# Patient Record
Sex: Female | Born: 1960 | Race: White | Hispanic: No | Marital: Single | Smoking: Former smoker
Health system: Southern US, Community
[De-identification: ages and names within clinical notes are randomized; demographics above are authoritative.]

## PROBLEM LIST (undated history)

## (undated) DIAGNOSIS — M810 Age-related osteoporosis without current pathological fracture: Secondary | ICD-10-CM

## (undated) DIAGNOSIS — M545 Low back pain: Secondary | ICD-10-CM

## (undated) DIAGNOSIS — T7840XA Allergy, unspecified, initial encounter: Secondary | ICD-10-CM

## (undated) DIAGNOSIS — K5792 Diverticulitis of intestine, part unspecified, without perforation or abscess without bleeding: Secondary | ICD-10-CM

## (undated) DIAGNOSIS — K635 Polyp of colon: Secondary | ICD-10-CM

## (undated) DIAGNOSIS — K589 Irritable bowel syndrome without diarrhea: Secondary | ICD-10-CM

## (undated) DIAGNOSIS — F32A Depression, unspecified: Secondary | ICD-10-CM

## (undated) DIAGNOSIS — F329 Major depressive disorder, single episode, unspecified: Secondary | ICD-10-CM

## (undated) HISTORY — DX: Age-related osteoporosis without current pathological fracture: M81.0

## (undated) HISTORY — PX: DILATION AND CURETTAGE OF UTERUS: SHX78

## (undated) HISTORY — DX: Low back pain: M54.5

## (undated) HISTORY — DX: Diverticulitis of intestine, part unspecified, without perforation or abscess without bleeding: K57.92

## (undated) HISTORY — DX: Depression, unspecified: F32.A

## (undated) HISTORY — DX: Allergy, unspecified, initial encounter: T78.40XA

## (undated) HISTORY — PX: NEUROMA SURGERY: SHX722

## (undated) HISTORY — DX: Polyp of colon: K63.5

## (undated) HISTORY — DX: Major depressive disorder, single episode, unspecified: F32.9

---

## 2007-09-09 ENCOUNTER — Encounter: Payer: Self-pay | Admitting: Family Medicine

## 2007-09-09 LAB — HM PAP SMEAR

## 2008-02-01 ENCOUNTER — Ambulatory Visit: Payer: Self-pay | Admitting: Occupational Medicine

## 2008-02-01 DIAGNOSIS — F329 Major depressive disorder, single episode, unspecified: Secondary | ICD-10-CM | POA: Insufficient documentation

## 2008-06-22 ENCOUNTER — Ambulatory Visit: Payer: Self-pay | Admitting: Occupational Medicine

## 2008-06-22 DIAGNOSIS — S335XXA Sprain of ligaments of lumbar spine, initial encounter: Secondary | ICD-10-CM | POA: Insufficient documentation

## 2008-06-30 ENCOUNTER — Ambulatory Visit: Payer: Self-pay | Admitting: Family Medicine

## 2008-06-30 DIAGNOSIS — J029 Acute pharyngitis, unspecified: Secondary | ICD-10-CM | POA: Insufficient documentation

## 2008-07-01 ENCOUNTER — Encounter: Payer: Self-pay | Admitting: Family Medicine

## 2009-01-27 DIAGNOSIS — D126 Benign neoplasm of colon, unspecified: Secondary | ICD-10-CM | POA: Insufficient documentation

## 2009-01-27 LAB — HM COLONOSCOPY

## 2009-03-26 ENCOUNTER — Ambulatory Visit: Payer: Self-pay | Admitting: Family Medicine

## 2009-03-26 DIAGNOSIS — J111 Influenza due to unidentified influenza virus with other respiratory manifestations: Secondary | ICD-10-CM | POA: Insufficient documentation

## 2009-08-05 ENCOUNTER — Encounter: Payer: Self-pay | Admitting: Family Medicine

## 2009-08-05 LAB — CONVERTED CEMR LAB
ALT: 19 units/L
Creatinine, Ser: 0.71 mg/dL
HCT: 42.6 %
Hemoglobin: 14 g/dL
Potassium: 4.1 meq/L

## 2009-09-11 ENCOUNTER — Ambulatory Visit: Payer: Self-pay | Admitting: Emergency Medicine

## 2009-09-11 DIAGNOSIS — M25579 Pain in unspecified ankle and joints of unspecified foot: Secondary | ICD-10-CM | POA: Insufficient documentation

## 2009-11-16 ENCOUNTER — Ambulatory Visit: Payer: Self-pay | Admitting: Family Medicine

## 2009-11-16 DIAGNOSIS — J309 Allergic rhinitis, unspecified: Secondary | ICD-10-CM | POA: Insufficient documentation

## 2009-12-23 ENCOUNTER — Encounter: Payer: Self-pay | Admitting: Family Medicine

## 2009-12-28 ENCOUNTER — Ambulatory Visit: Payer: Self-pay | Admitting: Family Medicine

## 2010-01-22 ENCOUNTER — Encounter: Payer: Self-pay | Admitting: Family Medicine

## 2010-01-22 ENCOUNTER — Ambulatory Visit
Admission: RE | Admit: 2010-01-22 | Discharge: 2010-01-22 | Payer: Self-pay | Source: Home / Self Care | Admitting: Family Medicine

## 2010-01-22 DIAGNOSIS — R109 Unspecified abdominal pain: Secondary | ICD-10-CM | POA: Insufficient documentation

## 2010-01-22 DIAGNOSIS — Z8719 Personal history of other diseases of the digestive system: Secondary | ICD-10-CM | POA: Insufficient documentation

## 2010-01-22 LAB — CONVERTED CEMR LAB
Bilirubin Urine: NEGATIVE
Glucose, Urine, Semiquant: NEGATIVE
Nitrite: NEGATIVE
Protein, U semiquant: NEGATIVE
WBC Urine, dipstick: NEGATIVE

## 2010-01-24 ENCOUNTER — Encounter: Payer: Self-pay | Admitting: Family Medicine

## 2010-01-25 ENCOUNTER — Telehealth (INDEPENDENT_AMBULATORY_CARE_PROVIDER_SITE_OTHER): Payer: Self-pay | Admitting: *Deleted

## 2010-02-04 ENCOUNTER — Ambulatory Visit: Admission: RE | Admit: 2010-02-04 | Payer: Self-pay | Source: Home / Self Care | Admitting: Family Medicine
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
## 2010-02-15 NOTE — Letter (Signed)
Summary: Out of Work  [facility]  [address]   [place], [place] [address]   Phone: [phone]  Fax: [phone]    March 26, 2009   Employee:  Alphia Kava    To Whom It May Concern:   For Medical reasons, please excuse the above named employee from work 03/29/2009.    If you need additional information, please feel free to contact our office.         Sincerely,    Donna Christen MD

## 2010-02-15 NOTE — Assessment & Plan Note (Signed)
Summary: fecer, chills, cough-greenish, sorethroat x 2 dys rm 1   Vital Signs:  Patient Profile:   50 Years Old Female CC:      Cold & URI symptoms Height:     64 inches Weight:      136 pounds O2 Sat:      100 % O2 treatment:    Room Air Temp:     101.6 degrees F oral Pulse rate:   125 / minute Pulse rhythm:   regular Resp:     16 per minute BP sitting:   131 / 84  (right arm) Cuff size:   regular  Vitals Entered By: Areta Haber CMA (March 26, 2009 6:35 PM)                  Current Allergies: No known allergies History of Present Illness Chief Complaint: Cold & URI symptoms History of Present Illness: Subjective: Patient complains of developing flu-like symptoms 3 days ago.  She had a flu shot earlier. + cough worse at night + left pleuritic pain ? wheezing + nasal congestion ? post-nasal drainage No sinus pain/pressure No itchy/red eyes No earache No hemoptysis + SOB + fever/chills No nausea No vomiting No abdominal pain No diarrhea No skin rashes + fatigue + myalgias + headache Used OTC meds without relief   Current Problems: INFLUENZA LIKE ILLNESS (ICD-487.1) ACUTE PHARYNGITIS (ICD-462) LUMBAR SPRAIN AND STRAIN (ICD-847.2) FAMILY HISTORY DEPRESSION (ICD-V17.0) FAMILY HISTORY OF ALCOHOLISM/ADDICTION (ICD-V61.41) DEPRESSION (ICD-311)   Current Meds TRAZODONE HCL 150 MG TABS (TRAZODONE HCL) 1 tab by mouth once daily WELLBUTRIN XL 300 MG XR24H-TAB (BUPROPION HCL) 1 tab by mouth once daily LEXAPRO 10 MG TABS (ESCITALOPRAM OXALATE) 1 tab by mouth once daily IBUPROFEN 200 MG TABS (IBUPROFEN) 4 or 5 tabs as needed CLARITIN-D 12 HOUR 5-120 MG XR12H-TAB (LORATADINE-PSEUDOEPHEDRINE) as directed TUSSIONEX PENNKINETIC ER 8-10 MG/5ML LQCR (CHLORPHENIRAMINE-HYDROCODONE) 5 cc by mouth hs as needed cough AZITHROMYCIN 250 MG TABS (AZITHROMYCIN) Two tabs by mouth on day 1, then 1 tab daily on days 2 through 5  REVIEW OF SYSTEMS Constitutional Symptoms    Complains of fever, chills, and fatigue.     Denies night sweats, weight loss, and weight gain.  Eyes       Denies change in vision, eye pain, eye discharge, glasses, contact lenses, and eye surgery. Ear/Nose/Throat/Mouth       Complains of frequent runny nose, sinus problems, and sore throat.      Denies hearing loss/aids, change in hearing, ear pain, ear discharge, dizziness, frequent nose bleeds, hoarseness, and tooth pain or bleeding.      Comments: greenish x 2 dys Respiratory       Complains of productive cough.      Denies dry cough, wheezing, shortness of breath, asthma, bronchitis, and emphysema/COPD.  Cardiovascular       Denies murmurs, chest pain, and tires easily with exhertion.    Gastrointestinal       Denies stomach pain, nausea/vomiting, diarrhea, constipation, blood in bowel movements, and indigestion. Genitourniary       Denies painful urination, kidney stones, and loss of urinary control. Neurological       Complains of headaches.      Denies paralysis, seizures, and fainting/blackouts. Musculoskeletal       Denies muscle pain, joint pain, joint stiffness, decreased range of motion, redness, swelling, muscle weakness, and gout.  Skin       Denies bruising, unusual mles/lumps or sores, and hair/skin or nail changes.  Psych       Denies mood changes, temper/anger issues, anxiety/stress, speech problems, depression, and sleep problems.  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  Extraocular movement is intact.  Conjunctivae are not inflamed.  Ears:  Canals normal.  Tympanic membranes normal.   Nose:  Normal septum.  Normal turbinates, mildly congested.   No sinus tenderness present.  Pharynx:  Normal; moist mucous membranes  Neck:  Supple.  No adenopathy is present.  No thyromegaly is present  Lungs:  Clear to auscultation.  Breath sounds are equal.  Heart:  Regular rate and rhythm without murmurs, rubs, or gallops.  Abdomen:  Nontender without masses or hepatosplenomegaly.  Bowel sounds are present.  No CVA or flank tenderness.  Extremities:  No edema.   Chest X-ray negative Assessment New Problems: INFLUENZA LIKE ILLNESS (ICD-487.1)  SUSPECT INFLUENZA  Plan New Medications/Changes: AZITHROMYCIN 250 MG TABS (AZITHROMYCIN) Two tabs by mouth on day 1, then 1 tab daily on days 2 through 5  #6 tabs x 0, 03/26/2009, Donna Christen MD Sandria Senter ER 8-10 MG/5ML LQCR (CHLORPHENIRAMINE-HYDROCODONE) 5 cc by mouth hs as needed cough  #2oz x 0, 03/26/2009, Donna Christen MD  New Orders: T-Chest x-ray, 2 views [[id number]] Est. Patient Level III [66440] Planning Comments:   Begin Z-pack, expectorant/decongestant daytime, cough suppressant at night, rest and increased fluids, Ibuprofen as needed. Continue Nasonex. Follow-up with PCP if not improving.   The patient and/or caregiver has been counseled thoroughly with regard to medications prescribed including dosage, schedule, interactions, rationale for use, and possible side effects and they verbalize understanding.  Diagnoses and expected course of recovery discussed and will return if not improved as expected or if the condition worsens. Patient and/or caregiver verbalized understanding.  Prescriptions: AZITHROMYCIN 250 MG TABS (AZITHROMYCIN) Two tabs by mouth on day 1, then 1 tab daily on days 2 through 5  #6 tabs x 0   Entered  and Authorized by:   Donna Christen MD   Signed by:   Donna Christen MD on 03/26/2009   Method used:   Print then Give to Patient   RxID:   [id number] TUSSIONEX PENNKINETIC ER 8-10 MG/5ML LQCR (CHLORPHENIRAMINE-HYDROCODONE) 5 cc by mouth hs as needed cough  #2oz x 0   Entered and Authorized by:   Donna Christen MD   Signed by:   Donna Christen MD on 03/26/2009   Method used:   Print then Give to Patient   RxID:   [id number]   Patient Instructions: 1)  May use Mucinex D (guaifenesin with decongestant) twice daily for congestion. 2)  Increase fluid intake, rest. 3)  Continue Nasonex 4)  May use Afrin nasal spray (or generic oxymetazoline) twice daily for about 5 days.  Also recommend using saline nasal spray several times daily and/or saline nasal irrigation. 5)  Followup with family doctor if not improving one week.

## 2010-02-15 NOTE — Letter (Signed)
Summary: Depression Questionnaire  Depression Questionnaire   Imported By: Lanelle Bal 11/29/2009 08:16:08  _____________________________________________________________________  External Attachment:    Type:   Image     Comment:   External Document

## 2010-02-15 NOTE — Assessment & Plan Note (Signed)
Summary: NOV depression   Vital Signs:  Patient profile:   50 year old female Height:      64 inches Weight:      140 pounds BMI:     24.12 O2 Sat:      97 % on Room air Temp:     97.8 degrees F oral Pulse rate:   64 / minute BP sitting:   120 / 72  (left arm) Cuff size:   regular  Vitals Entered By: Payton Spark CMA (November 16, 2009 2:59 PM)  O2 Flow:  Room air CC: New to est. Discuss depression and other medical concerns.    Primary Care Provider:  Seymour Bars DO  CC:  New to est. Discuss depression and other medical concerns. .  History of Present Illness: 50 yo WF presents for NOV.  She moved here from [place].  She has a hx of depression.  She is perimenopausal.    She was on Zoloft in her 30s then was on Celexa and then Wellbutrin was added and Celexa was changed to Lexapro.  She is on Trazadone at bedtime.  She feels like she has blunted emotional response, no energy and anhedonia.  Does no exercise.  Declined need for counseling and has no acute stressors.  Denies suicidal ideations.    Current Medications (verified): 1)  Trazodone Hcl 150 Mg Tabs (Trazodone Hcl) .Marland Kitchen.. 1 Tab By Mouth Once Daily 2)  Wellbutrin Xl 300 Mg Xr24h-Tab (Bupropion Hcl) .Marland Kitchen.. 1 Tab By Mouth Once Daily 3)  Lexapro 10 Mg Tabs (Escitalopram Oxalate) .Marland Kitchen.. 1 Tab By Mouth Once Daily 4)  Ibuprofen 200 Mg Tabs (Ibuprofen) .... 4 or 5 Tabs As Needed 5)  Claritin-D 12 Hour 5-120 Mg Xr12h-Tab (Loratadine-Pseudoephedrine) .... As Directed 6)  Nasonex 50 Mcg/act Susp (Mometasone Furoate)  Allergies (verified): 1)  ! 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 Former Smoker Alcohol  use-yes Drug use-no Excercise: none  Review of Systems       no fevers/sweats/weakness, unexplained wt loss/gain, no change in vision, no difficulty hearing, ringing in ears, no hay fever/allergies, no CP/discomfort, no palpitations, no breast lump/nipple discharge, no cough/wheeze, no blood in stool,no  N/V/D, no nocturia, no leaking urine, no unusual vag bleeding, no vaginal/penile discharge, no muscle/joint pain, no rash, no new/changing mole, no HA, no memory loss, no anxiety, no sleep problem, no depression, no unexplained lumps, no easy bruising/bleeding., no concern with sexual function    Physical Exam  General:  alert, well-developed, well-nourished, and well-hydrated.   Head:  normocephalic and atraumatic.   Eyes:  conjunctiva clear; wears glasses Ears:  EACs patent; TMs translucent and gray with good cone of light and bony landmarks.  Nose:  no nasal discharge.   Mouth:  pharynx pink and moist.   Neck:  no masses.   Lungs:  Normal respiratory effort, chest expands symmetrically. Lungs are clear to auscultation, no crackles or wheezes. Heart:  Normal rate and regular rhythm. S1 and S2 normal without gallop, murmur, click, rub or other extra sounds. Extremities:  no LE edema Psych:  good eye contact, not anxious appearing, and flat affect.     Impression & Recommendations:  Problem # 1:  DEPRESSION (ICD-311) PHQ score of 14.  Room for improvement.  Will taper off Lexapro and start on Cymbalta, 30 mg / day samples given for wk 1 then go up to 60 mg/ day.  Call if any problems including suicidal ideations/ worsening depression.  Continue Trazadone and Wellbutrin.  RTC in 6 wks.  Work on increasing exercise.   Her updated medication list for this problem includes:    Trazodone Hcl 150 Mg Tabs (Trazodone hcl) .Marland Kitchen... 1 tab by mouth once daily    Wellbutrin Xl 300 Mg Xr24h-tab (Bupropion hcl) .Marland Kitchen... 1 tab by mouth once daily    Cymbalta 60 Mg Cpep (Duloxetine hcl) .Marland Kitchen... 1 capsule by  mouth daily  Problem # 2:  ALLERGIC RHINITIS (ICD-477.9) [facility].  Previously seen by allergist in [place]. Her updated medication list for this problem includes:    Nasonex 50 Mcg/act Susp (Mometasone furoate)  Orders: Allergy Referral  (Allergy)  Complete Medication List: 1)  Trazodone Hcl 150 Mg Tabs (Trazodone hcl) .Marland Kitchen.. 1 tab by mouth once daily 2)  Wellbutrin Xl 300 Mg Xr24h-tab (Bupropion hcl) .Marland Kitchen.. 1 tab by mouth once daily 3)  Cymbalta 60 Mg Cpep (Duloxetine hcl) .Marland Kitchen.. 1 capsule by mouth daily 4)  Ibuprofen 200 Mg Tabs (Ibuprofen) .... 4 or 5 tabs as needed 5)  Claritin-d 12 Hour 5-120 Mg Xr12h-tab (Loratadine-pseudoephedrine) .... As directed 6)  Nasonex 50 Mcg/act Susp (Mometasone furoate)  Patient Instructions: 1)  Cut Lexapro in 1/2. 2)  Take 1/2 tab once daily x 3 days then STOP. 3)  Once OFF, then start on CYMBALTA 30 mg once daily x 1 wk then go up to 60 mg once daily dose. 4)  Will set you up with [facility] here. 5)  Return for f/u mood/ fasting labs in 6 wks. Prescriptions: CYMBALTA 60 MG CPEP (DULOXETINE HCL) 1 capsule by mouth daily  #30 x 3   Entered and Authorized by:   Seymour Bars DO   Signed by:   Seymour Bars DO on 11/16/2009   Method used:   Electronically to        [facility]. [phone]* (retail)       [address]       [place], [place]  [address]       Ph: [phone]       Fax: [phone]   RxID:   [id number]    Orders Added: 1)  Allergy Referral  [Allergy] 2)  New Patient Level III [phone]

## 2010-02-16 ENCOUNTER — Encounter: Payer: Self-pay | Admitting: Family Medicine

## 2010-02-16 DIAGNOSIS — F411 Generalized anxiety disorder: Secondary | ICD-10-CM | POA: Insufficient documentation

## 2010-02-16 DIAGNOSIS — R8789 Other abnormal findings in specimens from female genital organs: Secondary | ICD-10-CM | POA: Insufficient documentation

## 2010-02-17 NOTE — Assessment & Plan Note (Signed)
Summary: Abd Pain/TM Room 5   Vital Signs:  Patient Profile:   50 Years Old Female CC:      Abdominal Pain and diarrhea x 2 days LMP:     01/22/2010 Height:     64 inches Weight:      140 pounds O2 Sat:      98 % O2 treatment:    Room Air Temp:     98.1 degrees F oral Pulse rate:   73 / minute Pulse rhythm:   regular Resp:     12 per minute BP sitting:   94 / 66  (left arm) Cuff size:   regular  Vitals Entered By: Emilio Math (January 22, 2010 9:45 AM)  Menstrual History: LMP (date): 01/22/2010                  Current Allergies: ! * ENVIRONMENTAL ! ZYRTEC ALLERGY ! * NASONEXHistory of Present Illness Chief Complaint: Abdominal Pain and diarrhea x 2 days History of Present Illness:  Subjective:  Patient has a history of recurring episodes of diverticulitis over the past 14 years (she had a colonoscopy last year that verified; also had polyps).  About 2 weeks ago she had a typical flare-up of abdominal pain that she attributed to diverticulits, and it resolved with symptomatic treatment.  Yesterday she developed recurrent mild abdominal pain radiating to the left back, and had watery diarrhea last night.  No blood in stool.  Over the past 2 weeks she has had diarrhea alternating with constipation.   She has had chills.  She has had mild nausea but no vomiting.  No resp or GU symptoms.  No vaginal discharge.  REVIEW OF SYSTEMS Constitutional Symptoms      Denies fever, chills, night sweats, weight loss, weight gain, and fatigue.  Eyes       Denies change in vision, eye pain, eye discharge, glasses, contact lenses, and eye surgery. Ear/Nose/Throat/Mouth       Denies hearing loss/aids, change in hearing, ear pain, ear discharge, dizziness, frequent runny nose, frequent nose bleeds, sinus problems, sore throat, hoarseness, and tooth pain or bleeding.  Respiratory       Denies dry cough, productive cough, wheezing, shortness of breath, asthma, bronchitis, and emphysema/COPD.   Cardiovascular       Denies murmurs, chest pain, and tires easily with exhertion.    Gastrointestinal       Complains of stomach pain and diarrhea.      Denies nausea/vomiting, constipation, blood in bowel movements, and indigestion. Genitourniary       Denies painful urination, kidney stones, and loss of urinary control. Neurological       Denies paralysis, seizures, and fainting/blackouts. Musculoskeletal       Denies muscle pain, joint pain, joint stiffness, decreased range of motion, redness, swelling, muscle weakness, and gout.  Skin       Denies bruising, unusual mles/lumps or sores, and hair/skin or nail changes.  Psych       Denies mood changes, temper/anger issues, anxiety/stress, speech problems, depression, and sleep problems.  Past History:  Past Medical History: Depression Allergies Diverticulitis, hx of  Past Surgical History: Reviewed history from 11/16/2009 and no changes required. 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 Mother died from ETOHism Father CVA at 52; HTN; AMI at 42 brother healthy sister healthy aunt ? cancer  Social History: Reviewed history from 11/16/2009 and no changes required. 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 Former Smoker Alcohol use-yes Drug use-no Excercise: none   Objective:  Appearance:  Patient appears healthy, stated age, and in no acute distress  Eyes:  Pupils are equal, round, and reactive to light and accomdation.  Extraocular movement is intact.  Conjunctivae are not inflamed.   Pharynx:  Normal, moist mucous membranes  Neck:  Supple.  No adenopathy is present.  No thyromegaly is present  Lungs:  Clear to auscultation.  Breath sounds are equal.  Heart:  Regular rate and rhythm without murmurs, rubs, or gallops.  Abdomen:  Vague tenderness over colon without masses or hepatosplenomegaly.  Bowel sounds are  present.  No CVA or flank tenderness.  Mild tenderness over McBurney's point.  Negative iliopsoas and obdurator tests. Extremities:  No edema.  Pedal pulses are full and equal.  Skin:  No rash  urinalysis (dipstick):  2+ blood CBC:  WBC 3.9, normal diff; Hgb 14.0 Assessment New Problems: ABDOMINAL PAIN (ICD-789.00) DIVERTICULITIS, HX OF (ICD-V12.79)  ALTHOUGH WBC NORMAL, WILL TREAT EMPIRICALLY FOR DIVERTICULITIS  Plan New Medications/Changes: METRONIDAZOLE 500 MG TABS (METRONIDAZOLE) One by mouth q6hr  #28 x 0, 01/22/2010, Donna Christen MD CIPROFLOXACIN HCL 750 MG TABS (CIPROFLOXACIN HCL) One by mouth two times a day  #14 x 0, 01/22/2010, Donna Christen MD  New Orders: CBC [[id number]] Urinalysis [CPT-81003] Est. Patient Level IV [09811] Planning Comments:   Begin clear liquids for 12 to 18 hours then slowly advance diet.  Begin metronidazole and cipro.   Return for worsening symptoms  Follow-up with GI if not improving.   The patient and/or caregiver has been counseled thoroughly with regard to medications prescribed including dosage, schedule, interactions, rationale for use, and possible side effects and they verbalize understanding.  Diagnoses and expected course of recovery discussed and will return if not improved as expected or if the condition worsens. Patient and/or caregiver verbalized understanding.  Prescriptions: METRONIDAZOLE 500 MG TABS (METRONIDAZOLE) One by mouth q6hr  #28 x 0   Entered and Authorized by:   Donna Christen MD   Signed by:   Donna Christen MD on 01/22/2010   Method used:   Print then Give to Patient   RxID:   [id number] CIPROFLOXACIN HCL 750 MG TABS (CIPROFLOXACIN HCL) One by mouth two times a day  #14 x 0   Entered and Authorized by:   Donna Christen MD   Signed by:   Donna Christen MD on 01/22/2010   Method used:   Print then Give to Patient   RxID:   [id number]   Orders Added: 1)  CBC [[id number]] 2)  Urinalysis [CPT-81003] 3)   Est. Patient Level IV [64403]    Laboratory Results   Urine Tests  Date/Time Received: January 22, 2010 10:37 AM  Date/Time Reported: January 22, 2010 10:37 AM   Routine Urinalysis   Color: yellow Appearance: Clear Glucose: negative   (Normal Range: Negative) Bilirubin: negative   (Normal Range: Negative) Ketone: negative   (Normal Range: Negative) Spec. Gravity: 1.020   (Normal Range: 1.003-1.035) Blood: 2+   (Normal Range: Negative) pH: 6.0   (Normal Range: 5.0-8.0) Protein: negative   (Normal Range: Negative) Urobilinogen: 0.2   (Normal Range: 0-1) Nitrite: negative   (Normal Range: Negative) Leukocyte Esterace: negative   (Normal Range: Negative)    Comments: menses began today

## 2010-02-17 NOTE — Consult Note (Signed)
Summary: [employer] [facility] [employer] [facility] [place]   Imported By: Lanelle Bal 01/07/2010 08:44:21  _____________________________________________________________________  External Attachment:    Type:   Image     Comment:   External Document

## 2010-02-17 NOTE — Progress Notes (Signed)
  Phone Note Outgoing Call Call back at [facility] Phone [phone]   Call placed by: Lajean Saver RN,  January 25, 2010 8:48 AM Call placed to: Patient Action Taken: Phone Call Completed Summary of Call: Callback: Patient reports that her abdominal pain has subsided but she is still having diarrhea. She is still taking her antibiotics. I encouraged her to try and eat before takiong the antibiotic. Advance diet slowly though and drink plenty of water. I also suggested she try immodium or pepto. Call us with any further problems.

## 2010-02-17 NOTE — Letter (Signed)
Summary: Handout Printed  Printed Handout:  - Clear Liquid Diet-Brief 

## 2010-02-18 ENCOUNTER — Telehealth (INDEPENDENT_AMBULATORY_CARE_PROVIDER_SITE_OTHER): Payer: Self-pay | Admitting: *Deleted

## 2010-02-21 ENCOUNTER — Encounter: Payer: Self-pay | Admitting: Family Medicine

## 2010-02-21 ENCOUNTER — Ambulatory Visit (INDEPENDENT_AMBULATORY_CARE_PROVIDER_SITE_OTHER): Payer: BC Managed Care – PPO | Admitting: Family Medicine

## 2010-02-21 DIAGNOSIS — R197 Diarrhea, unspecified: Secondary | ICD-10-CM | POA: Insufficient documentation

## 2010-02-21 DIAGNOSIS — Z8719 Personal history of other diseases of the digestive system: Secondary | ICD-10-CM

## 2010-02-22 ENCOUNTER — Encounter: Payer: Self-pay | Admitting: Family Medicine

## 2010-02-22 LAB — CONVERTED CEMR LAB
Albumin: 4.4 g/dL (ref 3.5–5.2)
BUN: 9 mg/dL (ref 6–23)
Calcium: 9.9 mg/dL (ref 8.4–10.5)
Chloride: 105 meq/L (ref 96–112)
Lipase: 26 units/L (ref 0–75)
Lymphocytes Relative: 25 % (ref 12–46)
Lymphs Abs: 1.9 10*3/uL (ref 0.7–4.0)
MCHC: 35.1 g/dL (ref 30.0–36.0)
MCV: 92.4 fL (ref 78.0–100.0)
Monocytes Relative: 7 % (ref 3–12)
Neutrophils Relative %: 66 % (ref 43–77)
Platelets: 318 10*3/uL (ref 150–400)
Potassium: 4.4 meq/L (ref 3.5–5.3)
RBC: 4.6 M/uL (ref 3.87–5.11)
RDW: 13.4 % (ref 11.5–15.5)
Sodium: 143 meq/L (ref 135–145)
TSH: 2.78 microintl units/mL (ref 0.350–4.500)
Total Protein: 6.9 g/dL (ref 6.0–8.3)

## 2010-02-23 NOTE — Miscellaneous (Signed)
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) Added new problem of GENERALIZED ANXIETY DISORDER (ICD-300.02) Added new problem of PAP SMEAR, LGSIL, ABNORMAL (ICD-795.09) Observations: Added new observation of PRIMARY MD: Seymour Bars DO (02/16/2010 10:42) Added new observation of PAP DUE: 09/08/2008 (02/16/2010 10:42) Added new observation of SGPT (ALT): 19 units/L (08/05/2009 10:42) Added new observation of SGOT (AST): 14 units/L (08/05/2009 10:42) Added new observation of CREATININE: 0.71 mg/dL (82/95/6213 08:65) Added new observation of BUN: 11 mg/dL (78/46/9629 52:84) Added new observation of BG RANDOM: 81 mg/dL (13/24/4010 27:25) Added new observation of K SERUM: 4.1 meq/L (08/05/2009 10:42) Added new observation of PLATELETK/UL: 263 K/uL (08/05/2009 10:42) Added new observation of HCT: 42.6 % (08/05/2009 10:42) Added new observation of HGB: 14 g/dL (36/64/4034 74:25) Added new observation of WBC COUNT: 5.5 10*3/microliter (08/05/2009 10:42) Added new observation of COLONOSCOPY: [facility]  diverticulosis tortuous sigmoid colon transverse colon polyps  (01/27/2009 10:45) Added new observation of LAST PAP DAT: 09/09/2007  (09/09/2007 10:44) Added new observation of PAP SMEAR: normal  (09/09/2007 10:44)     PAP Result Date:  09/09/2007 PAP Result:  normal PAP Next Due:  1 yr    Colonoscopy  Procedure date:  01/27/2009  Findings:      [facility]  diverticulosis tortuous sigmoid colon transverse colon polyps   Colonoscopy  Procedure date:  01/27/2009  Findings:      [facility]  diverticulosis tortuous sigmoid colon transverse colon polyps

## 2010-02-28 ENCOUNTER — Encounter: Payer: Self-pay | Admitting: Family Medicine

## 2010-03-03 NOTE — Progress Notes (Signed)
Summary: [facility] Refill  Phone Note Call from Patient Call back at Home Phone [phone]   Caller: Patient Reason for Call: Referral Summary of Call: 2 issues:  1) pt would like referral to GI for stomach issues/abdominal pain. 2) would like refill on trazadone.  Ran med history since 12/1 and pt has not had filled in the last two months. Initial call taken by: Francee Piccolo CMA Duncan Dull),  February 18, 2010 3:12 PM  Follow-up for Phone Call        I have not seen her for Abd pain, UC did.  I said in Dr Rolla Plate note that she already has a GI doc -- does she?    Additional Follow-up for Phone Call Additional follow up Details #1::        Pt has GI doc in [place] and had colon last year.  She would like MD closer to home. Francee Piccolo CMA Duncan Dull)  February 18, 2010 4:24 PM   LM to [facility] at home number--need previous GI MD name, and ? if pt wants to schedule appt. Francee Piccolo CMA Duncan Dull)  February 18, 2010 4:30 PM      Prescriptions: TRAZODONE HCL 150 MG TABS (TRAZODONE HCL) 1 tab by mouth once daily  #30 x 3   Entered and Authorized by:   Seymour Bars DO   Signed by:   Seymour Bars DO on 02/18/2010   Method used:   Electronically to        [facility]. [phone]* (retail)       [address]       [place], [place]  [address]       Ph: [phone]       Fax: [phone]   RxID:   [id number]

## 2010-03-03 NOTE — Assessment & Plan Note (Signed)
Summary: GI issues   Vital Signs:  Patient profile:   50 year old female Height:      64 inches Weight:      139 pounds BMI:     23.95 O2 Sat:      100 % on Room air Temp:     98.2 degrees F oral Pulse rate:   67 / minute BP sitting:   142 / 79  (left arm) Cuff size:   regular  Vitals Entered By: Payton Spark CMA (February 21, 2010 2:56 PM)  O2 Flow:  Room air CC: Epigastric pain, stomach cramps and diarrhea x 1+ months.   Primary Care Provider:  Seymour Bars DO  CC:  Epigastric pain and stomach cramps and diarrhea x 1+ months..  History of Present Illness: 50 yo WF presents for mostly diarrhea this past month.  She had a colonoscopy done at [facility] last year and she had diverticulosis and polyps at the time.  She does not see the GI doc there for f/u.    She was treated for presumed diverticulitis at UC last month with Cipro and Flagyl but it did not help.  She took Some Immodium and some Ibuprofen but this didn't do much.  She has had alternating urgency with her diarrhea.  Her stools have been mostly watery and w/o blood.  She has a lot of diffuse cramping but not much bloating.  Had some fevers and chills.  She lost about 11 lbs during that time.  Her appetitie has been normal.  No nausea associated with the diarrhea.      Allergies: 1)  ! * Environmental 2)  ! Zyrtec Allergy 3)  ! * Nasonex  Physical Exam  General:  alert, well-developed, well-nourished, and well-hydrated.   Head:  normocephalic and atraumatic.   Eyes:  sclera non icteric Mouth:  pharynx pink and moist.   Neck:  no masses.  thyroid slightly TTP but no enlarged Lungs:  Normal respiratory effort, chest expands symmetrically. Lungs are clear to auscultation, no crackles or wheezes. Heart:  Normal rate and regular rhythm. S1 and S2 normal without gallop, murmur, click, rub or other extra sounds. Abdomen:  soft.  hyperactive BS with slight distension.  No HSM.  Neg Murphys sign.  R and L LQs  TTP Extremities:  no LE edema Skin:  color normal.  no jaundice, pallor or diaphoresis Psych:  good eye contact, not anxious appearing, and not depressed appearing.     Impression & Recommendations:  Problem # 1:  DIARRHEA (ICD-787.91) >1 month of diarrhea/ bloating suggests ddx of IBS, gluten intolerance, IBD or lactose intolerance.  Unlikely parasititc given lack of exposure and continued good appetite.  Will check labs today  to rule out other causes for diarrhea.  In the meantime, stick to a bland diet, avoiding dairy and will get her set up to see GI.   Orders: Gastroenterology Referral (GI) T-TSH [phone])  Problem # 2:  DIVERTICULITIS, HX OF (ICD-V12.79) Apparently, she was treated for typical acute diverticulitis in Dec while visiting her brother out of town and was treated again in Jan by UC but did not improve the 2nd time.  She had a low WBC at UC.  Will recheck today.  GI referral made given frequent occurences.  Has not required bowel resection thus far.   Orders: Gastroenterology Referral (GI)  Complete Medication List: 1)  Trazodone Hcl 150 Mg Tabs (Trazodone hcl) .Marland Kitchen.. 1 tab by mouth once daily 2)  Ibuprofen 200 Mg Tabs (Ibuprofen) .... 4 or 5 tabs as needed 3)  Nasonex 50 Mcg/act Susp (Mometasone furoate) 4)  Methocarbamol 500 Mg Tabs (Methocarbamol) .... Prn  Other Orders: T-CBC w/Diff ([id number]) T-Comprehensive Metabolic Panel [phone]) T-Lipase [id number])  Patient Instructions: 1)  Labs today. 2)  Will call you w/ results tomorrow. 3)  Will get you in with [facility] for GI visit. 4)  Stick to a bland diet -- avoid any milk products for now. 5)  Use OTC Kaopectate as needed for diarrhea.   6)  REturn for f/u in 1 month.   Orders Added: 1)  T-CBC w/Diff [[id number]] 2)  T-Comprehensive Metabolic Panel [[id number]] 3)  T-Lipase [[id number]] 4)  Gastroenterology Referral [GI] 5)  T-TSH [[id number]] 6)  Est. Patient Level  III [28315]

## 2010-03-07 ENCOUNTER — Encounter: Payer: Self-pay | Admitting: Family Medicine

## 2010-03-08 ENCOUNTER — Encounter: Payer: Self-pay | Admitting: Family Medicine

## 2010-03-09 NOTE — Letter (Signed)
Summary: [facility]   Imported By: Kassie Mends 03/02/2010 09:44:13  _____________________________________________________________________  External Attachment:    Type:   Image     Comment:   External Document

## 2010-03-13 ENCOUNTER — Encounter: Payer: Self-pay | Admitting: Family Medicine

## 2010-03-23 ENCOUNTER — Ambulatory Visit: Payer: BC Managed Care – PPO | Admitting: Family Medicine

## 2010-03-24 NOTE — Letter (Signed)
Summary: [facility]   [facility]   Imported By: Kassie Mends 03/17/2010 09:06:33  _____________________________________________________________________  External Attachment:    Type:   Image     Comment:   External Document

## 2010-03-24 NOTE — Letter (Signed)
Summary: [facility]  [facility]   Imported By: Kassie Mends 03/17/2010 10:17:03  _____________________________________________________________________  External Attachment:    Type:   Image     Comment:   External Document

## 2010-03-25 ENCOUNTER — Telehealth: Payer: Self-pay | Admitting: Family Medicine

## 2010-03-29 NOTE — Progress Notes (Signed)
Summary: C-Diff questions  Phone Note Call from Patient Call back at Home Phone [phone]   Caller: Patient Summary of Call: Pt calls with questions regarding C-Diff and meds given by "that other doctor".  RC to pt.  She is in a meeting and will need to call me back later.  Advised to call [phone] until 12p, call [place] office after that time.  Pt agreeable. Initial call taken by: Francee Piccolo CMA Duncan Dull),  March 25, 2010 10:10 AM  Follow-up for Phone Call        pt RC, states she called GI doc for refills and does not need refill from our office. Follow-up by: Francee Piccolo CMA Duncan Dull),  March 25, 2010 10:48 AM

## 2010-03-31 ENCOUNTER — Encounter: Payer: Self-pay | Admitting: Family Medicine

## 2010-04-05 NOTE — Letter (Signed)
Summary: Stool Studies Results Letter/[facility]  Stool Studies Results Letter/[facility]   Imported By: Maryln Gottron 03/29/2010 10:52:58  _____________________________________________________________________  External Attachment:    Type:   Image     Comment:   External Document

## 2010-04-11 ENCOUNTER — Encounter: Payer: Self-pay | Admitting: Family Medicine

## 2010-06-23 ENCOUNTER — Other Ambulatory Visit: Payer: Self-pay | Admitting: *Deleted

## 2010-06-23 MED ORDER — MOMETASONE FUROATE 50 MCG/ACT NA SUSP: 2.0000 | Freq: Every day | NASAL | Status: DC

## 2010-06-23 MED ORDER — TRAZODONE HCL 150 MG PO TABS: 150.0000 mg | ORAL_TABLET | Freq: Every day | ORAL | Status: DC

## 2010-07-05 ENCOUNTER — Other Ambulatory Visit (HOSPITAL_COMMUNITY)
Admission: RE | Admit: 2010-07-05 | Discharge: 2010-07-05 | Disposition: A | Discharge status: Home / Self Care | Payer: BC Managed Care – PPO | Source: Ambulatory Visit | Attending: Family Medicine | Admitting: Family Medicine

## 2010-07-05 ENCOUNTER — Ambulatory Visit (INDEPENDENT_AMBULATORY_CARE_PROVIDER_SITE_OTHER): Payer: BC Managed Care – PPO | Admitting: Family Medicine

## 2010-07-05 ENCOUNTER — Encounter: Payer: Self-pay | Admitting: Family Medicine

## 2010-07-05 VITALS — BP 117/79 | HR 68 | Ht 64.0 in | Wt 143.0 lb

## 2010-07-05 DIAGNOSIS — Z13220 Encounter for screening for lipoid disorders: Secondary | ICD-10-CM

## 2010-07-05 DIAGNOSIS — Z1231 Encounter for screening mammogram for malignant neoplasm of breast: Secondary | ICD-10-CM

## 2010-07-05 DIAGNOSIS — Z01419 Encounter for gynecological examination (general) (routine) without abnormal findings: Secondary | ICD-10-CM

## 2010-07-05 NOTE — Progress Notes (Signed)
  Subjective:    Patient ID: Shirley Moody, female    DOB: 1960-08-08, 50 y.o.   MRN: [id number]  HPI  50 yo WF presents for CPE with pap smear.  She has hx of abnormal pap but no dysplasia.  Her last mammogram was > 1 yr ago.  Her Tetanus vaccine is UTD.  Her periods have spaced out with perimenopause.  She was on bioidentical hormones for some time. She did well on them and would like to restart.  She is not a smoker, has no hx of heart dz or stroke.  Mammogram and pap smear are due though. Her LMP was in Feb.  She is not on anything for birth control.  She is married.  No fam hx of colon cancer.  Her dad had a stroke and bypass surgery in his 36s.  She had a colonoscopy in 2011 and routinely follows with GI after having C. Diff last year.   BP 117/79  Pulse 68  Ht 5\' 4"  (1.626 m)  Wt 143 lb (64.864 kg)  BMI 24.55 kg/m2  SpO2 96%  LMP 03/17/2010    Review of Systems  Constitutional: Negative for fatigue and unexpected weight change.  Eyes: Negative for visual disturbance.  Respiratory: Negative for shortness of breath.   Cardiovascular: Negative for chest pain, palpitations and leg swelling.  Gastrointestinal: Negative for nausea, vomiting, abdominal pain, diarrhea, constipation and blood in stool.  Genitourinary: Negative for difficulty urinating.  Musculoskeletal: Negative for arthralgias.  Neurological: Negative for headaches.  Psychiatric/Behavioral: Negative for sleep disturbance and dysphoric mood. The patient is not nervous/anxious.        Objective:   Physical Exam  Constitutional: She appears well-developed and well-nourished. No distress.  HENT:  Nose: Nose normal.  Mouth/Throat: Oropharynx is clear and moist.  Eyes: Conjunctivae are normal.  Neck: Neck supple. No thyromegaly present.  Cardiovascular: Normal rate, regular rhythm and normal heart sounds.   No murmur heard. Pulmonary/Chest: Effort normal and breath sounds normal.  Abdominal: Soft. Bowel sounds are  normal. She exhibits no distension. There is no tenderness. There is no guarding.  Genitourinary: Vagina normal and uterus normal. No breast swelling, tenderness or discharge. No vaginal discharge found.       Small nabothian cyst at 9 o'clock on the cervix  Musculoskeletal: She exhibits no edema.  Lymphadenopathy:    She has no cervical adenopathy.  Skin: Skin is warm and dry.  Psychiatric: She has a normal mood and affect.          Assessment & Plan:  Assesment:  1. CPE- Keeping healthy checklist for women reviewed today.  BP at goal.  BMI 24 at goal.     Labs ordered- FLP the only one that was due. Colonoscopy- done 2011 per GI Contraception- declined. Last pap-updated today. Mammogram- order placed Encouraged healthy diet, regular exercise, MVI daily, calcium/ Vit D daily. Return for next physical in 1 yr.

## 2010-07-05 NOTE — Patient Instructions (Signed)
Update cholesterol today. Will call you w/ results tomorrow.  Update pap smear today.  Will call w/ results next wk.

## 2010-07-06 ENCOUNTER — Telehealth: Payer: Self-pay | Admitting: Family Medicine

## 2010-07-06 LAB — LIPID PANEL
LDL Cholesterol: 96 mg/dL (ref 0–99)
Total CHOL/HDL Ratio: 2.7 Ratio
VLDL: 14 mg/dL (ref 0–40)

## 2010-07-06 NOTE — Telephone Encounter (Signed)
Pls let pt know that her cholesterol looks great.

## 2010-07-06 NOTE — Telephone Encounter (Signed)
Pt aware of the above  

## 2010-07-08 ENCOUNTER — Telehealth: Payer: Self-pay | Admitting: Family Medicine

## 2010-07-08 NOTE — Telephone Encounter (Signed)
Pt is interesting in discussing whether or not the provider has given any thought to the possibility of ovarian cyst vs, ovarian CA.  Please advise.  Would it be worth checking her for?? Shirley Newcomer, LPN Domingo Dimes

## 2010-07-10 NOTE — Telephone Encounter (Signed)
Is she having pelvic pain?  I don't have any documentation for a fam hx of ovarian cancer or symptoms to suggest this.

## 2010-07-12 ENCOUNTER — Telehealth: Payer: Self-pay | Admitting: Family Medicine

## 2010-07-12 ENCOUNTER — Other Ambulatory Visit: Payer: Self-pay | Admitting: Family Medicine

## 2010-07-12 ENCOUNTER — Ambulatory Visit
Admission: RE | Admit: 2010-07-12 | Discharge: 2010-07-12 | Disposition: A | Discharge status: Home / Self Care | Payer: BC Managed Care – PPO | Source: Ambulatory Visit | Attending: Family Medicine | Admitting: Family Medicine

## 2010-07-12 DIAGNOSIS — Z1231 Encounter for screening mammogram for malignant neoplasm of breast: Secondary | ICD-10-CM

## 2010-07-12 DIAGNOSIS — R102 Pelvic and perineal pain: Secondary | ICD-10-CM

## 2010-07-12 NOTE — Telephone Encounter (Signed)
Pt called back and stated she does not have a family history of ovarian cancers or any other cancers.  Pt scheduled herself an u/s for tomorrow morning 09:45 am and wants to know if she should keep the appt.  She currently is not having pelvic pain even though she has had pelvic pain in the past.   Plan:  Routed to Dr. Arlice Colt, LPN [facility] Dimes

## 2010-07-12 NOTE — Telephone Encounter (Signed)
I reviewed her chart.  She had a CT abd/ pelvis in Feb 2012.  I do think we should proceed with a transvaginal u/s to look at her ovaries first.  I will order a urine test and a tumor marker to be done at the lab.  These will need to be done before oncology will see her.

## 2010-07-12 NOTE — Telephone Encounter (Signed)
Pt wants a referral to oncologist. Pt has been having stomach issues lately, and seeing GI specialist since 01/2010.  Dx IBS.  Pt would like to be checked for ovarian cancer.  Lots abd pain upper stomach, bloating, urinates a lot.  Has a friend that had same symptoms, and she has been encouraged to get checked for this. Please advise since pt just had a pap smear... Routed to Dr. Arlice Colt, LPN [facility] Dimes

## 2010-07-12 NOTE — Telephone Encounter (Signed)
LMOM informing Pt of the above 

## 2010-07-12 NOTE — Telephone Encounter (Signed)
Pt notified.  Had to [facility] to call and give detailed infor if having pelvic pain or if there is any family hx of ovarian cancer or cysts??? Pending pt callback.Jarvis Newcomer, LPN Dom[facility] Dimes

## 2010-07-12 NOTE — Telephone Encounter (Signed)
See other note.  I had already placed order for non OB transvag u/s and bloodwork.

## 2010-07-13 ENCOUNTER — Ambulatory Visit
Admission: RE | Admit: 2010-07-13 | Discharge: 2010-07-13 | Disposition: A | Discharge status: Home / Self Care | Payer: BC Managed Care – PPO | Source: Ambulatory Visit | Attending: Family Medicine | Admitting: Family Medicine

## 2010-07-13 ENCOUNTER — Telehealth: Payer: Self-pay | Admitting: Family Medicine

## 2010-07-13 DIAGNOSIS — R102 Pelvic and perineal pain: Secondary | ICD-10-CM

## 2010-07-13 LAB — CBC WITH DIFFERENTIAL/PLATELET
Basophils Absolute: 0 10*3/uL (ref 0.0–0.1)
Basophils Relative: 0 % (ref 0–1)
Eosinophils Absolute: 0.2 10*3/uL (ref 0.0–0.7)
Eosinophils Relative: 3 % (ref 0–5)
HCT: 43.8 % (ref 36.0–46.0)
Hemoglobin: 14.9 g/dL (ref 12.0–15.0)
Lymphocytes Relative: 26 % (ref 12–46)
Lymphs Abs: 1.4 10*3/uL (ref 0.7–4.0)
MCH: 32 pg (ref 26.0–34.0)
MCHC: 34 g/dL (ref 30.0–36.0)
MCV: 94.2 fL (ref 78.0–100.0)
Monocytes Absolute: 0.6 10*3/uL (ref 0.1–1.0)
Monocytes Relative: 10 % (ref 3–12)
Neutro Abs: 3.4 10*3/uL (ref 1.7–7.7)
Neutrophils Relative %: 61 % (ref 43–77)
Platelets: 278 10*3/uL (ref 150–400)
RBC: 4.65 MIL/uL (ref 3.87–5.11)
RDW: 13.5 % (ref 11.5–15.5)
WBC: 5.6 10*3/uL (ref 4.0–10.5)

## 2010-07-13 LAB — URINALYSIS, ROUTINE W REFLEX MICROSCOPIC
Hgb urine dipstick: NEGATIVE
Leukocytes, UA: NEGATIVE
Protein, ur: NEGATIVE mg/dL
Urobilinogen, UA: 1 mg/dL (ref 0.0–1.0)

## 2010-07-13 LAB — CA 125: CA 125: 8.1 U/mL (ref 0.0–30.2)

## 2010-07-13 NOTE — Telephone Encounter (Signed)
Pls let pt know that her CA-125 (marker for ovarian cancer) is normal.  Blood counts and UA are also normal.  Awaiting u/s results.

## 2010-07-13 NOTE — Telephone Encounter (Signed)
Pls make sure pt know that she will need to go back for a diagnostic mammogram.

## 2010-07-13 NOTE — Telephone Encounter (Signed)
Pt informed that an order for non OB transvaginal U/S had been placed alng with labwork. Plan:  Had to [facility] for the patient to call when getting this message.  Jarvis Newcomer, LPN Domingo Dimes

## 2010-07-13 NOTE — Telephone Encounter (Signed)
Pls let pt know that she has a cyst on her R ovary on u/s.  We have 2 options:  Have gyn review this with her or try to let her in with oncology (though they may want her to go thru gyn - onc in which case, she will have to see gyn first).  Many oncologists will require an actual tissue diagnosis of cancer before they will see a patient.

## 2010-07-14 ENCOUNTER — Telehealth: Payer: Self-pay | Admitting: Family Medicine

## 2010-07-14 DIAGNOSIS — N83209 Unspecified ovarian cyst, unspecified side: Secondary | ICD-10-CM

## 2010-07-14 DIAGNOSIS — IMO0002 Reserved for concepts with insufficient information to code with codable children: Secondary | ICD-10-CM

## 2010-07-14 NOTE — Telephone Encounter (Signed)
Pls let pt know that her PAP smear came back ABNORMAL.  I need to set her up with gyn for further treatment and this would be a great oppurtunity for them to review her pelvic u/s that was just done.  I will set her up down the hall unless she has anyone in mind that she wants to see.

## 2010-07-14 NOTE — Telephone Encounter (Signed)
Pt advised of results and rec. Pt states she has an appt  7/24 w/gyn. They are to review her pap and U/S.

## 2010-07-14 NOTE — Telephone Encounter (Signed)
Pt advised of all test results and has appt w/GYN.

## 2010-07-26 ENCOUNTER — Encounter: Payer: Self-pay | Admitting: Obstetrics & Gynecology

## 2010-07-26 ENCOUNTER — Ambulatory Visit (INDEPENDENT_AMBULATORY_CARE_PROVIDER_SITE_OTHER): Payer: BC Managed Care – PPO | Admitting: Obstetrics & Gynecology

## 2010-07-26 ENCOUNTER — Other Ambulatory Visit: Payer: Self-pay | Admitting: Obstetrics & Gynecology

## 2010-07-26 ENCOUNTER — Telehealth: Payer: Self-pay | Admitting: Family Medicine

## 2010-07-26 VITALS — BP 112/73 | HR 81 | Resp 16 | Ht 64.0 in | Wt 141.0 lb

## 2010-07-26 DIAGNOSIS — Z1272 Encounter for screening for malignant neoplasm of vagina: Secondary | ICD-10-CM

## 2010-07-26 DIAGNOSIS — Z113 Encounter for screening for infections with a predominantly sexual mode of transmission: Secondary | ICD-10-CM

## 2010-07-26 DIAGNOSIS — N83209 Unspecified ovarian cyst, unspecified side: Secondary | ICD-10-CM

## 2010-07-26 NOTE — Patient Instructions (Signed)
You will be called with your results in one week.  You need a follow up transvaginal US in 10 weeks to assess your ovaries.

## 2010-07-26 NOTE — Telephone Encounter (Signed)
Patient walk-in request to have refill for Trazodone 150mg  sent to [facility]

## 2010-07-27 NOTE — Telephone Encounter (Signed)
Closed

## 2010-07-28 ENCOUNTER — Other Ambulatory Visit: Payer: Self-pay | Admitting: Obstetrics & Gynecology

## 2010-08-03 ENCOUNTER — Encounter: Payer: Self-pay | Admitting: *Deleted

## 2010-08-10 ENCOUNTER — Encounter: Payer: BC Managed Care – PPO | Admitting: Obstetrics & Gynecology

## 2010-08-16 ENCOUNTER — Encounter: Payer: BC Managed Care – PPO | Admitting: Obstetrics & Gynecology

## 2010-08-23 ENCOUNTER — Encounter: Payer: Self-pay | Admitting: Obstetrics & Gynecology

## 2010-08-23 ENCOUNTER — Ambulatory Visit (INDEPENDENT_AMBULATORY_CARE_PROVIDER_SITE_OTHER): Payer: BC Managed Care – PPO | Admitting: Obstetrics & Gynecology

## 2010-08-23 VITALS — BP 116/73 | HR 68 | Temp 96.8°F | Wt 148.0 lb

## 2010-08-23 DIAGNOSIS — R87619 Unspecified abnormal cytological findings in specimens from cervix uteri: Secondary | ICD-10-CM

## 2010-08-23 NOTE — Progress Notes (Signed)
  Subjective:    Patient ID: Shirley Moody, female    DOB: October 15, 1960, 50 y.o.   MRN: [id number]  HPI  LSIL with unsat. colpo  See below      Review of Systems  Feels well today.  UPT negative.     Objective:   Physical Exam Patient identified, informed consent obtained, signed copy in chart, time out performed.  Pap smear and colposcopy reviewed.   Pap LSIL   Colpo Biopsy:  T zone not fully visualized.  No lesion on cervix.  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   Teflon coated speculum with smoke evacuator placed.  Cervix visualized. Paracervical block placed with lidocaine and 1:200,000 epinephrine.  Small Fischer sized LOOP used to remove cone of cervix using blend of cut and cautery on LEEP machine.  Edges/Base cauterized with Ball.  Cervix hemostatic hemostasis.  Patient tolerated procedure well.  Patient given post procedure instructions.  Follow up in 6 months for repeat pap or as needed.        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  ECC with HPV effect.  Pt tolerated procedure well.  Nothing per vagina for 2 weeks.   Routine post LEEP instructions given.  Still needs f/u US in 2 months for prob. Hemorrhagic cyst.

## 2010-08-23 NOTE — Progress Notes (Signed)
Addended by: Granville Lewis on: 08/23/2010 03:07 PM   Modules accepted: Orders

## 2010-08-23 NOTE — Patient Instructions (Signed)
LEEP (Loop Electrosurgical Excision Procedure) LEEP (loop electrosurgical excision procedure) is the removal of a piece of the cervix, which is the neck of the womb (uterus). Electricity is passed through a thin wire loop, which cuts and seals the area from bleeding where tissue is being removed. The procedure is done on women with abnormal changes in their cervical cells discovered during a pap smear. Often these abnormal changes can lead to cancer if left in place and remain untreated. Women with high-grade changes representing very disorganized areas (dysplasia) of tissue in the cervix are often the best candidates for this procedure. LEEP is also done on patients with cervicitis (inflammation of the cervix). This is a condition that causes a large amount of abnormal vaginal discharge but is not precancerous. LEEP is not done on women who have cancer that has already invaded the cervix, on pregnant women or during a menstrual period. BEFORE THE PROCEDURE:  Do not take aspirin or blood thinners for one week before the procedure, or as told by your caregiver.   Eat a light and bland meal before the procedure.   Let your caregiver know if you develop a fever or vaginal infection before the procedure.   Do not smoke for a 2 to 3 days before the procedure.   Arrive to the caregiver's office or clinic at least 30 minutes before the procedure to sign any necessary documents and to get prepared for the procedure.  LET YOUR CAREGIVE KNOW ABOUT:  Allergies to foods or medications.   All the medications you are taking including, over-the-counter, prescription, herbal, eye drops and creams.   Taking illegal drugs.   Consuming excessive alcohol.   Previous problems with anesthetics including novocaine.   Other medical or health problems.  RISKS & COMPLICATIONS  Bleeding and infection. This does not occur very often and can usually easily be treated.   Injury to the vagina, bladder or rectum.    Bleeding after the procedure is normal and varies from light spotting for a week to a few days. Menstrual-like bleeding or bleeding that lasts a couple weeks calls for medical evaluation. You may also experience mild cramping.   Only take over-the-counter or prescription medicines for pain, discomfort or fever as directed by your caregiver. Do not use aspirin as this may increase bleeding.   Premature births from damage and weakening of the cervix.   Severe closing of the cervical opening that causes problems during menstruation (cervical stenosis).  PROCEDURE  LEEP only takes a few minutes. Often, it may be done in the office of your caregiver.   The cervix is injected with a medicine (local anesthetic) that makes the area numb and decreases blood loss. Liquid iodine stain is applied to the cervix (if not allergic to iodine) to find the area of abnormal cells to be removed. The wire loop is then passed across the cervix. It removes the area between the outer and inner portion of the cervix (including the cervical canal), which is the zone most often containing the abnormal cell growth.   The wire loop cuts and seals the cervical tissue from bleeding as it passes through. The size of the piece removed depends on the size of the wire loop and the area of abnormal cells.   A paste is applied to the cut area of the cervix to help prevent bleeding.   The tissue sample is then sent to the lab. It is examined under the microscope by a specialist (pathologist).  AFTER  THE PROCEDURE:  Have someone available to take you home after the procedure.   You may have slight to moderate cramping.   There may be black discharge from the vagina from the paste used on the cervix to prevent bleeding. This is normal.  HOME CARE INSTRUCTIONS  Do not drive for 24 hours.   No tampons, douching or intercourse for two weeks or until your caregiver approves.   Rest at home for 24 hours. You may then resume  normal activities unless advised otherwise by your caregiver.   Take your temperature two times a day, for 4 days. Write the temperatures down on paper.   Take medications your caregiver has ordered and as directed.   Keep all your follow-up appointments and Pap smears as recommended by your caregiver.   Not all results are available during your visit. Do not assume everything is normal if you have not heard from your caregiver or the medical facility. It is important for you to follow up on all of your tests.  SEEK IMMEDIATE MEDICAL CARE IF:  Bleeding is heavier than a normal menstrual cycle or you have blood clots.   An oral temperature above 100.4 develops.   You have increasing cramps or pain not relieved with medication, or you develop belly (abdominal) pain which does not seem to be related to the same area of earlier cramping and pain.   You are light headed, unusually weak or have fainting episodes.   You develop painful or bloody urination.   You develop a bad smelling vaginal discharge.  Document Released: 03/25/2002 Document Re-Released: 03/31/2008 [facility] Patient Information 2011 [place], [place].

## 2010-09-07 ENCOUNTER — Ambulatory Visit (INDEPENDENT_AMBULATORY_CARE_PROVIDER_SITE_OTHER): Payer: BC Managed Care – PPO | Admitting: Obstetrics & Gynecology

## 2010-09-07 ENCOUNTER — Encounter: Payer: Self-pay | Admitting: Obstetrics & Gynecology

## 2010-09-07 VITALS — BP 106/69 | HR 94 | Resp 16 | Ht 64.0 in | Wt 143.0 lb

## 2010-09-07 DIAGNOSIS — N83209 Unspecified ovarian cyst, unspecified side: Secondary | ICD-10-CM

## 2010-09-07 DIAGNOSIS — N879 Dysplasia of cervix uteri, unspecified: Secondary | ICD-10-CM

## 2010-09-07 MED ORDER — DOXYCYCLINE HYCLATE 100 MG PO TABS: 100.0000 mg | ORAL_TABLET | Freq: Two times a day (BID) | ORAL | Status: DC

## 2010-09-07 NOTE — Progress Notes (Signed)
  Subjective:    Patient ID: Shirley Moody, female    DOB: 09/15/1960, 50 y.o.   MRN: [id number]  HPI  Pt having 10 days of bleeding starting 5 days after LEEP.  Doesn't know if it is menses or from procedure.  Pt perimenopausal--last menses nml was March 2012.  Pt denies sex or anything per vagina  Review of Systems  Constitutional: Negative.   Respiratory: Negative.   Cardiovascular: Negative.   Genitourinary: Positive for vaginal bleeding.       Objective:   Physical Exam  Constitutional: She appears well-developed and well-nourished. No distress.  HENT:  Head: Normocephalic and atraumatic.  Eyes: Conjunctivae are normal.  Abdominal: Soft.  Genitourinary:       Area from LEEP not well healed.  ?mild infection.          Assessment & Plan:  51 yo female s/p LEEP for LSIL and and unsatisfactory colpo  1.  Doxy 100 mg bid for 10 days for mild cervical infection 2.  Path shows koliocytic atypia.  Margins negative. 3.  RTC 2 weeks.  Otherwise pap in 6 months.

## 2010-09-07 NOTE — Patient Instructions (Signed)
Nothing per vagina for 2 more weeks.  Take anitbiotic as directed.   Have a great day with the kids!!

## 2010-09-08 ENCOUNTER — Ambulatory Visit (INDEPENDENT_AMBULATORY_CARE_PROVIDER_SITE_OTHER): Payer: BC Managed Care – PPO | Admitting: Family Medicine

## 2010-09-08 ENCOUNTER — Encounter: Payer: Self-pay | Admitting: Family Medicine

## 2010-09-08 DIAGNOSIS — Z789 Other specified health status: Secondary | ICD-10-CM

## 2010-09-08 DIAGNOSIS — F3289 Other specified depressive episodes: Secondary | ICD-10-CM

## 2010-09-08 DIAGNOSIS — R41840 Attention and concentration deficit: Secondary | ICD-10-CM

## 2010-09-08 DIAGNOSIS — Z79890 Hormone replacement therapy (postmenopausal): Secondary | ICD-10-CM

## 2010-09-08 DIAGNOSIS — F329 Major depressive disorder, single episode, unspecified: Secondary | ICD-10-CM

## 2010-09-08 MED ORDER — FLUOXETINE HCL (PMDD) 20 MG PO TABS: 1.0000 | ORAL_TABLET | Freq: Every day | ORAL | Status: DC

## 2010-09-08 NOTE — Progress Notes (Signed)
  Subjective:    Patient ID: Shirley Moody, female    DOB: [birth year], 50 y.o.   MRN: [id number]  HPI Would like to be tx for depression. Was on welbutrin and lexapro in the past.  Was on antidpressants for 15 yrs.  Had c diff last winter and told to stop her meds. .  Off meds since Jan. She is a Engineer, site. This summer has felt apathetic, doesn't want to get out. Now that back in school she is doing better.  Has been on zoloft and celexa. Felt too numb on the celexa and lexapro. Does see a counselor as well.   Thinks she could be ADHD.  Feels like she can't focus.  She would also like to start hormone replacement therapy. She started to the saliva testing and got the results back in July she just wasn't motivated to actually start the medications. She would like this to go ahead and [facility] [place].   Review of Systems     BP 125/77  Pulse 73  Wt 146 lb (66.225 kg)    No Known Allergies  Past Medical History  Diagnosis Date  . Depression   . Allergy   . Diverticulitis     Past Surgical History  Procedure Date  . Neuroma surgery     Right foot  . Dilation and curettage of uterus     History   Social History  . Marital Status: Single    Spouse Name: N/A    Number of Children: N/A  . Years of Education: N/A   Occupational History  . Not on file.   Social History Main Topics  . Smoking status: Former Games developer  . Smokeless tobacco: Not on file  . Alcohol Use: Yes     1 drink/month  . Drug Use: No  . Sexually Active:    Other Topics Concern  . Not on file   Social History Narrative  . No narrative on file    Family History  Problem Relation Age of Onset  . Alcohol abuse Mother   . Hypertension Father   . Other Father 66    AMI/ CVA at 58    Ms. Shirley Moody does not currently have medications on file.  Objective:   Physical Exam  Constitutional: She is oriented to person, place, and time. She appears well-developed and  well-nourished.  Cardiovascular: Normal rate, regular rhythm and normal heart sounds.   Pulmonary/Chest: Effort normal and breath sounds normal.  Neurological: She is alert and oriented to person, place, and time.  Skin: Skin is warm and dry.  Psychiatric: She has a normal mood and affect. Her behavior is normal.          Assessment & Plan:  Lack of focus-I discussed with her that we recommend formal testing to determine if she has ADD or ADHD before prescribing medications which are scheduled drugs. At this point in time I haven't recommended we work on her depression first and see if some of her inattentiveness and lack of ability to focus improves as this can often times be a sign of depression. If we get her therapeutic with her depression and she is still struggling with lack of focus and we can consider further evaluation for ADD.

## 2010-09-08 NOTE — Patient Instructions (Signed)
Take 1/2 tab daily for one week, then increase to whole tab daily.

## 2010-09-08 NOTE — Assessment & Plan Note (Signed)
Her PHQ 9 score is 22 today. This is indicative of severe disease. She does occasionally have bouts of being better off dead but denies being suicidal. She is definitely withdrawn socially but hasn't actually gotten better since she started back to school. She would like to try something different than what she has tried in the past. She got her affect was too flat on Lexapro and citalopram. She did not like Zoloft either. I recommends trying fluoxetine. We discussed potential side effects of the medication including increased risk of suicide. She is to followup in 3-4 weeks to make sure she's doing well and adjust her dose.

## 2010-10-05 ENCOUNTER — Other Ambulatory Visit: Payer: Self-pay | Admitting: Family Medicine

## 2010-10-06 ENCOUNTER — Ambulatory Visit (INDEPENDENT_AMBULATORY_CARE_PROVIDER_SITE_OTHER): Payer: BC Managed Care – PPO | Admitting: Family Medicine

## 2010-10-06 ENCOUNTER — Encounter: Payer: Self-pay | Admitting: Family Medicine

## 2010-10-06 VITALS — BP 113/76 | HR 60 | Wt 138.0 lb

## 2010-10-06 DIAGNOSIS — F32A Depression, unspecified: Secondary | ICD-10-CM

## 2010-10-06 DIAGNOSIS — F3289 Other specified depressive episodes: Secondary | ICD-10-CM

## 2010-10-06 DIAGNOSIS — F329 Major depressive disorder, single episode, unspecified: Secondary | ICD-10-CM

## 2010-10-06 DIAGNOSIS — F909 Attention-deficit hyperactivity disorder, unspecified type: Secondary | ICD-10-CM

## 2010-10-06 MED ORDER — METHYLPHENIDATE HCL ER (OSM) 27 MG PO TBCR: 27.0000 mg | EXTENDED_RELEASE_TABLET | ORAL | Status: DC

## 2010-10-06 NOTE — Progress Notes (Signed)
  Subjective:    Patient ID: Shirley Moody, female    DOB: [birth year], 50 y.o.   MRN: [id number]  HPI F/U mood - Says doing well on the prozac. tolerating well. Doesn't feel too sedated.  She wants to stay at her current dose.  Says she doesn't feel nearly as down as before. Sleep is fari. Still feels unorganized and overwhelmed esp at work.  Still having difficulty completing tasks. Says remembers feeling this way since she was a teenager. Has really been struggling this year.  Still thinks she has ADD. She took a friends concerta nd says really noticed a dramatic difference in her ability to concentrate. No prior hert problems.      Review of Systems     Objective:   Physical Exam  Constitutional: She is oriented to person, place, and time. She appears well-developed and well-nourished.  HENT:  Head: Normocephalic and atraumatic.  Cardiovascular: Normal rate, regular rhythm and normal heart sounds.   Pulmonary/Chest: Effort normal and breath sounds normal.  Neurological: She is alert and oriented to person, place, and time.  Skin: Skin is warm and dry.  Psychiatric: She has a normal mood and affect. Her behavior is normal.          Assessment & Plan:  ADD- I discussed that she needs formal evaluation. [facility]. In the meantime will treat with concerta. If she doesn't go for eval then will discontinue ADD med. F/U in 1 mo for BP and weight check.   Depression- IMproved.  Pt didn't want to do PHQ-9 today. She wants to stay at current dose. F/U in 1 months.

## 2010-10-12 ENCOUNTER — Telehealth: Payer: Self-pay | Admitting: Family Medicine

## 2010-10-12 NOTE — Telephone Encounter (Signed)
Updated medslist ?

## 2010-10-25 ENCOUNTER — Telehealth: Payer: Self-pay | Admitting: Family Medicine

## 2010-10-25 ENCOUNTER — Other Ambulatory Visit: Payer: Self-pay | Admitting: Family Medicine

## 2010-10-25 NOTE — Telephone Encounter (Signed)
Received request for generic concerta 27 mg from the pharm. Plan:  Last refill given on 10-06-10.  Too early for refill.  Not due til 11-04-10. Notified the pharmacy. Jarvis Newcomer, LPN [facility]

## 2010-10-25 NOTE — Telephone Encounter (Signed)
Prior auth requested from [employer] for concerta 27mg  ER tabs. Plan:  Authorized from 10-04-10-----10-25-2011.  A letter will be sent stating the authorization to our office as well as the pt.  Will notify the pharmacy. Jarvis Newcomer, LPN [facility]

## 2010-10-28 ENCOUNTER — Ambulatory Visit
Admission: RE | Admit: 2010-10-28 | Discharge: 2010-10-28 | Disposition: A | Discharge status: Home / Self Care | Payer: BC Managed Care – PPO | Source: Ambulatory Visit | Attending: Obstetrics & Gynecology | Admitting: Obstetrics & Gynecology

## 2010-10-28 DIAGNOSIS — N83209 Unspecified ovarian cyst, unspecified side: Secondary | ICD-10-CM

## 2010-11-03 ENCOUNTER — Other Ambulatory Visit: Payer: Self-pay | Admitting: Family Medicine

## 2010-11-09 ENCOUNTER — Telehealth: Payer: Self-pay | Admitting: Family Medicine

## 2010-11-09 NOTE — Telephone Encounter (Signed)
Pt calling regarding the referral appt for ADD testing. Plan:  Information given to the pt. Jarvis Newcomer, LPN [facility]

## 2010-11-29 ENCOUNTER — Other Ambulatory Visit: Payer: Self-pay | Admitting: *Deleted

## 2010-11-29 MED ORDER — TRAZODONE HCL 150 MG PO TABS: 150.0000 mg | ORAL_TABLET | Freq: Every day | ORAL | Status: DC

## 2010-12-01 ENCOUNTER — Other Ambulatory Visit: Payer: Self-pay | Admitting: *Deleted

## 2010-12-01 MED ORDER — METHYLPHENIDATE HCL ER (OSM) 27 MG PO TBCR: 27.0000 mg | EXTENDED_RELEASE_TABLET | Freq: Every day | ORAL | Status: DC

## 2010-12-05 ENCOUNTER — Telehealth: Payer: Self-pay | Admitting: Family Medicine

## 2010-12-05 DIAGNOSIS — F909 Attention-deficit hyperactivity disorder, unspecified type: Secondary | ICD-10-CM | POA: Insufficient documentation

## 2010-12-05 NOTE — Telephone Encounter (Signed)
I received your results [facility] and they confirmed a diagnosis of ADHD. I would be happy to follow her at any time so that we can see if she felt like the Concerta was helpful or not and so we can adjust her medication if needed.

## 2010-12-14 ENCOUNTER — Other Ambulatory Visit: Payer: Self-pay | Admitting: Family Medicine

## 2010-12-14 NOTE — Telephone Encounter (Signed)
Left message on vm

## 2011-01-23 ENCOUNTER — Encounter: Payer: Self-pay | Admitting: Family Medicine

## 2011-01-23 ENCOUNTER — Ambulatory Visit (INDEPENDENT_AMBULATORY_CARE_PROVIDER_SITE_OTHER): Payer: BC Managed Care – PPO | Admitting: Family Medicine

## 2011-01-23 DIAGNOSIS — F418 Other specified anxiety disorders: Secondary | ICD-10-CM

## 2011-01-23 DIAGNOSIS — F909 Attention-deficit hyperactivity disorder, unspecified type: Secondary | ICD-10-CM

## 2011-01-23 DIAGNOSIS — F341 Dysthymic disorder: Secondary | ICD-10-CM

## 2011-01-23 DIAGNOSIS — N951 Menopausal and female climacteric states: Secondary | ICD-10-CM

## 2011-01-23 MED ORDER — FLUOXETINE HCL 40 MG PO CAPS: 40.0000 mg | ORAL_CAPSULE | Freq: Every day | ORAL | Status: DC

## 2011-01-23 MED ORDER — METHYLPHENIDATE HCL ER (OSM) 36 MG PO TBCR: 36.0000 mg | EXTENDED_RELEASE_TABLET | Freq: Every day | ORAL | Status: DC

## 2011-01-23 NOTE — Progress Notes (Signed)
  Subjective:    Patient ID: Shirley Moody, female    DOB: Aug 25, 1960, 51 y.o.   MRN: [id number]  HPI Depression/Anxiety - Feels like her head feels fuzzy. Feels like has more bad days than good days.  Says having bad thoughts. Not tearful. Low energy. Dec sex drive.  Feels flat.    ADHD- Says the concerta worked initially but says feels like no longer working.  Says feels like not taking anytthing. Still having difficulty focusing  HRT - see below. In particular she complains of night sweats and chills. She said she will literally wake up with her feet sweating. She has been more irritable. She's been without her HRT for him this year. It is getting to the point where her sleep quality is so poor at night it is affecting her during the day.   Review of Systems     Objective:   Physical Exam  Constitutional: She is oriented to person, place, and time. She appears well-developed and well-nourished.  HENT:  Head: Normocephalic and atraumatic.  Cardiovascular: Normal rate, regular rhythm and normal heart sounds.   Pulmonary/Chest: Effort normal and breath sounds normal.  Neurological: She is alert and oriented to person, place, and time.  Skin: Skin is warm and dry.  Psychiatric: She has a normal mood and affect. Her behavior is normal.          Assessment & Plan:  Depression/anxiety - Tried citalopram, Lexapro, fluoxetine, sertraline, effexor (prisiq), Welbutrin. Never tried paxil. Will inc fluoxetine to 40mg  first and try for one month. If still not improving her mood then will change to Viibryd or Cymbalta. Patient agrees to care plan. Call if any concerns or side effects on the increased dose. Call if have any suicidal thoughts.  HRT- She would like to restart her hormones. She has been off of them for over a year. She has tried to call the pharmacy 3 times and says still hasn't received a medication.  We called the pharmacy while she was here. They called [place] back later and we  verbally fill the medications. She can pick his upper extremities in the next week or 2. We did give her a return phone call so she was aware that they are filling the medication in the next 24 hours.  ADHD- Increase concerta to 36 mg. F.U in 8 weeks. Call for refill if working well. If not then let us know. It is not working well this dose we can adjust or consider changing medications. Only the Concerta is the only medication she's ever tried. Also consider that this is not working as well because her depression is well controlled right now.

## 2011-02-10 ENCOUNTER — Other Ambulatory Visit: Payer: Self-pay | Admitting: Physician Assistant

## 2011-02-10 ENCOUNTER — Ambulatory Visit (INDEPENDENT_AMBULATORY_CARE_PROVIDER_SITE_OTHER): Payer: BC Managed Care – PPO | Admitting: Physician Assistant

## 2011-02-10 ENCOUNTER — Encounter: Payer: Self-pay | Admitting: Physician Assistant

## 2011-02-10 VITALS — BP 114/71 | HR 69 | Temp 98.3°F | Ht 65.0 in | Wt 135.0 lb

## 2011-02-10 DIAGNOSIS — R300 Dysuria: Secondary | ICD-10-CM

## 2011-02-10 DIAGNOSIS — J069 Acute upper respiratory infection, unspecified: Secondary | ICD-10-CM

## 2011-02-10 DIAGNOSIS — N390 Urinary tract infection, site not specified: Secondary | ICD-10-CM

## 2011-02-10 LAB — POCT URINALYSIS DIPSTICK
Ketones, UA: NEGATIVE
Protein, UA: 100
Spec Grav, UA: 1.025
Urobilinogen, UA: 0.2
pH, UA: 6.5

## 2011-02-10 MED ORDER — CIPROFLOXACIN HCL 250 MG PO TABS: 250.0000 mg | ORAL_TABLET | Freq: Two times a day (BID) | ORAL | Status: AC

## 2011-02-10 NOTE — Patient Instructions (Addendum)
Consider Mucinex OTC for head cold. Cirpo for 3 days. Follow up if symptoms not improving in 3 days.

## 2011-02-10 NOTE — Progress Notes (Signed)
  Subjective:    Patient ID: Shirley Moody, female    DOB: [birth year], 50 y.o.   MRN: [id number]  HPI Patient presents with dysuria for 2 days. She denies a history of UTI. She first noticed blood in her urine 2 days ago. She has not had her menstrual cycle in 1 year. She denies back pain or vaginal discharge. She has not ran a fever and no chills. She has had some nausea but no vomiting.   She also complains of head congestion for 3 days. She's had some sinus pressure and headaches. She has a cough that is productive but does not keep her up at night. She has not tried anything to make better. She denies ear pain, sore throat, shortness of breath, or wheezing.   Review of Systems     Objective   Physical Exam  Constitutional: She is oriented to person, place, and time. She appears well-developed and well-nourished.  HENT:  Head: Normocephalic and atraumatic.  Right Ear: External ear normal.  Left Ear: External ear normal.  Nose: Nose normal.  Mouth/Throat: Oropharynx is clear and moist. No oropharyngeal exudate.       Bilateral nares edematous with erythema.  Eyes: Right eye exhibits no discharge. Left eye exhibits no discharge.  Neck: Normal range of motion. Neck supple.  Cardiovascular: Normal rate, regular rhythm and normal heart sounds.   Pulmonary/Chest: Effort normal and breath sounds normal.  Abdominal: Soft. Bowel sounds are normal. She exhibits no distension.       Tenderness in lower left quadrant. (patient reports she has always had tenderness here since diagnosis of IBS)  Neurological: She is alert and oriented to person, place, and time.  Skin: Skin is warm and dry.  Psychiatric: She has a normal mood and affect. Her behavior is normal.          Assessment & Plan:  Upper respiratory infection-continue with symptomatic treatment. Recommend Mucinex twice a day along with the sinus rinse and Tylenol or Motrin for headache.  UTI-treated with Cipro for 3 days.  Followup with office if not feeling better in 3 days.

## 2011-02-15 LAB — URINE CULTURE

## 2011-03-07 ENCOUNTER — Other Ambulatory Visit: Payer: Self-pay | Admitting: Family Medicine

## 2011-03-08 ENCOUNTER — Other Ambulatory Visit: Payer: Self-pay | Admitting: *Deleted

## 2011-03-08 MED ORDER — MOMETASONE FUROATE 50 MCG/ACT NA SUSP: 2.0000 | Freq: Every day | NASAL | Status: DC

## 2011-04-20 ENCOUNTER — Other Ambulatory Visit: Payer: Self-pay | Admitting: Family Medicine

## 2011-05-21 ENCOUNTER — Other Ambulatory Visit: Payer: Self-pay | Admitting: Family Medicine

## 2011-06-09 ENCOUNTER — Encounter: Payer: Self-pay | Admitting: *Deleted

## 2011-06-09 ENCOUNTER — Emergency Department
Admission: EM | Admit: 2011-06-09 | Discharge: 2011-06-09 | Disposition: A | Discharge status: Home / Self Care | Payer: BC Managed Care – PPO | Source: Home / Self Care | Attending: Emergency Medicine | Admitting: Emergency Medicine

## 2011-06-09 DIAGNOSIS — J069 Acute upper respiratory infection, unspecified: Secondary | ICD-10-CM

## 2011-06-09 DIAGNOSIS — J329 Chronic sinusitis, unspecified: Secondary | ICD-10-CM

## 2011-06-09 HISTORY — DX: Irritable bowel syndrome, unspecified: K58.9

## 2011-06-09 LAB — POCT RAPID STREP A (OFFICE): Rapid Strep A Screen: NEGATIVE

## 2011-06-09 MED ORDER — AMOXICILLIN-POT CLAVULANATE 875-125 MG PO TABS: 1.0000 | ORAL_TABLET | Freq: Two times a day (BID) | ORAL | Status: AC

## 2011-06-09 NOTE — ED Provider Notes (Signed)
History     CSN: [id number]  Arrival date & time 06/09/11  1636   First MD Initiated Contact with Patient 06/09/11 1703      Chief Complaint  Patient presents with  . Sore Throat  . Nasal Congestion    (Consider location/radiation/quality/duration/timing/severity/associated sxs/prior treatment) HPI Shirley Moody is a 51 y.o. female who complains of onset of cold symptoms for 4 days.  The symptoms are constant and mild-moderate in severity.  She has been using ibuprofen which is helping a little bit.  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  She has allergies normally and gets shots, but this is worse. + sore throat + hoarseness No cough No pleuritic pain No wheezing + nasal congestion + post-nasal drainage + sinus pain/pressure No chest congestion No itchy/red eyes No earache No hemoptysis No SOB No chills/sweats No fever No nausea No vomiting No abdominal pain No diarrhea No skin rashes No fatigue No myalgias No headache    Past Medical History  Diagnosis Date  . Depression   . Allergy   . Diverticulitis     Past Surgical History  Procedure Date  . Neuroma surgery     Right foot  . Dilation and curettage of uterus     Family History  Problem Relation Age of Onset  . Alcohol abuse Mother   . Hypertension Father   . Other Father 44    AMI/ CVA at 36    History  Substance Use Topics  . Smoking status: Former Games developer  . Smokeless tobacco: Not on file  . Alcohol Use: Yes     1 drink/month    OB History    Grav Para Term Preterm Abortions TAB SAB Ect Mult Living   1 0   1           Review of Systems  All other systems reviewed and are negative.    Allergies  Review of patient's allergies indicates no known allergies.  Home Medications   Current Outpatient Rx  Name Route Sig Dispense Refill  . CETIRIZINE HCL 10 MG PO TABS Oral Take 10 mg by mouth daily.      Marland Kitchen  FLUOXETINE HCL 20 MG PO TABS  TAKE ONE TABLET BY MOUTH DAILY 30 tablet 3  . FLUOXETINE HCL 40 MG PO CAPS Oral Take 1 capsule (40 mg total) by mouth daily. 30 capsule 1  . METHYLPHENIDATE HCL ER 27 MG PO TBCR Oral Take 1 tablet (27 mg total) by mouth every morning. 30 tablet 0  . METHYLPHENIDATE HCL ER 36 MG PO TBCR Oral Take 1 tablet (36 mg total) by mouth daily. 30 tablet 0  . PROGESTERONE EX Apply externally Apply topically daily. 6% cream. Apply half an mL to inner arm or upper thigh at bedtime.     . MOMETASONE FUROATE 50 MCG/ACT NA SUSP Nasal Place 2 sprays into the nose daily. 17 g 2  . NONFORMULARY OR COMPOUNDED ITEM Topical Apply 0.5 each topically daily. Biest (80:20) 5mg /ml cream. Apply half an mL to her inner arm or upper thigh once daily.    Marland Kitchen PROGESTERONE MICRONIZED 100 MG PO CAPS Oral Take 100 mg by mouth at bedtime.      . TRAZODONE HCL 150 MG PO TABS Oral Take 1 tablet (150 mg total) by mouth at bedtime. 90 tablet 1    There were no vitals taken for this visit.  Physical Exam  Nursing note and vitals reviewed. Constitutional: She is oriented to person, place, and time. She appears well-developed and well-nourished.  HENT:  Head: Normocephalic and atraumatic.  Right Ear: Tympanic membrane, external ear and ear canal normal.  Left Ear: Tympanic membrane, external ear and ear canal normal.  Nose: Mucosal edema and rhinorrhea present.  Mouth/Throat: Posterior oropharyngeal erythema present. No oropharyngeal exudate or posterior oropharyngeal edema.  Eyes: No scleral icterus.  Neck: Neck supple.  Cardiovascular: Regular rhythm and normal heart sounds.   Pulmonary/Chest: Effort normal and breath sounds normal. No respiratory distress.  Neurological: She is alert and oriented to person, place, and time.  Skin: Skin is warm and dry.  Psychiatric: She has a normal mood and affect. Her speech is normal.    ED Course  Procedures (including critical care time)  Labs Reviewed -  No data to display No results found.   1. Acute upper respiratory infections of unspecified site       MDM  1)  Take the prescribed antibiotic as instructed.  Continue with your normal allergy regimen. 2)  Use nasal saline solution (over the counter) at least 3 times a day. 3)  Can take tylenol every 6 hours or motrin every 8 hours for pain or fever. 4)  Follow up with your primary doctor if no improvement in 5-7 days, sooner if increasing pain, fever, or new symptoms.       Marlaine Hind, MD 06/09/11 [phone]

## 2011-06-09 NOTE — ED Notes (Signed)
Pt c/o sore throat, nasal congestion, and hoarseness x 4 days. She has taken IBF.

## 2011-06-15 ENCOUNTER — Other Ambulatory Visit: Payer: Self-pay | Admitting: *Deleted

## 2011-06-15 MED ORDER — FLUOXETINE HCL 40 MG PO CAPS: 40.0000 mg | ORAL_CAPSULE | Freq: Every day | ORAL | Status: DC

## 2011-06-27 ENCOUNTER — Encounter: Payer: Self-pay | Admitting: *Deleted

## 2011-07-06 ENCOUNTER — Telehealth: Payer: Self-pay | Admitting: *Deleted

## 2011-07-06 NOTE — Telephone Encounter (Signed)
Pt states where she gets the HRT from the [facility] they are telling her she needs to have lab work done again so they can adjust the med. Pt forgot to take list of labs requested and will call back with those labs. Is it ok to order those labs?

## 2011-07-07 NOTE — Telephone Encounter (Signed)
Yes, ok to order those.

## 2011-07-07 NOTE — Telephone Encounter (Signed)
Waiting on pt to call back with labs that need to be ordered.

## 2011-07-11 ENCOUNTER — Telehealth: Payer: Self-pay | Admitting: *Deleted

## 2011-07-11 DIAGNOSIS — Z79890 Hormone replacement therapy (postmenopausal): Secondary | ICD-10-CM

## 2011-07-12 LAB — T3, FREE: T3, Free: 2.4 pg/mL (ref 2.3–4.2)

## 2011-07-12 LAB — PROGESTERONE: Progesterone: 3.1 ng/mL

## 2011-07-12 LAB — T4, FREE: Free T4: 0.93 ng/dL (ref 0.80–1.80)

## 2011-07-13 LAB — ESTROGENS, TOTAL: Estrogen: 81.6 pg/mL

## 2011-07-31 ENCOUNTER — Other Ambulatory Visit: Payer: Self-pay | Admitting: Family Medicine

## 2011-08-09 ENCOUNTER — Other Ambulatory Visit: Payer: Self-pay | Admitting: *Deleted

## 2011-08-09 MED ORDER — FLUOXETINE HCL 40 MG PO CAPS: 40.0000 mg | ORAL_CAPSULE | Freq: Every day | ORAL | Status: DC

## 2011-11-01 ENCOUNTER — Other Ambulatory Visit: Payer: Self-pay | Admitting: Family Medicine

## 2011-11-02 NOTE — Telephone Encounter (Signed)
Needs appointment

## 2011-11-15 ENCOUNTER — Other Ambulatory Visit: Payer: Self-pay | Admitting: Sports Medicine

## 2011-11-15 ENCOUNTER — Ambulatory Visit (INDEPENDENT_AMBULATORY_CARE_PROVIDER_SITE_OTHER): Payer: BC Managed Care – PPO

## 2011-11-15 ENCOUNTER — Ambulatory Visit (INDEPENDENT_AMBULATORY_CARE_PROVIDER_SITE_OTHER): Payer: BC Managed Care – PPO | Admitting: Sports Medicine

## 2011-11-15 ENCOUNTER — Encounter: Payer: Self-pay | Admitting: Sports Medicine

## 2011-11-15 VITALS — BP 125/69 | HR 60 | Ht 64.0 in | Wt 132.0 lb

## 2011-11-15 DIAGNOSIS — R0781 Pleurodynia: Secondary | ICD-10-CM

## 2011-11-15 DIAGNOSIS — R079 Chest pain, unspecified: Secondary | ICD-10-CM

## 2011-11-15 DIAGNOSIS — S20219A Contusion of unspecified front wall of thorax, initial encounter: Secondary | ICD-10-CM

## 2011-11-15 DIAGNOSIS — S298XXA Other specified injuries of thorax, initial encounter: Secondary | ICD-10-CM | POA: Insufficient documentation

## 2011-11-15 DIAGNOSIS — X58XXXA Exposure to other specified factors, initial encounter: Secondary | ICD-10-CM

## 2011-11-15 MED ORDER — KETOROLAC TROMETHAMINE 30 MG/ML IJ SOLN
30.0000 mg | Freq: Once | INTRAMUSCULAR | Status: AC
  Administered 2011-11-15: 30 mg via INTRAMUSCULAR

## 2011-11-15 MED ORDER — HYDROCODONE-ACETAMINOPHEN 5-325 MG PO TABS: 1.0000 | ORAL_TABLET | Freq: Four times a day (QID) | ORAL | Status: DC | PRN
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
## 2011-11-15 NOTE — Patient Instructions (Signed)
Rib Contusion  A rib contusion (bruise) can occur by a blow to the chest or by a fall against a hard object. Usually these will be much better in a couple weeks. If X-rays were taken today and there are no broken bones (fractures), the diagnosis of bruising is made. However, broken ribs may not show up for several days, or may be discovered later on a routine X-ray when signs of healing show up. If this happens to you, it does not mean that something was missed on the X-ray, but simply that it did not show up on the first X-rays. Earlier diagnosis will not usually change the treatment.  HOME CARE INSTRUCTIONS   · Avoid strenuous activity. Be careful during activities and avoid bumping the injured ribs. Activities that pull on the injured ribs and cause pain should be avoided, if possible.  · For the first day or two, an ice pack used every 20 minutes while awake may be helpful. Put ice in a plastic bag and put a towel between the bag and the skin.  · Eat a normal, well-balanced diet. Drink plenty of fluids to avoid constipation.  · Take deep breaths several times a day to keep lungs free of infection. Try to cough several times a day. Splint the injured area with a pillow while coughing to ease pain. Coughing can help prevent pneumonia.  · Wear a rib belt or binder only if told to do so by your caregiver. If you are wearing a rib belt or binder, you must do the breathing exercises as directed by your caregiver. If not used properly, rib belts or binders restrict breathing which can lead to pneumonia.  · Only take over-the-counter or prescription medicines for pain, discomfort, or fever as directed by your caregiver.  SEEK MEDICAL CARE IF:   · You or your child has an oral temperature above 102° F (38.9° C).  · Your baby is older than 3 months with a rectal temperature of 100.5° F (38.1° C) or higher for more than 1 day.  · You develop a cough, with thick or bloody sputum.  SEEK IMMEDIATE MEDICAL CARE IF:   · You  have difficulty breathing.  · You feel sick to your stomach (nausea), have vomiting or belly (abdominal) pain.  · You have worsening pain, not controlled with medications, or there is a change in the location of the pain.  · You develop sweating or radiation of the pain into the arms, jaw or shoulders, or become light headed or faint.  · You or your child has an oral temperature above 102° F (38.9° C), not controlled by medicine.  · Your or your baby is older than 3 months with a rectal temperature of 102° F (38.9° C) or higher.  · Your baby is 3 months old or younger with a rectal temperature of 100.4° F (38° C) or higher.  MAKE SURE YOU:   · Understand these instructions.  · Will watch your condition.  · Will get help right away if you are not doing well or get worse.  Document Released: 09/27/2000 Document Revised: 03/27/2011 Document Reviewed: 08/21/2007  [employer]® Patient Information ©2013 [employer].

## 2011-11-15 NOTE — Assessment & Plan Note (Signed)
Ace wrapped for comfort, patient understands she needs to work on incentive spirometry multiple times per day to prevent atelectasis. Toradol 30 mg intramuscular in the office. Hydrocodone for use up to 6 hours for pain.

## 2011-11-15 NOTE — Addendum Note (Signed)
Addended by: Judie Petit A on: 11/15/2011 04:14 PM   Modules accepted: Orders

## 2011-11-18 ENCOUNTER — Other Ambulatory Visit: Payer: Self-pay | Admitting: Family Medicine

## 2011-11-23 ENCOUNTER — Encounter: Payer: Self-pay | Admitting: Sports Medicine

## 2011-11-23 ENCOUNTER — Ambulatory Visit (INDEPENDENT_AMBULATORY_CARE_PROVIDER_SITE_OTHER): Payer: BC Managed Care – PPO | Admitting: Sports Medicine

## 2011-11-23 VITALS — BP 115/75 | HR 59 | Wt 133.0 lb

## 2011-11-23 DIAGNOSIS — S20219A Contusion of unspecified front wall of thorax, initial encounter: Secondary | ICD-10-CM

## 2011-11-23 DIAGNOSIS — S298XXA Other specified injuries of thorax, initial encounter: Secondary | ICD-10-CM
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
## 2011-11-23 NOTE — Assessment & Plan Note (Signed)
Pain improving significantly. Continue oral analgesics, discontinue chest wrap. Return to clinic in 2 weeks on an as-needed basis.

## 2011-11-28 ENCOUNTER — Ambulatory Visit: Payer: BC Managed Care – PPO | Admitting: Family Medicine

## 2012-01-24 ENCOUNTER — Other Ambulatory Visit: Payer: Self-pay

## 2012-02-06 ENCOUNTER — Other Ambulatory Visit: Payer: Self-pay | Admitting: Family Medicine

## 2012-02-17 ENCOUNTER — Other Ambulatory Visit: Payer: Self-pay | Admitting: Family Medicine

## 2012-02-24 ENCOUNTER — Emergency Department
Admission: EM | Admit: 2012-02-24 | Discharge: 2012-02-24 | Disposition: A | Discharge status: Home / Self Care | Payer: BC Managed Care – PPO | Source: Home / Self Care | Attending: Family Medicine | Admitting: Family Medicine

## 2012-02-24 ENCOUNTER — Encounter: Payer: Self-pay | Admitting: *Deleted

## 2012-02-24 DIAGNOSIS — J069 Acute upper respiratory infection, unspecified: Secondary | ICD-10-CM

## 2012-02-24 MED ORDER — PREDNISONE 20 MG PO TABS: 20.0000 mg | ORAL_TABLET | Freq: Two times a day (BID) | ORAL | Status: DC

## 2012-02-24 MED ORDER — BENZONATATE 200 MG PO CAPS: 200.0000 mg | ORAL_CAPSULE | Freq: Every day | ORAL | Status: DC

## 2012-02-24 MED ORDER — AMOXICILLIN 875 MG PO TABS: 875.0000 mg | ORAL_TABLET | Freq: Two times a day (BID) | ORAL | Status: DC

## 2012-02-24 NOTE — ED Provider Notes (Signed)
History     CSN: [id number]  Arrival date & time 02/24/12  0903   First MD Initiated Contact with Patient 02/24/12 [phone]      Chief Complaint  Patient presents with  . Cough  . Nasal Congestion  . Dizziness       HPI Comments: Patient complains of approximately 10 day history of gradually progressive URI symptoms beginning with a mild sore throat (now improved), followed by progressive nasal congestion.  A cough started about 4 days ago.  Complains of fatigue but no myalgias.  Cough is now worse at night and generally non-productive during the day.  There has been no pleuritic pain, shortness of breath, or wheezes.   The history is provided by the patient.    Past Medical History  Diagnosis Date  . Depression   . Allergy   . Diverticulitis   . IBS (irritable bowel syndrome)     Past Surgical History  Procedure Laterality Date  . Neuroma surgery      Right foot  . Dilation and curettage of uterus      Family History  Problem Relation Age of Onset  . Alcohol abuse Mother   . COPD Mother   . Hypertension Father   . Other Father 47    AMI/ CVA at 46  . Stroke Father     History  Substance Use Topics  . Smoking status: Former Games developer  . Smokeless tobacco: Not on file  . Alcohol Use: Yes     Comment: 1 drink/month    OB History   Grav Para Term Preterm Abortions TAB SAB Ect Mult Living   1 0   1           Review of Systems + sore throat + cough No pleuritic pain No wheezing + nasal congestion + post-nasal drainage + sinus pain/pressure No itchy/red eyes No earache No hemoptysis No SOB No fever, + chills No nausea No vomiting No abdominal pain No diarrhea No urinary symptoms No skin rashes + fatigue No myalgias No headache Used OTC meds without relief  Allergies  Biaxin  Home Medications   Current Outpatient Rx  Name  Route  Sig  Dispense  Refill  . AMBULATORY NON FORMULARY MEDICATION      Progesterone 6% cream Apply 1/2 ml to inner  arms or inner thighs daily Qty-15         . amoxicillin (AMOXIL) 875 MG tablet   Oral   Take 1 tablet (875 mg total) by mouth 2 (two) times daily. (Rx void after 03/03/12)   20 tablet   0   . benzonatate (TESSALON) 200 MG capsule   Oral   Take 1 capsule (200 mg total) by mouth at bedtime. Take as needed for cough   12 capsule   0   . cetirizine (ZYRTEC) 10 MG tablet   Oral   Take 10 mg by mouth daily.           Marland Kitchen FLUoxetine (PROZAC) 40 MG capsule      TAKE 1 CAPSULE BY MOUTH DAILY   90 capsule   0     Patient will need to call and make an appointment.   Marland Kitchen HYDROcodone-acetaminophen (NORCO/VICODIN) 5-325 MG per tablet   Oral   Take 1 tablet by mouth every 6 (six) hours as needed for pain.   40 tablet   0   . methylphenidate (CONCERTA) 27 MG CR tablet   Oral   Take 27 mg  by mouth every morning.         Marland Kitchen EXPIRED: methylphenidate (CONCERTA) 36 MG CR tablet   Oral   Take 1 tablet (36 mg total) by mouth daily.   30 tablet   0   . Misc Natural Products (PROGESTERONE EX)   Apply externally   Apply topically daily. 6% cream. Apply half an mL to inner arm or upper thigh at bedtime.          . mometasone (NASONEX) 50 MCG/ACT nasal spray   Nasal   Place 2 sprays into the nose daily.   17 g   2   . NONFORMULARY/COMPOUNDED ITEM   Topical   Apply 0.5 each topically daily. Biest (80:20) 5mg /ml cream. Apply half an mL to her inner arm or upper thigh once daily.         . predniSONE (DELTASONE) 20 MG tablet   Oral   Take 1 tablet (20 mg total) by mouth 2 (two) times daily.   10 tablet   0   . progesterone (PROMETRIUM) 100 MG capsule   Oral   Take 100 mg by mouth at bedtime.           . traZODone (DESYREL) 150 MG tablet      TAKE 1 TABLET BY MOUTH EVERY NIGHT AT BEDTIME   90 tablet   0     BP 108/71  Pulse 77  Temp(Src) 98.5 F (36.9 C) (Oral)  Resp 16  Ht 5\' 4"  (1.626 m)  Wt 140 lb 4 oz (63.617 kg)  BMI 24.06 kg/m2  SpO2 98%  LMP  03/17/2010  Physical Exam Nursing notes and Vital Signs reviewed. Appearance:  Patient appears healthy, stated age, and in no acute distress Eyes:  Pupils are equal, round, and reactive to light and accomodation.  Extraocular movement is intact.  Conjunctivae are not inflamed  Ears:  Canals normal.  Tympanic membranes normal.  Nose:  Mildly congested turbinates.  No sinus tenderness.   Pharynx:  Normal Neck:  Supple.  No adenopathy Lungs:  Clear to auscultation.  Breath sounds are equal.  Heart:  Regular rate and rhythm without murmurs, rubs, or gallops.  Abdomen:  Nontender without masses or hepatosplenomegaly.  Bowel sounds are present.  No CVA or flank tenderness.  Extremities:  No edema.  No calf tenderness Skin:  No rash present.   ED Course  Procedures  none      1. Acute upper respiratory infections of unspecified site       MDM  There is no evidence of bacterial infection today.   Treat symptomatically for now.  Rx for prednisone burst Take Mucinex D (guaifenesin with decongestant) twice daily for congestion.  Increase fluid intake, rest. May use Afrin nasal spray (or generic oxymetazoline) twice daily for about 5 days.  Also recommend using saline nasal spray several times daily and saline nasal irrigation (AYR is a common brand).  Use Nasonex after Afrin spray and saline irrigation Stop all antihistamines for now, and other non-prescription cough/cold preparations. May take Ibuprofen 200mg , 4 tabs every 8 hours with food for chest/sternum discomfort. Begin Amoxicillin if not improving about 5 days or if persistent fever develops (Given a prescription to hold, with an expiration date)  Follow-up with family doctor if not improving 7 to 10 days.         Lattie Haw, MD 02/29/12 2209

## 2012-02-24 NOTE — ED Notes (Signed)
Pt c/o cough, congestion, and dizziness x 10 days. Has tried OTC Dayquil, Mucinex D, Ibuprofen, and Cough Med RX with no relief.

## 2012-03-11 ENCOUNTER — Ambulatory Visit: Payer: BC Managed Care – PPO | Admitting: Physician Assistant

## 2012-04-27 ENCOUNTER — Emergency Department (INDEPENDENT_AMBULATORY_CARE_PROVIDER_SITE_OTHER): Payer: BC Managed Care – PPO

## 2012-04-27 ENCOUNTER — Encounter: Payer: Self-pay | Admitting: Emergency Medicine

## 2012-04-27 ENCOUNTER — Emergency Department
Admission: EM | Admit: 2012-04-27 | Discharge: 2012-04-27 | Disposition: A | Discharge status: Home / Self Care | Payer: BC Managed Care – PPO | Source: Home / Self Care | Attending: Family Medicine | Admitting: Family Medicine

## 2012-04-27 DIAGNOSIS — J189 Pneumonia, unspecified organism: Secondary | ICD-10-CM

## 2012-04-27 DIAGNOSIS — S20219A Contusion of unspecified front wall of thorax, initial encounter: Secondary | ICD-10-CM

## 2012-04-27 LAB — POCT URINALYSIS DIPSTICK
Blood, UA: NEGATIVE
Glucose, UA: NEGATIVE
Ketones, UA: NEGATIVE
Spec Grav, UA: >=1.03 (ref 1.005–1.03)
Urobilinogen, UA: 0.2 (ref 0–1)

## 2012-04-27 LAB — POCT CBC W AUTO DIFF (K'VILLE URGENT CARE)

## 2012-04-27 LAB — POCT INFLUENZA A/B
Influenza A, POC: NEGATIVE
Influenza B, POC: NEGATIVE

## 2012-04-27 MED ORDER — CEFTRIAXONE SODIUM 1 G IJ SOLR
1.0000 g | Freq: Once | INTRAMUSCULAR | Status: AC
  Administered 2012-04-27: 1 g via INTRAMUSCULAR

## 2012-04-27 MED ORDER — CEFDINIR 300 MG PO CAPS: 300.0000 mg | ORAL_CAPSULE | Freq: Two times a day (BID) | ORAL | Status: DC

## 2012-04-27 MED ORDER — HYDROCODONE-ACETAMINOPHEN 5-325 MG PO TABS: ORAL_TABLET | ORAL | Status: DC

## 2012-04-27 NOTE — ED Notes (Signed)
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 Took Hydrocodone with ibuprofen this a.m.  Had brief spell of nausea while being triaged.

## 2012-04-27 NOTE — ED Provider Notes (Signed)
History     CSN: [id number]  Arrival date & time 04/27/12  1042   First MD Initiated Contact with Patient 04/27/12 1105      Chief Complaint  Patient presents with  . Chest Pain  . Headache  . Generalized Body Aches      HPI Comments: Yesterday morning patient developed sudden onset vague upper body skin sensitivity, followed by fever to 101 at 9:30am.  She had chills/sweats and myalgias throughout the day, and later developed pleuritic right lateral chest pain.  She denies cough however.  Last night she had nausea and an episode of vomiting.  She has had persistent fatigue and had difficulty sleeping last night.  She has had influenza immunization for this season.  Past history of pneumonia as a child.  The history is provided by the patient.    Past Medical History  Diagnosis Date  . Depression   . Allergy   . Diverticulitis   . IBS (irritable bowel syndrome)     Past Surgical History  Procedure Laterality Date  . Neuroma surgery      Right foot  . Dilation and curettage of uterus      Family History  Problem Relation Age of Onset  . Alcohol abuse Mother   . COPD Mother   . Hypertension Father   . Other Father 86    AMI/ CVA at 98  . Stroke Father     History  Substance Use Topics  . Smoking status: Former Games developer  . Smokeless tobacco: Not on file  . Alcohol Use: Yes     Comment: 1 drink/month    OB History   Grav Para Term Preterm Abortions TAB SAB Ect Mult Living   1 0   1           Review of Systems No sore throat No cough ? pleuritic pain No wheezing No nasal congestion No post-nasal drainage No sinus pain/pressure No itchy/red eyes No earache No hemoptysis No SOB + fever, + chills + nausea No vomiting No abdominal pain No diarrhea No urinary symptoms No skin rashes + fatigue + myalgias + headache Used OTC meds without relief  Allergies  Biaxin  Home Medications   Current Outpatient Rx  Name  Route  Sig  Dispense  Refill   . AMBULATORY NON FORMULARY MEDICATION      Progesterone 6% cream Apply 1/2 ml to inner arms or inner thighs daily Qty-15         . cefdinir (OMNICEF) 300 MG capsule   Oral   Take 1 capsule (300 mg total) by mouth 2 (two) times daily.   20 capsule   0   . cetirizine (ZYRTEC) 10 MG tablet   Oral   Take 10 mg by mouth daily.           Marland Kitchen FLUoxetine (PROZAC) 40 MG capsule      TAKE 1 CAPSULE BY MOUTH DAILY   90 capsule   0     Patient will need to call and make an appointment.   Marland Kitchen HYDROcodone-acetaminophen (NORCO/VICODIN) 5-325 MG per tablet      Take one tab by mouth at bedtime as needed for pain   10 tablet   0   . methylphenidate (CONCERTA) 27 MG CR tablet   Oral   Take 27 mg by mouth every morning.         Marland Kitchen EXPIRED: methylphenidate (CONCERTA) 36 MG CR tablet   Oral   Take  1 tablet (36 mg total) by mouth daily.   30 tablet   0   . Misc Natural Products (PROGESTERONE EX)   Apply externally   Apply topically daily. 6% cream. Apply half an mL to inner arm or upper thigh at bedtime.          . mometasone (NASONEX) 50 MCG/ACT nasal spray   Nasal   Place 2 sprays into the nose daily.   17 g   2   . NONFORMULARY/COMPOUNDED ITEM   Topical   Apply 0.5 each topically daily. Biest (80:20) 5mg /ml cream. Apply half an mL to her inner arm or upper thigh once daily.         . progesterone (PROMETRIUM) 100 MG capsule   Oral   Take 100 mg by mouth at bedtime.           . traZODone (DESYREL) 150 MG tablet      TAKE 1 TABLET BY MOUTH EVERY NIGHT AT BEDTIME   90 tablet   0     BP 79/55  Temp(Src) 97.7 F (36.5 C) (Oral)  Resp 16  Ht 5' 4.5" (1.638 m)  Wt 140 lb (63.504 kg)  BMI 23.67 kg/m2  SpO2 96%  LMP 03/17/2010  Physical Exam Nursing notes and Vital Signs reviewed. Appearance:  Patient appears healthy, alert, stated age, and in no acute distress Eyes:  Pupils are equal, round, and reactive to light and accomodation.  Extraocular movement is  intact.  Conjunctivae are not inflamed  Ears:  Canals normal.  Tympanic membranes normal.  Nose:  Mildly congested turbinates.  No sinus tenderness.   Pharynx:  Normal; moist mucous membranes  Neck:  Supple.  Slightly tender shotty posterior nodes are palpated bilaterally   Lungs:  Clear to auscultation.  Breath sounds are equal. Chest:  Mild tenderness to palpation over the mid-sternum.  Mild tenderness right posterior/lateral chest.   Heart:  Regular rate and rhythm without murmurs, rubs, or gallops.  Abdomen:  Nontender without masses or hepatosplenomegaly.  Bowel sounds are present.  Right flank tenderness is present.  Extremities:  No edema.  No calf tenderness Skin:  No rash present.   ED Course  Procedures  none  Labs Reviewed  POCT CBC W AUTO DIFF ([facility])   WBC 19.4; LY 7.2; MO 2.1; GR 90.7; Hgb 13.5; Platelets 192   POCT URINALYSIS DIPSTICK BIL small; SG >= 1.030; PRO 100mg /dL; otherwise negative  POCT INFLUENZA A/B negative   Dg Chest 2 View  04/27/2012  *RADIOLOGY REPORT*  Clinical Data: Right sided pleuritic chest pain since yesterday.  CHEST - 2 VIEW  Comparison: One-view chest x-ray 11/15/2011 and two-view chest x- ray 03/26/2009.  Findings: Airspace consolidation in the posterior medial right lower lobe.  Lungs otherwise clear.  Bronchovascular markings normal.  Possible small right pleural effusion.  No left pleural effusion. Cardiomediastinal silhouette unremarkable, unchanged. Slight thoracic scoliosis convex right again noted.  IMPRESSION: Right lower lobe pneumonia.   Original Report Authenticated By: Hulan Saas, M.D.      1. Right lower lobe pneumonia   .       MDM  Rocephin 1gm IM.  Tomorrow begin [employer]. Lortab for pain at bedtime. Take plain Mucinex (guaifenesin) twice daily for cough and congestion.  Increase fluid intake, rest. Stop all antihistamines for now, and other non-prescription cough/cold preparations. Check temperature daily.   May take Ibuprofen 200mg , 4 tabs every 8 hours with food for fever, body aches, headache, etc. If  symptoms become significantly worse during the night or over the weekend, proceed to the local emergency room. Followup with PCP in about 3 days, and again in about 10 to 12 days for repeat chest x-ray.        Lattie Haw, MD 04/27/12 1538

## 2012-04-29 ENCOUNTER — Telehealth: Payer: Self-pay | Admitting: *Deleted

## 2012-05-01 ENCOUNTER — Encounter: Payer: Self-pay | Admitting: Family Medicine

## 2012-05-01 ENCOUNTER — Ambulatory Visit (INDEPENDENT_AMBULATORY_CARE_PROVIDER_SITE_OTHER): Payer: BC Managed Care – PPO | Admitting: Family Medicine

## 2012-05-01 VITALS — BP 137/76 | HR 70 | Temp 98.0°F | Wt 137.0 lb

## 2012-05-01 DIAGNOSIS — J189 Pneumonia, unspecified organism: Secondary | ICD-10-CM

## 2012-05-01 DIAGNOSIS — B359 Dermatophytosis, unspecified: Secondary | ICD-10-CM

## 2012-05-01 MED ORDER — MOXIFLOXACIN HCL 400 MG PO TABS: 400.0000 mg | ORAL_TABLET | Freq: Every day | ORAL | Status: AC

## 2012-05-01 MED ORDER — CLOTRIMAZOLE 1 % EX CREA: TOPICAL_CREAM | CUTANEOUS | Status: AC

## 2012-05-01 NOTE — Progress Notes (Signed)
CC: Shirley Moody is a 52 y.o. female is here for Follow-up   Subjective: HPI:  Patient returns for followup of pneumonia. Was found to have right lobar pneumonia on the 12th of this month at urgent care. Given 1 g of Rocephin has been taking cefdinir twice a day ever since. 24 hours after the injection she reports improvement in chest wall pain and energy level. Since then she reports slow if any improvement in fatigue, myalgias, headaches. She reports that she has been going to school the past 2 days but had to leave today at 1 PM due to feeling fatigued and subjective fever. In her own words "I feel better but still somehow I don't". Primary symptoms are currently fatigue, occasional subjective fever, myalgias involving the back. She admits decreased caloric intake but not for any particular reason, she describes some apprehension about advancing her diet may flare of her IBS.  She reports sleeping during most of her time off but does not feel it's restorative. Interestingly she denies cough prior to diagnosis nor any now. She denies shortness of breath, wheezing, painful breathing, orthopnea, peripheral edema, confusion.  She reports over the last 24 hours she's had some lymph node swelling in her neck. Denies facial pain, dizziness, runny nose, hearing loss, chest pain, abdominal plan, nausea, diarrhea, or constipation.  She admits that she does have her rash on her right side is itchy and has been present for about a week, not improving with Neosporin or hydrocortisone cream  Review Of Systems Outlined In HPI  Past Medical History  Diagnosis Date  . Depression   . Allergy   . Diverticulitis   . IBS (irritable bowel syndrome)      Family History  Problem Relation Age of Onset  . Alcohol abuse Mother   . COPD Mother   . Hypertension Father   . Other Father 62    AMI/ CVA at 43  . Stroke Father      History  Substance Use Topics  . Smoking status: Former Games developer  . Smokeless  tobacco: Not on file  . Alcohol Use: Yes     Comment: 1 drink/month     Objective: Filed Vitals:   05/01/12 1630  BP: 137/76  Pulse: 70  Temp: 98 F (36.7 C)    General: Alert and Oriented, No Acute Distress HEENT: Pupils equal, round, reactive to light. Conjunctivae clear.  External ears unremarkable, canals clear with intact TMs with appropriate landmarks.  Middle ear appears open without effusion. Pink inferior turbinates.  Moist mucous membranes, pharynx without inflammation nor lesions.  Neck supple without palpable lymphadenopathy nor abnormal masses. Lungs: Right lower lobe rails without rhonchi nor wheezing nor rails elsewhere. Comfortable work of breathing Cardiac: Regular rate and rhythm. Normal S1/S2.  No murmurs, rubs, nor gallops.   Abdomen: Soft nontender Extremities: No peripheral edema.  Strong peripheral pulses.  Mental Status: No depression, anxiety, nor agitation. Skin: Warm and dry. 2 cm circular erythematous and scaly lesion on the right thigh with central sparing  Assessment & Plan: Shirley Moody was seen today for follow-up.  Diagnoses and associated orders for this visit:  CAP (community acquired pneumonia) - moxifloxacin (AVELOX) 400 MG tablet; Take 1 tablet (400 mg total) by mouth daily.  Ringworm - clotrimazole (LOTRIMIN) 1 % cream; Apply to affected areas twice a day for up to four weeks, applying up to two weeks after resolution of symptoms.    Community acquired pneumonia: Discussed with patient I would expect that  she be feeling better in a sense of not wanting to leave work early with fatigue and subjective fevers. I've encouraged her to start moxifloxacin instead of continuing with cefdinir.  She would like to try to increase caloric and fluid intake first but is in agreement of starting should there be any decline or lack of improvement. Ringworm: Provided with prescription and instructions for treatment  25 minutes spent face-to-face during visit  today of which at least 50% was counseling or coordinating care regarding community acquired pneumonia and ring worm.   Return in about 2 weeks (around 05/15/2012).

## 2012-05-05 ENCOUNTER — Other Ambulatory Visit: Payer: Self-pay | Admitting: Family Medicine

## 2012-05-13 ENCOUNTER — Telehealth: Payer: Self-pay | Admitting: *Deleted

## 2012-05-13 DIAGNOSIS — R079 Chest pain, unspecified: Secondary | ICD-10-CM

## 2012-05-13 NOTE — Telephone Encounter (Signed)
Pt notified and voiced understanding 

## 2012-05-13 NOTE — Telephone Encounter (Signed)
It's not uncommon to have chest pain for up to a month after her type of pneumonia.  In order to make sure it is not due to an accumulation of fluid she should probably have a repeat chest xray and exam.  I'll put in an order for this so she can have this done prior to an appointment with me or Dr. Judie Petit that she can make at her convenience.

## 2012-05-13 NOTE — Telephone Encounter (Signed)
Patient calls and states she has had pneumonia within the last 3 weeks and she still hurts in her lungs when she breaths. Questions if needs another appointment or more antibiotics. Please advise

## 2012-05-21 ENCOUNTER — Other Ambulatory Visit: Payer: Self-pay | Admitting: Family Medicine

## 2012-05-23 ENCOUNTER — Other Ambulatory Visit: Payer: Self-pay | Admitting: Family Medicine

## 2012-05-23 MED ORDER — AMBULATORY NON FORMULARY MEDICATION: Status: DC

## 2012-05-24 ENCOUNTER — Encounter: Payer: Self-pay | Admitting: *Deleted

## 2012-05-24 ENCOUNTER — Emergency Department
Admission: EM | Admit: 2012-05-24 | Discharge: 2012-05-24 | Disposition: A | Discharge status: Home / Self Care | Payer: BC Managed Care – PPO | Source: Home / Self Care | Attending: Family Medicine | Admitting: Family Medicine

## 2012-05-24 DIAGNOSIS — J069 Acute upper respiratory infection, unspecified: Secondary | ICD-10-CM

## 2012-05-24 DIAGNOSIS — J029 Acute pharyngitis, unspecified: Secondary | ICD-10-CM

## 2012-05-24 LAB — POCT RAPID STREP A (OFFICE): Rapid Strep A Screen: NEGATIVE

## 2012-05-24 MED ORDER — CEFDINIR 300 MG PO CAPS: 300.0000 mg | ORAL_CAPSULE | Freq: Two times a day (BID) | ORAL | Status: DC

## 2012-05-24 NOTE — ED Provider Notes (Signed)
History     CSN: [id number]  Arrival date & time 05/24/12  1614   First MD Initiated Contact with Patient 05/24/12 1631      Chief Complaint  Patient presents with  . Sore Throat      HPI Comments: Patient complains of onset of a sore throat three days ago, primarily on the left.  She has developed a slight cough but no nasal congestion.  She has had a low grade fever. She took some left-over Omnicef from her previous treatment for pneumonia.  The history is provided by the patient.    Past Medical History  Diagnosis Date  . Depression   . Allergy   . Diverticulitis   . IBS (irritable bowel syndrome)     Past Surgical History  Procedure Laterality Date  . Neuroma surgery      Right foot  . Dilation and curettage of uterus      Family History  Problem Relation Age of Onset  . Alcohol abuse Mother   . COPD Mother   . Hypertension Father   . Other Father 50    AMI/ CVA at 48  . Stroke Father     History  Substance Use Topics  . Smoking status: Former Games developer  . Smokeless tobacco: Not on file  . Alcohol Use: Yes     Comment: 1 drink/month    OB History   Grav Para Term Preterm Abortions TAB SAB Ect Mult Living   1 0   1           Review of Systems + sore throat + mild cough No pleuritic pain, but has some mild residual discomfort from recent pneumonia right chest No wheezing No nasal congestion No post-nasal drainage No sinus pain/pressure No itchy/red eyes No earache No hemoptysis No SOB + low grade fever, + chills No nausea No vomiting No abdominal pain No diarrhea No urinary symptoms No skin rashes + fatigue No myalgias No headache    Allergies  Biaxin and Penicillins  Home Medications   Current Outpatient Rx  Name  Route  Sig  Dispense  Refill  . AMBULATORY NON FORMULARY MEDICATION      Progesterone 6% cream Apply 1/2 ml to inner arms or inner thighs daily Qty-15         . AMBULATORY NON FORMULARY MEDICATION     Medication Name: progesterone capsule 1 po QHS   30 capsule   4   . cefdinir (OMNICEF) 300 MG capsule   Oral   Take 1 capsule (300 mg total) by mouth 2 (two) times daily.   20 capsule   0   . cetirizine (ZYRTEC) 10 MG tablet   Oral   Take 10 mg by mouth daily.           . clotrimazole (LOTRIMIN) 1 % cream      Apply to affected areas twice a day for up to four weeks, applying up to two weeks after resolution of symptoms.   30 g   1   . FLUoxetine (PROZAC) 40 MG capsule      TAKE 1 CAPSULE BY MOUTH EVERY DAY   90 capsule   0   . HYDROcodone-acetaminophen (NORCO/VICODIN) 5-325 MG per tablet      Take one tab by mouth at bedtime as needed for pain   10 tablet   0   . Misc Natural Products (PROGESTERONE EX)   Apply externally   Apply topically daily. 6% cream. Apply  half an mL to inner arm or upper thigh at bedtime.          . mometasone (NASONEX) 50 MCG/ACT nasal spray   Nasal   Place 2 sprays into the nose daily.   17 g   2   . NONFORMULARY/COMPOUNDED ITEM   Topical   Apply 0.5 each topically daily. Biest (80:20) 5mg /ml cream. Apply half an mL to her inner arm or upper thigh once daily.         . traZODone (DESYREL) 150 MG tablet      TAKE 1 TABLET BY MOUTH EVERY NIGHT AT BEDTIME   90 tablet   0     BP 113/69  Pulse 64  Temp(Src) 98.1 F (36.7 C) (Oral)  Resp 16  Wt 141 lb (63.957 kg)  BMI 23.84 kg/m2  SpO2 99%  LMP 03/17/2010  Physical Exam Nursing notes and Vital Signs reviewed. Appearance:  Patient appears healthy, stated age, and in no acute distress Eyes:  Pupils are equal, round, and reactive to light and accomodation.  Extraocular movement is intact.  Conjunctivae are not inflamed  Ears:  Canals normal.  Tympanic membranes normal.  Nose:  Mildly congested turbinates.  No sinus tenderness.   Pharynx:   Mildly erythematous on left Neck:  Supple.  Tender shotty anterior/posterior nodes are palpated bilaterally  Lungs:  Clear to  auscultation.  Breath sounds are equal. Chest:  Distinct tenderness to palpation over the mid-sternum.   Heart:  Regular rate and rhythm without murmurs, rubs, or gallops.  Abdomen:  Nontender without masses or hepatosplenomegaly.  Bowel sounds are present.  No CVA or flank tenderness.  Extremities:  No edema.  No calf tenderness Skin:  No rash present.   ED Course  Procedures  none  Labs Reviewed  POCT RAPID STREP A (OFFICE) negative      1. Acute pharyngitis   2. Acute upper respiratory infections of unspecified site; suspect early viral URI       MDM  ? Partly treated strep.  Begin Omnicef. If increasing cough develops, Take Mucinex D (guaifenesin with decongestant) twice daily for congestion.  Increase fluid intake, rest. May use Afrin nasal spray (or generic oxymetazoline) twice daily for about 5 days.  Also recommend using saline nasal spray several times daily and saline nasal irrigation (AYR is a common brand) Stop all antihistamines for now, and other non-prescription cough/cold preparations. May take Delsym Cough Suppressant at bedtime for nighttime cough.  May take Ibuprofen 200mg , 4 tabs every 8 hours with food for sore throat and chest/sternum discomfort. Recommend pneumococcal vaccine when well. Follow-up with family doctor if not improving 7 to 10 days.         Lattie Haw, MD 05/24/12 [phone]

## 2012-05-24 NOTE — ED Notes (Signed)
Pt c/o swollen, painful tonsils x 3 days. Denies fever.

## 2012-08-07 ENCOUNTER — Other Ambulatory Visit: Payer: Self-pay | Admitting: Family Medicine

## 2012-08-28 ENCOUNTER — Other Ambulatory Visit: Payer: Self-pay | Admitting: Family Medicine

## 2012-08-28 ENCOUNTER — Ambulatory Visit (INDEPENDENT_AMBULATORY_CARE_PROVIDER_SITE_OTHER): Payer: BC Managed Care – PPO

## 2012-08-28 ENCOUNTER — Encounter: Payer: Self-pay | Admitting: Family Medicine

## 2012-08-28 ENCOUNTER — Ambulatory Visit (INDEPENDENT_AMBULATORY_CARE_PROVIDER_SITE_OTHER): Payer: BC Managed Care – PPO | Admitting: Family Medicine

## 2012-08-28 VITALS — BP 111/61 | HR 57 | Ht 64.0 in | Wt 134.0 lb

## 2012-08-28 DIAGNOSIS — M545 Low back pain, unspecified: Secondary | ICD-10-CM

## 2012-08-28 DIAGNOSIS — M5137 Other intervertebral disc degeneration, lumbosacral region: Secondary | ICD-10-CM

## 2012-08-28 DIAGNOSIS — M549 Dorsalgia, unspecified: Secondary | ICD-10-CM

## 2012-08-28 DIAGNOSIS — M25511 Pain in right shoulder: Secondary | ICD-10-CM

## 2012-08-28 DIAGNOSIS — M25519 Pain in unspecified shoulder: Secondary | ICD-10-CM

## 2012-08-28 DIAGNOSIS — Z23 Encounter for immunization: Secondary | ICD-10-CM

## 2012-08-28 MED ORDER — DICLOFENAC SODIUM 75 MG PO TBEC: 75.0000 mg | DELAYED_RELEASE_TABLET | Freq: Two times a day (BID) | ORAL | Status: DC | PRN

## 2012-08-28 NOTE — Progress Notes (Signed)
Subjective:    Patient ID: Shirley Moody, female    DOB: 01/06/1961, 52 y.o.   MRN: [id number]  HPI Back Pain - lower back pain that radiates to her hips, bilat x 6 months.  Has been going to massage therapy every 2 weeks. No relief.  Worse at night when sleeping.  Worse when stand.  Low back feels tight and pulls. Had back problems about 16 years ago. Will feel a clikc in her low back and will feel like her legs are going to give out. No weakness in her LE.  Occ uses Aleve and helps some.  No radiation into legs.    Right shoulder pain - pain since April ( ) but no trauama/injury.  Says normally sleeps with arm under her pillow and under her hand. Got to be painful to sleep on it and to put a shirt on it.  No swelling.  No loss of ROM.  Pain is mostly anterolateral and feels deep.  No weakness or numbness in that arm.     Review of Systems BP 111/61  Pulse 57  Ht 5\' 4"  (1.626 m)  Wt 134 lb (60.782 kg)  BMI 22.99 kg/m2  LMP 03/17/2010    Allergies  Allergen Reactions  . Biaxin [Clarithromycin]   . Penicillins     Past Medical History  Diagnosis Date  . Depression   . Allergy   . Diverticulitis   . IBS (irritable bowel syndrome)     Past Surgical History  Procedure Laterality Date  . Neuroma surgery      Right foot  . Dilation and curettage of uterus      History   Social History  . Marital Status: Significant Other    Spouse Name: N/A    Number of Children: N/A  . Years of Education: N/A   Occupational History  . EDUCATOR     school teacher   Social History Main Topics  . Smoking status: Former Games developer  . Smokeless tobacco: Not on file  . Alcohol Use: Yes     Comment: 1 drink/month  . Drug Use: No  . Sexual Activity:    Other Topics Concern  . Not on file   Social History Narrative  . No narrative on file    Family History  Problem Relation Age of Onset  . Alcohol abuse Mother   . COPD Mother   . Hypertension Father   . Other Father 34   AMI/ CVA at 29  . Stroke Father     Outpatient Encounter Prescriptions as of 08/28/2012  Medication Sig Dispense Refill  . AMBULATORY NON FORMULARY MEDICATION Progesterone 6% cream Apply 1/2 ml to inner arms or inner thighs daily Qty-15      . AMBULATORY NON FORMULARY MEDICATION Medication Name: progesterone capsule 1 po QHS  30 capsule  4  . cefdinir (OMNICEF) 300 MG capsule Take 1 capsule (300 mg total) by mouth 2 (two) times daily.  20 capsule  0  . cetirizine (ZYRTEC) 10 MG tablet Take 10 mg by mouth daily.        Marland Kitchen dicyclomine (BENTYL) 20 MG tablet Take 20 mg by mouth 2 (two) times daily as needed.      Marland Kitchen FLUoxetine (PROZAC) 40 MG capsule TAKE 1 CAPSULE BY MOUTH EVERY DAY  90 capsule  0  . HYDROcodone-acetaminophen (NORCO/VICODIN) 5-325 MG per tablet Take one tab by mouth at bedtime as needed for pain  10 tablet  0  . Misc Natural Products (  PROGESTERONE EX) Apply topically daily. 6% cream. Apply half an mL to inner arm or upper thigh at bedtime.       . mometasone (NASONEX) 50 MCG/ACT nasal spray Place 2 sprays into the nose daily.  17 g  2  . NONFORMULARY/COMPOUNDED ITEM Apply 0.5 each topically daily. Biest (80:20) 5mg /ml cream. Apply half an mL to her inner arm or upper thigh once daily.      . traZODone (DESYREL) 150 MG tablet TAKE 1 TABLET BY MOUTH EVERY NIGHT AT BEDTIME.  90 tablet  0  . [DISCONTINUED] diclofenac (VOLTAREN) 75 MG EC tablet Take 1 tablet (75 mg total) by mouth 2 (two) times daily as needed.  60 tablet  1   No facility-administered encounter medications on file as of 08/28/2012.          Objective:   Physical Exam  Constitutional: She appears well-developed and well-nourished.  Musculoskeletal:  Low back with normal flexion, extension, rotation right and left and side bending. She did start to have pain at about 45 of flexion and she did have pain with extension as well. She is mildly tender over the lower thoracic spine and the SI joints bilaterally. Negative  straight leg raise bilaterally. Hip, knee, ankle strength is 5 out of 5 bilaterally. Neck with normal flexion, extension, rotation right and left and side bending. She is nontender over the cervical spine. Right shoulder with normal extension. She does have slightly decreased internal rotation when trying to reach her upper back compared to her left arm. She is mildly tender over the a.c. joint and the upper deltoid. Negative crossover test. Strength is 5 out of 5 in the shoulder, elbow and wrist. Negative speed test and negative Yergason's test.  Skin: Skin is warm and dry.  Psychiatric: She has a normal mood and affect. Her behavior is normal.          Assessment & Plan:  Low back pain-since her pain has been persistent for 6 months and she has failed conservative therapy with NSAIDs, heat and massage I think it's reasonable to get an x-ray today. I will call in a prescription anti-inflammatory for her to try. Continue gentle stretching. No sign of sciatica.  Right shoulder pain-unclear etiology at this point. Consider bursitis versus a.c. joint inflammation. No sign of biceps tendinitis or rotator cuff syndrome. Will get x-ray as well since his been going on for 4 months with no trauma.

## 2012-09-13 ENCOUNTER — Ambulatory Visit (INDEPENDENT_AMBULATORY_CARE_PROVIDER_SITE_OTHER): Payer: BC Managed Care – PPO | Admitting: Family Medicine

## 2012-09-13 ENCOUNTER — Encounter: Payer: Self-pay | Admitting: Family Medicine

## 2012-09-13 VITALS — BP 124/77 | HR 62 | Wt 138.0 lb

## 2012-09-13 DIAGNOSIS — M545 Low back pain, unspecified: Secondary | ICD-10-CM

## 2012-09-13 DIAGNOSIS — F909 Attention-deficit hyperactivity disorder, unspecified type: Secondary | ICD-10-CM

## 2012-09-13 DIAGNOSIS — F3289 Other specified depressive episodes: Secondary | ICD-10-CM

## 2012-09-13 DIAGNOSIS — M25511 Pain in right shoulder: Secondary | ICD-10-CM

## 2012-09-13 DIAGNOSIS — F32A Depression, unspecified: Secondary | ICD-10-CM

## 2012-09-13 DIAGNOSIS — M25519 Pain in unspecified shoulder: Secondary | ICD-10-CM

## 2012-09-13 DIAGNOSIS — F329 Major depressive disorder, single episode, unspecified: Secondary | ICD-10-CM

## 2012-09-13 DIAGNOSIS — G4700 Insomnia, unspecified: Secondary | ICD-10-CM

## 2012-09-13 MED ORDER — HYDROXYZINE HCL 50 MG PO TABS: 50.0000 mg | ORAL_TABLET | Freq: Three times a day (TID) | ORAL | Status: DC | PRN

## 2012-09-13 MED ORDER — AMPHETAMINE-DEXTROAMPHET ER 15 MG PO CP24: 15.0000 mg | ORAL_CAPSULE | ORAL | Status: DC

## 2012-09-13 NOTE — Progress Notes (Signed)
Subjective:    Patient ID: Shirley Moody, female    DOB: [birth year], 52 y.o.   MRN: [id number]  HPI Trazodone doesn't seem to be working well. She has been in it for 11 years. Sometimes works well. She is currently taking 150mg  at betime. Will occ wake up at midnight and take an extra half or quarter tab. Says was on trazodone 300mg  years ago. She says it's frustrating to go and get an extra tablet this and she is to get out of bed and go to the kitchen and cut it.  ADHD-she wonders if this could also be contributing to how she's been feeling. She just feels very apathetic. She doesn't feel motivated at all. She says she just wants to do the bare minimum and that's it. She said she used to enjoy cooking and doesn't even want to do that. She then had a new port within the summer and hasn't wanted to get in and swim. She says she doesn't want with her dog which she normally enjoys. She still taking her fluoxetine 40 mg. 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 She finds this extremely stressful. She still enjoys teaching but finds the environment very stressful. She denies any chest pain or shortness of breath  Depression - See above. Taking her meds regularly.   Right shoulder pain and low back pain-she did get her x-rays. We'll call her with the results but she says the time she was at the pharmacy and really couldn't hear the results.   Review of Systems     Objective:   Physical Exam  Constitutional: She is oriented to person, place, and time. She appears well-developed and well-nourished.  HENT:  Head: Normocephalic and atraumatic.  Cardiovascular: Normal rate, regular rhythm and normal heart sounds.   Pulmonary/Chest: Effort normal and breath sounds normal.  Neurological: She is alert and oriented to person, place, and time.  Skin: Skin is warm and dry.  Psychiatric: She has a normal mood and affect. Her behavior is normal.          Assessment & Plan:   Insomnia - we discussed maybe trying keeping a whole bottle with a split tabs of the trazodone near her bedside, as long as this is safe in her home she has a young children. That way and then having to get up and Mr. per sleep even more she can just take an extra she has not fallen asleep at 11:30 or midnight. If she takes it too late and she feels groggy the next morning. Also sent her prescription for Vistaril that she could maybe add to the trazodone if needed to see if this is helpful. Again warned about potential for morning grogginess and sedation and to be careful using it especially the first couple times until she notes how she feels on it.  ADHD-the only medication she has never tried his Concerta. She says she didn't like how it made her feel. It made her very anxious and caused her to start leaking her lips. She is open to trying something else. We will try Adderall XR 15 mg daily. Stop immediately if any chest pain or shortness of breath or palpitations. If she doesn't tolerate it well then we can consider a non-stimulant, Strattera. We do have starter packs are in the office if we decide to go that route. Next  Depression-I'm not sure if her current symptoms are related to her poor sleep and her ADHD or possibly worsening  depression. That's why would like to see her back in 2 weeks to see how she's doing. If she's not improving then I would like to consider discontinuing her fluoxetine and trying Fetzima.  Right shoulder pain-reviewed x-ray results with her. Copy given. Recommend physical therapy. She would like to try some home physical therapy first and then if she's not improving consider injection maybe formal physical therapy.  Low back pain-reviewed x-ray results with her. Copy given. Recommend physical therapy. She would like to try some home physical therapy first and then if she's not improving consider injection maybe formal physical therapy.

## 2012-09-17 ENCOUNTER — Encounter: Payer: Self-pay | Admitting: Obstetrics & Gynecology

## 2012-09-17 ENCOUNTER — Ambulatory Visit (INDEPENDENT_AMBULATORY_CARE_PROVIDER_SITE_OTHER): Payer: BC Managed Care – PPO | Admitting: Obstetrics & Gynecology

## 2012-09-17 VITALS — BP 126/77 | HR 66 | Resp 16 | Ht 64.0 in | Wt 136.0 lb

## 2012-09-17 DIAGNOSIS — Z01419 Encounter for gynecological examination (general) (routine) without abnormal findings: Secondary | ICD-10-CM

## 2012-09-17 DIAGNOSIS — Z1151 Encounter for screening for human papillomavirus (HPV): Secondary | ICD-10-CM

## 2012-09-17 DIAGNOSIS — F5231 Female orgasmic disorder: Secondary | ICD-10-CM | POA: Insufficient documentation

## 2012-09-17 DIAGNOSIS — Z124 Encounter for screening for malignant neoplasm of cervix: Secondary | ICD-10-CM

## 2012-09-17 NOTE — Progress Notes (Signed)
  Subjective:    Shirley Moody is a 52 y.o. female who presents for an annual exam. The patient has complaints of anorgasmia, continued depression, abdominal pain from IBS and diverticulosis. The patient is sexually active. GYN screening history: last pap: was abnormal: 2012--had LEEP for LSIL and TZ not seen. and last mammogram: patient does not recall results of last mammogram. The patient wears seatbelts: yes. The patient participates in regular exercise: no. Has the patient ever been transfused or tattooed?: not asked. The patient reports that there is not domestic violence in her life. She is in a same sex relationship with "joanna" for 17 years.  They are experiencing relationship problems due to [name]'s depression and anorgasmia.  Menstrual History: OB History   Grav Para Term Preterm Abortions TAB SAB Ect Mult Living   1 0   1            Patient's last menstrual period was 04/05/2011.    The following portions of the patient's history were reviewed and updated as appropriate: allergies, current medications, past family history, past medical history, past social history, past surgical history and problem list.  Review of Systems Pertinent items are noted in HPI.    Objective:   Filed Vitals:   09/17/12 1537  BP: 126/77  Pulse: 66  Resp: 16  Height: 5\' 4"  (1.626 m)  Weight: 136 lb (61.689 kg)      Vitals:  WNL General appearance: alert, cooperative and no distress Head: Normocephalic, without obvious abnormality, atraumatic Eyes: negative Throat: lips, mucosa, and tongue normal; teeth and gums normal Lungs: clear to auscultation bilaterally Breasts: normal appearance, no masses or tenderness, No nipple retraction or dimpling, No nipple discharge or bleeding Heart: regular rate and rhythm Abdomen: soft, non-tender; bowel sounds normal; no masses,  no organomegaly Pelvic: cervix normal in appearance, external genitalia--left .5 x .25 cm had mass in labia majora  (?inclusion cyst), no adnexal masses or tenderness, no bladder tenderness, no cervical motion tenderness, perianal skin: no external genital warts noted, rectovaginal septum normal, urethra without abnormality or discharge, uterus normal size, shape, and consistency and vagina normal without discharge Extremities: no edema, redness or tenderness in the calves or thighs Skin: no lesions or rash Lymph nodes: Axillary adenopathy: none    .    Assessment:    Healthy female exam.  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  All questions answered. Follow up in 2 weeks.

## 2012-09-17 NOTE — Patient Instructions (Signed)
1.   Make appt for vulvar biopsy in 1-2 weeks 2.  Make appt for mammogram 3.  Call digestive health specialists and find out when you need a follow up colonoscopy 4.  F/u with Dr. Darra Lis regarding medications and anorgasmia. 5.  Consider second opinion with psychiatrist if depression is not controlled.

## 2012-09-24 ENCOUNTER — Telehealth: Payer: Self-pay | Admitting: Family Medicine

## 2012-09-24 ENCOUNTER — Telehealth: Payer: Self-pay | Admitting: *Deleted

## 2012-09-24 NOTE — Telephone Encounter (Signed)
Prior auth obtained through [employer] for Adderall XR. Pharmacy notified. Barry Dienes, LPN

## 2012-09-24 NOTE — Telephone Encounter (Signed)
Please call patient. Dr. Penne Lash sent me a note about possibly changing her fluoxetine. If she is interested we could certainly try something like Wellbutrin which has less potential for sexual side effects. Please let me know waht she would like to do.

## 2012-09-24 NOTE — Telephone Encounter (Signed)
Pt approved for Adderall. Approval faxed to pharmacy.  Meyer Cory, LPN

## 2012-09-26 NOTE — Telephone Encounter (Signed)
Left message for pt to return call.Shirley Moody [place]

## 2012-09-27 ENCOUNTER — Ambulatory Visit: Payer: BC Managed Care – PPO | Admitting: Family Medicine

## 2012-10-03 ENCOUNTER — Ambulatory Visit (INDEPENDENT_AMBULATORY_CARE_PROVIDER_SITE_OTHER): Payer: BC Managed Care – PPO | Admitting: Obstetrics & Gynecology

## 2012-10-03 ENCOUNTER — Encounter: Payer: Self-pay | Admitting: Obstetrics & Gynecology

## 2012-10-03 VITALS — BP 117/80 | HR 75 | Resp 16 | Ht 64.0 in | Wt 136.0 lb

## 2012-10-03 DIAGNOSIS — N764 Abscess of vulva: Secondary | ICD-10-CM

## 2012-10-03 DIAGNOSIS — N9089 Other specified noninflammatory disorders of vulva and perineum: Secondary | ICD-10-CM

## 2012-10-03 DIAGNOSIS — N9489 Other specified conditions associated with female genital organs and menstrual cycle: Secondary | ICD-10-CM

## 2012-10-06 NOTE — Progress Notes (Signed)
Pt presents for vulvar biopsy.    VULVAR BIOPSY NOTE  The indications for vulvar biopsy (rule out neoplasia, establish lichen sclerosus diagnosis) were reviewed.   Risks of the biopsy including pain, bleeding, infection, inadequate specimen, scarring and need for additional procedures  were discussed. The patient stated understanding and agreed to undergo procedure today. Consent was signed,  time out performed.  The patient's vulva was prepped with Betadine. 1% lidocaine was injected into the left labia minora. I scapel was used to incise the skin an a cheesy white material was expressed until the cyst collapsed.  There was no bleeding following the procedure.  The patient tolerated the procedure well. Post-procedure instructions  (pelvic rest for one week) were given to the patient. The patient is to call with heavy bleeding, fever greater than 100.4, foul smelling vaginal discharge or other concerns. There was no specimen to send to pathology.

## 2012-10-10 ENCOUNTER — Ambulatory Visit (INDEPENDENT_AMBULATORY_CARE_PROVIDER_SITE_OTHER): Payer: BC Managed Care – PPO

## 2012-10-10 DIAGNOSIS — Z01419 Encounter for gynecological examination (general) (routine) without abnormal findings: Secondary | ICD-10-CM

## 2012-10-10 DIAGNOSIS — Z1231 Encounter for screening mammogram for malignant neoplasm of breast: Secondary | ICD-10-CM

## 2012-10-18 ENCOUNTER — Ambulatory Visit (INDEPENDENT_AMBULATORY_CARE_PROVIDER_SITE_OTHER): Payer: BC Managed Care – PPO | Admitting: Family Medicine

## 2012-10-18 ENCOUNTER — Encounter: Payer: Self-pay | Admitting: Family Medicine

## 2012-10-18 VITALS — BP 135/84 | HR 63 | Wt 133.0 lb

## 2012-10-18 DIAGNOSIS — F411 Generalized anxiety disorder: Secondary | ICD-10-CM

## 2012-10-18 DIAGNOSIS — Z23 Encounter for immunization: Secondary | ICD-10-CM

## 2012-10-18 DIAGNOSIS — J309 Allergic rhinitis, unspecified: Secondary | ICD-10-CM

## 2012-10-18 DIAGNOSIS — F909 Attention-deficit hyperactivity disorder, unspecified type: Secondary | ICD-10-CM

## 2012-10-18 MED ORDER — MOMETASONE FUROATE 50 MCG/ACT NA SUSP: 2.0000 | Freq: Every day | NASAL | Status: DC

## 2012-10-18 MED ORDER — AMPHETAMINE-DEXTROAMPHET ER 15 MG PO CP24: 15.0000 mg | ORAL_CAPSULE | ORAL | Status: DC

## 2012-10-18 MED ORDER — BECLOMETHASONE DIPROPIONATE 80 MCG/ACT NA AERS: 2.0000 | INHALATION_SPRAY | Freq: Every day | NASAL | Status: DC

## 2012-10-18 NOTE — Progress Notes (Signed)
Subjective:    Patient ID: Shirley Moody, female    DOB: [birth year], 52 y.o.   MRN: [id number]  HPI ADHD - No CP, SOB, or palps. Has had a couple of restless night.  tolerating med well.  More focused and getting more done.  Has lost 5 lbs. No skipping meal.  Usually takes around 6AM. Feels much bette then when on concerta.   Allergies - has been getting shots every other week. Throat has been scratchy in the AM.  She has had some chest congestion. She's had a lot of postnasal drip. She has had cough x 2-3 days. She his afraid of pnuemonia. Had it last spring. Had a low grade temp yesterday. No significant sinus congestion or facial pain or pressure or headaches.  Generalized anxiety disorder-she's currently on fluoxetine 40 mg. Unfortunately it causes anorgasmia. She has seen a therapist on and off for the last 25 years. She also has a very strong family history of bipolar and is worried about this. She has an appointment on Thursday with a psychiatrist.. she has taken Wellbutrin in the past and felt like it just didn't work at all for her mood. She did have her really stressful day today where she became angry and tearful. Though sometimes she feels like she cannot cry on the medication and that bothers her.  Review of Systems  BP 135/84  Pulse 63  Wt 133 lb (60.328 kg)  BMI 22.82 kg/m2  LMP 04/05/2011    Allergies  Allergen Reactions  . Biaxin [Clarithromycin]   . Concerta [Methylphenidate] Other (See Comments)    Says affected her sleep, made her jittery and would suck in her lips  . Penicillins     Past Medical History  Diagnosis Date  . Depression   . Allergy   . Diverticulitis   . IBS (irritable bowel syndrome)   . Colon polyp     Past Surgical History  Procedure Laterality Date  . Neuroma surgery      Right foot  . Dilation and curettage of uterus      History   Social History  . Marital Status: Significant Other    Spouse Name: N/A    Number of Children:  N/A  . Years of Education: N/A   Occupational History  . EDUCATOR     school teacher   Social History Main Topics  . Smoking status: Former Games developer  . Smokeless tobacco: Not on file  . Alcohol Use: Yes     Comment: 1 drink/month  . Drug Use: No  . Sexual Activity: Yes   Other Topics Concern  . Not on file   Social History Narrative  . No narrative on file    Family History  Problem Relation Age of Onset  . Alcohol abuse Mother   . COPD Mother   . Hypertension Father   . Other Father 66    AMI/ CVA at 27  . Stroke Father   . Colon cancer Father     Outpatient Encounter Prescriptions as of 10/18/2012  Medication Sig Dispense Refill  . amphetamine-dextroamphetamine (ADDERALL XR) 15 MG 24 hr capsule Take 1 capsule (15 mg total) by mouth every morning.  30 capsule  0  . cetirizine (ZYRTEC) 10 MG tablet Take 10 mg by mouth daily.        . diclofenac (VOLTAREN) 75 MG EC tablet TAKE 1 TABLET BY MOUTH TWICE DAILY AS NEEDED  180 tablet  1  . dicyclomine (BENTYL) 20 MG  tablet Take 20 mg by mouth 2 (two) times daily as needed.      Marland Kitchen FLUoxetine (PROZAC) 40 MG capsule TAKE 1 CAPSULE BY MOUTH EVERY DAY  90 capsule  0  . hydrOXYzine (ATARAX/VISTARIL) 50 MG tablet Take 1 tablet (50 mg total) by mouth 3 (three) times daily as needed for itching.  30 tablet  0  . mometasone (NASONEX) 50 MCG/ACT nasal spray Place 2 sprays into the nose daily.  17 g  3  . traZODone (DESYREL) 150 MG tablet TAKE 1 TABLET BY MOUTH EVERY NIGHT AT BEDTIME.  90 tablet  0  . [DISCONTINUED] amphetamine-dextroamphetamine (ADDERALL XR) 15 MG 24 hr capsule Take 1 capsule (15 mg total) by mouth every morning.  30 capsule  0  . [DISCONTINUED] amphetamine-dextroamphetamine (ADDERALL XR) 15 MG 24 hr capsule Take 1 capsule (15 mg total) by mouth every morning.  30 capsule  0  . [DISCONTINUED] amphetamine-dextroamphetamine (ADDERALL XR) 15 MG 24 hr capsule Take 1 capsule (15 mg total) by mouth every morning.  30 capsule  0  .  [DISCONTINUED] NASONEX 50 MCG/ACT nasal spray       . Beclomethasone Dipropionate (QNASL) 80 MCG/ACT AERS Place 2 sprays into the nose daily.  8.7 g  3   No facility-administered encounter medications on file as of 10/18/2012.          Objective:   Physical Exam  Constitutional: She is oriented to person, place, and time. She appears well-developed and well-nourished.  HENT:  Head: Normocephalic and atraumatic.  Right Ear: External ear normal.  Left Ear: External ear normal.  Nose: Nose normal.  Mouth/Throat: Oropharynx is clear and moist.  TMs and canals are clear.   Eyes: Conjunctivae and EOM are normal. Pupils are equal, round, and reactive to light.  Neck: Neck supple. No thyromegaly present.  Cardiovascular: Normal rate, regular rhythm and normal heart sounds.   Pulmonary/Chest: Effort normal and breath sounds normal. She has no wheezes.  Lymphadenopathy:    She has no cervical adenopathy.  Neurological: She is alert and oriented to person, place, and time.  Skin: Skin is warm and dry.  Psychiatric: She has a normal mood and affect.          Assessment & Plan:  ADHD - Well controlled. Continue current regimen. She has lost a couple pounds but says she still eating we'll continue to keep an eye on this. Followup in approximately 3-4 months. Given 3 prescriptions, one month each, dated for her Adderall. She can also take the medication a little bit earlier if it's causing any problems as far as insomnia is concerned.  Allergic rhinitis - given prescription for 2 nasal to try instead of Nasonex to see if it helps provide additional relief. She could also consider adding Singulair to her antihistamine and nasal steroid spray. She says she's not actually taking an additional pill at this time. I think that most of her chest congestion and post nasal drip is related to her allergies. Continue weekly immunotherapy injections  Generalized anxiety disorder-discussed different  options with her. Unfortunately all of the SSRIs in as in our eyes can potentially cause anorgasmia. Certainly we could consider changing the medication to see if they cause a similar affect or not. She unfortunately did not get to a therapeutic level with Wellbutrin by itself but we could also consider adding a low dose of Wellbutrin to her fluoxetine to see if this helps with the sexual side effects of the drug. She  could certainly try this in the interim until she meets with the psychiatrist next Thursday. She said she still had some Wellbutrin 150 mg X. are all. We could also consider switching her to BuSpar. We'll await to see what psychiatry recommends.

## 2012-10-23 ENCOUNTER — Telehealth: Payer: Self-pay | Admitting: *Deleted

## 2012-10-23 NOTE — Telephone Encounter (Signed)
PA obtained fr [facility] until 10/23/2013. Case ID # [id number].  Meyer Cory, LPN

## 2012-11-07 ENCOUNTER — Other Ambulatory Visit: Payer: Self-pay | Admitting: Family Medicine

## 2012-11-13 ENCOUNTER — Encounter: Payer: Self-pay | Admitting: Physician Assistant

## 2012-11-13 ENCOUNTER — Ambulatory Visit (INDEPENDENT_AMBULATORY_CARE_PROVIDER_SITE_OTHER): Payer: BC Managed Care – PPO

## 2012-11-13 ENCOUNTER — Telehealth: Payer: Self-pay | Admitting: Physician Assistant

## 2012-11-13 ENCOUNTER — Ambulatory Visit (INDEPENDENT_AMBULATORY_CARE_PROVIDER_SITE_OTHER): Payer: BC Managed Care – PPO | Admitting: Physician Assistant

## 2012-11-13 VITALS — BP 117/76 | HR 98 | Wt 133.0 lb

## 2012-11-13 DIAGNOSIS — R1310 Dysphagia, unspecified: Secondary | ICD-10-CM

## 2012-11-13 DIAGNOSIS — R062 Wheezing: Secondary | ICD-10-CM

## 2012-11-13 DIAGNOSIS — R918 Other nonspecific abnormal finding of lung field: Secondary | ICD-10-CM

## 2012-11-13 DIAGNOSIS — R6889 Other general symptoms and signs: Secondary | ICD-10-CM

## 2012-11-13 DIAGNOSIS — R221 Localized swelling, mass and lump, neck: Secondary | ICD-10-CM

## 2012-11-13 MED ORDER — ALBUTEROL SULFATE HFA 108 (90 BASE) MCG/ACT IN AERS: 2.0000 | INHALATION_SPRAY | Freq: Four times a day (QID) | RESPIRATORY_TRACT | Status: DC | PRN

## 2012-11-13 MED ORDER — OMEPRAZOLE 40 MG PO CPDR: 40.0000 mg | DELAYED_RELEASE_CAPSULE | Freq: Every day | ORAL | Status: DC

## 2012-11-13 NOTE — Patient Instructions (Signed)
Esophageal Spasm  Esophageal spasm is an uncoordinated contraction of the muscles of the esophagus (the tube which carries food from your mouth to your stomach). Normally, the muscles of the esophagus alternate between contraction and relaxation starting from the top of the esophagus and working down to the bottom. This moves the food from the mouth to the stomach. In esophageal spasm, all the muscles contract at once. This causes pain and fails to move the food along. As a result, you may have trouble swallowing.   Women are more likely than men to have esophageal spasm. The cause of the spasms is not known. Sometimes eating hot or cold foods triggers the condition and this may be due to an overly sensitive esophagus. This is not an infectious disease and cannot be passed to others.  SYMPTOMS   Symptoms of esophageal spasm may include: chest pain, burning or pain with swallowing, and difficulty swallowing.   DIAGNOSIS   Esophageal spasm can be diagnosed by a test called manometry (pressure studies of the esophagus). In this test, a special tube is inserted down the esophagus. The tube measures the muscle activity of the esophagus. Abnormal contractions mixed with normal movement helps confirm the diagnosis.   A person with a hypersensitive esophagus may be diagnosed by inflating a long balloon in the person's esophagus. If this causes the same symptoms, preventive methods may work.  PREVENTION   Avoid hot or cold foods if that seems to be a trigger.  PROGNOSIS   This condition does not go away, nor is treatment entirely satisfactory. Patients need to be careful of what they eat. They need to continue on medication if a useful one is found. Fortunately, the condition does not get progressively worse as time passes.  Esophageal spasm does not usually lead to more serious problems but sometimes the pain can be disabling. If a person becomes afraid to eat they may become malnourished and lose weight.   TREATMENT   · A  procedure in which instruments of increasing size are inserted through the esophagus to enlarge (dilate) it are used.  · Medications that decrease acid-production of the stomach may be used such as proton-pump inhibitors or H2-blockers.  · Medications of several types can be used to relax the muscles of the esophagus.  · An individual with a hypersensitive esophagus sometimes improves with low doses of medications normally used for depression.  · No treatment for esophageal spasm is effective for everyone. Often several approaches will be tried before one works. In many cases, the symptoms will improve, but will not go away completely.  · For severe cases, relief is obtained two-thirds of the time by cutting the muscles along the entire length of the esophagus. This is a major surgical procedure.  · Your symptoms are usually the best guide to how well the treatment for esophageal spasm works.  SIDE EFFECTS OF TREATMENTS  · Nitrates can cause headaches and low blood pressure.  · Calcium channel blockers can cause:  · Feeling sick to your stomach (nausea).  · Constipation and other side effects.  · Antidepressants can cause side effects that depend on the medication used.  HOME CARE INSTRUCTIONS   · Let your caregiver know if problems are getting worse, or if you get food stuck in your esophagus for longer than 1 hour or as directed and are unable to swallow liquid.  · Take medications as directed and with permission of your caregiver. Ask about what to do if a   medication seems to get stuck in your esophagus. Only take over-the-counter or prescription medicines for pain, discomfort, or fever as directed by your caregiver.  · Soft and liquid foods pass more easily than solid pieces.  SEEK IMMEDIATE MEDICAL CARE IF:   · You develop severe chest pain, especially if the pain is crushing or pressure-like and spreads to the arms, back, neck, or jaw, or if you have sweating, nausea, or shortness of breath. THIS COULD BE AN  EMERGENCY. Do not wait to see if the pain will go away. Get medical help at once. Call 911 or 0 (operator). DO NOT drive yourself to the hospital.  · Your chest pain gets worse and does not go away with rest.  · You have an attack of chest pain lasting longer than usual despite rest and treatment with the medications your physician has prescribed.  · You wake from sleep with chest pain or shortness of breath.  · You feel dizzy or faint.  · You have chest pain, not typical of your usual pain, caused by your esophagus for which you originally saw your caregiver.  MAKE SURE YOU:   · Understand these instructions.  · Will watch your condition.  · Will get help right away if you are not doing well or get worse.  Document Released: 03/25/2002 Document Revised: 03/27/2011 Document Reviewed: 10/08/2007  Exit[employer]® Patient Information ©2014 [employer].

## 2012-11-13 NOTE — Progress Notes (Signed)
  Subjective:    Patient ID: Shirley Moody, female    DOB: 10-Oct-1960, 52 y.o.   MRN: [id number]  HPI Patient is a 52 year old female who presents to the clinic with problems swallowing and something else like it stuck in her chest. Symptoms started Saturday which is approximately 4 days ago. She woke up feeling like she had a lot of drainage in the back of her throat. Later in the day she felt like something became lodged at the back of her throat. Sometimes she feels like she can get it to go down but then it comes right back up but not all the way up still gets caught in throat. Sensation is present every time she swallows. Denies any n/v/d, fever, chills, wheezing or SOB. She has tried pepto bismol, OTC acid reducer. Not helping. Not wanting to eat. Eaten one time since Monday which was a bowl of cereal but tolerated well.     Review of Systems     Objective:   Physical Exam  Constitutional: She is oriented to person, place, and time. She appears well-developed and well-nourished.  HENT:  Head: Normocephalic and atraumatic.  Right Ear: External ear normal.  Left Ear: External ear normal.  Nose: Nose normal.  Mouth/Throat: Oropharynx is clear and moist. No oropharyngeal exudate.  Eyes: Conjunctivae are normal.  Neck: Normal range of motion. Neck supple. No thyromegaly present.  Cardiovascular: Normal rate, regular rhythm and normal heart sounds.   Pulmonary/Chest:  Bilateral wheezing on exam worse over right than left. Pt stated with deep breath she felt movement of "lump".   Lymphadenopathy:    She has no cervical adenopathy.  Neurological: She is alert and oriented to person, place, and time.  Skin: Skin is warm and dry.  Psychiatric: She has a normal mood and affect. Her behavior is normal.          Assessment & Plan:  Wheezing on ausculation/dysphagia/lump in throat-etiology unclear esophageal stricture vs spasm vs reflux. Will get chest xray today. Could be some acid  reflux sent omeprazole to pharmacy to try. Will put in referral for barium swallow to evaluate for stricture or some cause of dysphagia. Encouraged pt to chew foods well and keep to a bland/smooth diet until we fully evaluate. Hindsight if everything normal could order TSH to make sure not enlarged. Will continue to monitor. No hx of thyroid issues.

## 2012-11-13 NOTE — Telephone Encounter (Signed)
Since there was some hyperinflation of lungs and I heard wheezing on exam. Use albuterol inhaler that I am sending to pharmacy every 6-8 hours for a day or two and see if helps with symptoms.

## 2012-11-14 NOTE — Telephone Encounter (Signed)
Left information on vm

## 2012-11-15 ENCOUNTER — Other Ambulatory Visit: Payer: Self-pay | Admitting: Family Medicine

## 2012-12-10 ENCOUNTER — Encounter: Payer: Self-pay | Admitting: Sports Medicine

## 2012-12-10 ENCOUNTER — Ambulatory Visit (INDEPENDENT_AMBULATORY_CARE_PROVIDER_SITE_OTHER): Payer: BC Managed Care – PPO | Admitting: Sports Medicine

## 2012-12-10 ENCOUNTER — Ambulatory Visit: Payer: BC Managed Care – PPO | Admitting: Family Medicine

## 2012-12-10 VITALS — BP 133/85 | HR 72 | Temp 98.4°F | Wt 132.0 lb

## 2012-12-10 DIAGNOSIS — J040 Acute laryngitis: Secondary | ICD-10-CM

## 2012-12-10 MED ORDER — PREDNISONE 50 MG PO TABS: ORAL_TABLET | ORAL | Status: DC

## 2012-12-10 NOTE — Patient Instructions (Signed)
Laryngitis At the top of your windpipe is your voice box. It is the source of your voice. Inside your voice box are 2 bands of muscles called vocal cords. When you breathe, your vocal cords are relaxed and open so that air can get into the lungs. When you decide to say something, these cords come together and vibrate. The sound from these vibrations goes into your throat and comes out through your mouth as sound. Laryngitis is an inflammation of the vocal cords that causes hoarseness, cough, loss of voice, sore throat, and dry throat. Laryngitis can be temporary (acute) or long-term (chronic). Most cases of acute laryngitis improve with time.Chronic laryngitis lasts for more than 3 weeks. CAUSES Laryngitis can often be related to excessive smoking, talking, or yelling, as well as inhalation of toxic fumes and allergies. Acute laryngitis is usually caused by a viral infection, vocal strain, measles or mumps, or bacterial infections. Chronic laryngitis is usually caused by vocal cord strain, vocal cord injury, postnasal drip, growths on the vocal cords, or acid reflux. SYMPTOMS   Cough.  Sore throat.  Dry throat. RISK FACTORS  Respiratory infections.  Exposure to irritating substances, such as cigarette smoke, excessive amounts of alcohol, stomach acids, and workplace chemicals.  Voice trauma, such as vocal cord injury from shouting or speaking too loud. DIAGNOSIS  Your cargiver will perform a physical exam. During the physical exam, your caregiver will examine your throat. The most common sign of laryngitis is hoarseness. Laryngoscopy may be necessary to confirm the diagnosis of this condition. This procedure allows your caregiver to look into the larynx. HOME CARE INSTRUCTIONS  Drink enough fluids to keep your urine clear or pale yellow.  Rest until you no longer have symptoms or as directed by your caregiver.  Breathe in moist air.  Take all medicine as directed by your  caregiver.  Do not smoke.  Talk as little as possible (this includes whispering).  Write on paper instead of talking until your voice is back to normal.  Follow up with your caregiver if your condition has not improved after 10 days. SEEK MEDICAL CARE IF:   You have trouble breathing.  You cough up blood.  You have persistent fever.  You have increasing pain.  You have difficulty swallowing. MAKE SURE YOU:  Understand these instructions.  Will watch your condition.  Will get help right away if you are not doing well or get worse. Document Released: 01/02/2005 Document Revised: 03/27/2011 Document Reviewed: 03/10/2010 Exit[employer] Patient Information 2014 [employer].  

## 2012-12-10 NOTE — Assessment & Plan Note (Signed)
Acute. Prednisone, vocal rest. Return if no better in 2 weeks.

## 2012-12-10 NOTE — Progress Notes (Signed)
Patient ID: Berniece Abid, female   DOB: 1960/04/21, 52 y.o.   MRN: [id number]  Subjective:    CC: Congestion  YNW:GNFA is a 52 y.o  patient who is seen in clinic today with a 2 and half week complaint of congestion. About 2 weeks ago, she was seen here in clinic with a complaint of pain on swallowing. She was started on Prilosec and that helped the symptoms.At that time, the patient also had some congestion.  She denies nausea, vomiting, ear or facial pain. She has a mild headache. She also a non-productive cough that started last week Thursday as well as hoarsness that is getting better. Patient reports that she felt feverish on Friday up till today but never took her temperature. She denies sick contact at home. 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  Past medical history, Surgical history, Family history not pertinant except as noted below, Social history, Allergies, and medications have been entered into the medical record, reviewed, and no changes needed.   Review of Systems: No fevers, chills, night sweats, weight loss, chest pain, or shortness of breath.   Objective:   Vital: Stable  General: Well Developed, well nourished, and in no acute distress. Voice is hoarse. Neuro: Alert and oriented x3, extra-ocular muscles intact, sensation grossly intact.  HEENT: Normocephalic, atraumatic, pupils equal round reactive to light, neck supple, no masses, Lymphadenopathy on the left jugulodigastric node, thyroid nonpalpable. Oropharynx, nasopharynx, external ear canals are unremarkable.  Skin: Warm and dry, no rashes. Cardiac: Regular rate and rhythm, no murmurs rubs or gallops, no lower extremity edema.  Respiratory: Clear to auscultation bilaterally. Not using accessory muscles, speaking in full sentences. No egophony  Impression and Recommendations:    Assessment: This is a 52 y.o  patient who is seen in clinic today with a 2 and half week  complaint of congestion. Laryngitis: Her symptoms are consistent with a viral laryngitis. Given the absence to fever today, crackles on auscultation or dullness to precaution, we think it is unlikely to be pneumonia.   We will start the patient on prednisone and encourage vocal rest. Patient was advised that symptoms might take 2-4 weeks for remission.  Follow up if symptoms aren't getting better.

## 2013-01-08 ENCOUNTER — Emergency Department
Admission: EM | Admit: 2013-01-08 | Discharge: 2013-01-08 | Disposition: A | Discharge status: Home / Self Care | Payer: BC Managed Care – PPO | Source: Home / Self Care | Attending: Emergency Medicine | Admitting: Emergency Medicine

## 2013-01-08 ENCOUNTER — Encounter: Payer: Self-pay | Admitting: Emergency Medicine

## 2013-01-08 DIAGNOSIS — J309 Allergic rhinitis, unspecified: Secondary | ICD-10-CM

## 2013-01-08 DIAGNOSIS — J0100 Acute maxillary sinusitis, unspecified: Secondary | ICD-10-CM

## 2013-01-08 DIAGNOSIS — R059 Cough, unspecified: Secondary | ICD-10-CM

## 2013-01-08 DIAGNOSIS — R05 Cough: Secondary | ICD-10-CM

## 2013-01-08 MED ORDER — MOMETASONE FUROATE 50 MCG/ACT NA SUSP: 2.0000 | Freq: Every day | NASAL | Status: DC

## 2013-01-08 MED ORDER — AZITHROMYCIN 250 MG PO TABS: ORAL_TABLET | ORAL | Status: DC

## 2013-01-08 NOTE — ED Notes (Signed)
Cough x 2 months.  

## 2013-01-08 NOTE — ED Provider Notes (Signed)
CSN: [id number]     Arrival date & time 01/08/13  4098 History   First MD Initiated Contact with Patient 01/08/13 [phone]     Chief Complaint  Patient presents with  . Cough   HPI  About one a half months ago, had cough and chest congestion, seen by her PCP with diagnosis of viral URI and treated with symptomatic care. She states she improved somewhat, but the symptoms never completely went away. Now, complains of one week of worsening sinus congestion discolored rhinorrhea, hacking cough mostly nonproductive, but occasionally yellow sputum. Nonsmoker.  URI HISTORY  Shirley Moody is a 52 y.o. female who complains of onset of cold symptoms for several days.  Have been using over-the-counter treatment which helps a little bit.  No chills/sweats +  Fever  +  Nasal congestion +  Discolored Post-nasal drainage Mild sinus pain/pressure Mild sore throat  +  cough No wheezing Positive chest congestion No hemoptysis No shortness of breath No pleuritic pain  No itchy/red eyes No earache  No nausea No vomiting No abdominal pain No diarrhea  No skin rashes +  Fatigue No myalgias No headache   Past Medical History  Diagnosis Date  . Depression   . Allergy   . Diverticulitis   . IBS (irritable bowel syndrome)   . Colon polyp    Past Surgical History  Procedure Laterality Date  . Neuroma surgery      Right foot  . Dilation and curettage of uterus     Family History  Problem Relation Age of Onset  . Alcohol abuse Mother   . COPD Mother   . Hypertension Father   . Other Father 32    AMI/ CVA at 60  . Stroke Father   . Colon cancer Father    History  Substance Use Topics  . Smoking status: Former Games developer  . Smokeless tobacco: Not on file  . Alcohol Use: Yes     Comment: 1 drink/month   OB History   Grav Para Term Preterm Abortions TAB SAB Ect Mult Living   1 0   1          Review of Systems  All other systems reviewed and are negative.    Allergies  Biaxin;  Concerta; and Penicillins  Home Medications   Current Outpatient Rx  Name  Route  Sig  Dispense  Refill  . amphetamine-dextroamphetamine (ADDERALL XR) 15 MG 24 hr capsule   Oral   Take 1 capsule (15 mg total) by mouth every morning.   30 capsule   0     DO NOT FILL UNTIL 12/18/2012   . azithromycin (ZITHROMAX Z-PAK) 250 MG tablet      Take 2 tablets on day one, then 1 tablet daily on days 2 through 5   1 each   1   . cetirizine (ZYRTEC) 10 MG tablet   Oral   Take 10 mg by mouth daily.           . diclofenac (VOLTAREN) 75 MG EC tablet      TAKE 1 TABLET BY MOUTH TWICE DAILY AS NEEDED   180 tablet   1     **Patient requests 90 days supply**   . FLUoxetine (PROZAC) 40 MG capsule      TAKE 1 CAPSULE BY MOUTH EVERY DAY   90 capsule   0   . mometasone (NASONEX) 50 MCG/ACT nasal spray   Nasal   Place 2 sprays into the nose daily.  17 g   1   . NASONEX 50 MCG/ACT nasal spray      INSTILL 1 SPRAY IN EACH NOSTRIL EVERY DAY   17 g   0     Dispense as written.   Marland Kitchen omeprazole (PRILOSEC) 40 MG capsule   Oral   Take 40 mg by mouth as needed.         . predniSONE (DELTASONE) 50 MG tablet      One tab PO daily for 5 days.   5 tablet   0   . traZODone (DESYREL) 150 MG tablet      TAKE 1 TABLET BY MOUTH EVERY NIGHT AT BEDTIME   90 tablet   0    BP 104/66  Pulse 71  Temp(Src) 98.1 F (36.7 C) (Oral)  Ht 5\' 4"  (1.626 m)  Wt 131 lb (59.421 kg)  BMI 22.47 kg/m2  SpO2 97%  LMP 04/05/2011 Physical Exam  Nursing note and vitals reviewed. Constitutional: She is oriented to person, place, and time. She appears well-developed and well-nourished. No distress.  HENT:  Head: Normocephalic and atraumatic.  Right Ear: Tympanic membrane, external ear and ear canal normal.  Left Ear: Tympanic membrane, external ear and ear canal normal.  Nose: Mucosal edema and rhinorrhea present. Right sinus exhibits maxillary sinus tenderness. Left sinus exhibits maxillary sinus  tenderness.  Mouth/Throat: Oropharynx is clear and moist. No oral lesions. No oropharyngeal exudate.  Eyes: Right eye exhibits no discharge. Left eye exhibits no discharge. No scleral icterus.  Neck: Neck supple.  Cardiovascular: Normal rate, regular rhythm and normal heart sounds.   Pulmonary/Chest: Effort normal. She has no decreased breath sounds. She has no wheezes. She has rhonchi (Few scattered anteriorly). She has no rales.  Lymphadenopathy:    She has no cervical adenopathy.  Neurological: She is alert and oriented to person, place, and time.  Skin: Skin is warm and dry. No rash noted.  Psychiatric: She has a normal mood and affect.    ED Course  Procedures (including critical care time) Labs Review Labs Reviewed - No data to display Imaging Review No results found.  EKG Interpretation    Date/Time:    Ventricular Rate:    PR Interval:    QRS Duration:   QT Interval:    QTC Calculation:   R Axis:     Text Interpretation:              MDM   1. Acute maxillary sinusitis   2. Allergic rhinitis   3. Cough    Discussed with patient. Risks, benefits, alternatives discussed.  Phenergan with codeine, 4 ounces. No refills. Handwritten prescription.--Precautions discussed Z-Pak Nasonex She declined option of prednisone burst Followup with PCP if no better one week, sooner if worse or new symptoms Precautions discussed. Red flags discussed. Questions invited and answered. Patient voiced understanding and agreement.  Lajean Manes, MD 01/08/13 [phone]

## 2013-03-11 ENCOUNTER — Encounter: Payer: Self-pay | Admitting: Sports Medicine

## 2013-03-11 ENCOUNTER — Ambulatory Visit (INDEPENDENT_AMBULATORY_CARE_PROVIDER_SITE_OTHER): Payer: Managed Care, Other (non HMO)

## 2013-03-11 ENCOUNTER — Ambulatory Visit (INDEPENDENT_AMBULATORY_CARE_PROVIDER_SITE_OTHER): Payer: Managed Care, Other (non HMO) | Admitting: Sports Medicine

## 2013-03-11 VITALS — BP 106/70 | HR 70 | Wt 134.0 lb

## 2013-03-11 DIAGNOSIS — M25519 Pain in unspecified shoulder: Secondary | ICD-10-CM

## 2013-03-11 DIAGNOSIS — M7541 Impingement syndrome of right shoulder: Secondary | ICD-10-CM | POA: Insufficient documentation

## 2013-03-11 DIAGNOSIS — M25511 Pain in right shoulder: Secondary | ICD-10-CM

## 2013-03-11 MED ORDER — NAPROXEN 500 MG PO TABS: 500.0000 mg | ORAL_TABLET | Freq: Two times a day (BID) | ORAL | Status: DC

## 2013-03-11 NOTE — Progress Notes (Signed)
   Subjective:    I'm seeing this patient as a consultation for:  Dr. Madilyn Fireman  CC: Shoulder pain  HPI: Patient is a pleasant 53 yo female who presents with shoulder pain. The pain started after throwing a ball in the summer/fall. There was a sharp shooting pain at that time. The pain improved over time, but has gotten worse in the past month. Exercises, NSAIDs, and hot/cold compresses have not helped. She feels the sharp pain all the time now and is having trouble sleeping with the pain. The pain becomes worse with most shoulder movements including external rotation and abduction of the shoulder. She has some radiation down her arm with some numbness and tingling in her hand which started a couple days ago.   Past medical history, Surgical history, Family history not pertinant except as noted below, Social history, Allergies, and medications have been entered into the medical record, reviewed, and no changes needed.   Review of Systems: No headache, visual changes, nausea, vomiting, diarrhea, constipation, dizziness, abdominal pain, skin rash, fevers, chills, night sweats, weight loss, swollen lymph nodes, body aches, joint swelling, muscle aches, chest pain, shortness of breath, mood changes, visual or auditory hallucinations.   Objective:   General: Well Developed, well nourished, and in no acute distress.  Neuro/Psych: Alert and oriented x3, extra-ocular muscles intact, able to move all 4 extremities, sensation grossly intact. Skin: Warm and dry, no rashes noted.  Respiratory: Not using accessory muscles, speaking in full sentences, trachea midline.  Cardiovascular: Pulses palpable, no extremity edema. Abdomen: Does not appear distended. Shoulder: Non-erythematous, no edema. Tender to palpation. Positive Crank, Neer, Lift off, Jobe empty can, and Hawkins test on a positive clunk test, no translational instability.  Procedure: Real-time Ultrasound Guided Injection of right subacromial bursa    Device: GE Logiq E  Verbal informed consent obtained.  Time-out conducted.  Noted no overlying erythema, induration, or other signs of local infection.  Skin prepped in a sterile fashion.  Local anesthesia: Topical Ethyl chloride.  With sterile technique and under real time ultrasound guidance:  1 cc Kenalog 40, 4 cc lidocaine injected easily into the subacromial bursa. Completed without difficulty  Pain immediately resolved suggesting accurate placement of the medication.  Advised to call if fevers/chills, erythema, induration, drainage, or persistent bleeding.  Images permanently stored and available for review in the ultrasound unit.  Impression: Technically successful ultrasound guided injection.  Procedure: Real-time Ultrasound Guided Injection of right glenohumeral joint Device: GE Logiq E  Verbal informed consent obtained.  Time-out conducted.  Noted no overlying erythema, induration, or other signs of local infection.  Skin prepped in a sterile fashion.  Local anesthesia: Topical Ethyl chloride.  With sterile technique and under real time ultrasound guidance:  Spinal needle advanced into the glenohumeral joint, 1 cc Kenalog 40, 4 cc lidocaine injected easily. Completed without difficulty  Pain immediately resolved suggesting accurate placement of the medication.  Advised to call if fevers/chills, erythema, induration, drainage, or persistent bleeding.  Images permanently stored and available for review in the ultrasound unit.  Impression: Technically successful ultrasound guided injection.  Shoulder x-rays were personally reviewed and are negative.  Impression and Recommendations:   This case required medical decision making of moderate complexity.

## 2013-03-11 NOTE — Assessment & Plan Note (Signed)
Clinically there were both rotator cuff signs as well as labral signs without significant mechanical symptoms. Injections placed into the subacromial bursa as well as glenohumeral joint. X-rays, naproxen, formal physical therapy. Return to see me in approximately one month, MRI if no better.

## 2013-03-19 ENCOUNTER — Ambulatory Visit (INDEPENDENT_AMBULATORY_CARE_PROVIDER_SITE_OTHER): Payer: Managed Care, Other (non HMO) | Admitting: Physical Therapy

## 2013-03-19 DIAGNOSIS — R293 Abnormal posture: Secondary | ICD-10-CM

## 2013-03-19 DIAGNOSIS — M25619 Stiffness of unspecified shoulder, not elsewhere classified: Secondary | ICD-10-CM

## 2013-03-19 DIAGNOSIS — M25519 Pain in unspecified shoulder: Secondary | ICD-10-CM

## 2013-03-26 ENCOUNTER — Encounter (INDEPENDENT_AMBULATORY_CARE_PROVIDER_SITE_OTHER): Payer: Managed Care, Other (non HMO)

## 2013-03-26 DIAGNOSIS — M25519 Pain in unspecified shoulder: Secondary | ICD-10-CM

## 2013-03-26 DIAGNOSIS — R293 Abnormal posture: Secondary | ICD-10-CM

## 2013-03-26 DIAGNOSIS — M25619 Stiffness of unspecified shoulder, not elsewhere classified: Secondary | ICD-10-CM

## 2013-03-28 ENCOUNTER — Encounter (INDEPENDENT_AMBULATORY_CARE_PROVIDER_SITE_OTHER): Payer: Managed Care, Other (non HMO) | Admitting: Physical Therapy

## 2013-03-28 DIAGNOSIS — M25619 Stiffness of unspecified shoulder, not elsewhere classified: Secondary | ICD-10-CM

## 2013-03-28 DIAGNOSIS — R293 Abnormal posture: Secondary | ICD-10-CM

## 2013-03-28 DIAGNOSIS — M25519 Pain in unspecified shoulder: Secondary | ICD-10-CM

## 2013-04-02 ENCOUNTER — Encounter (INDEPENDENT_AMBULATORY_CARE_PROVIDER_SITE_OTHER): Payer: Managed Care, Other (non HMO) | Admitting: Physical Therapy

## 2013-04-02 DIAGNOSIS — M25619 Stiffness of unspecified shoulder, not elsewhere classified: Secondary | ICD-10-CM

## 2013-04-02 DIAGNOSIS — R293 Abnormal posture: Secondary | ICD-10-CM

## 2013-04-02 DIAGNOSIS — M25519 Pain in unspecified shoulder: Secondary | ICD-10-CM

## 2013-04-04 ENCOUNTER — Encounter (INDEPENDENT_AMBULATORY_CARE_PROVIDER_SITE_OTHER): Payer: Managed Care, Other (non HMO) | Admitting: Physical Therapy

## 2013-04-04 DIAGNOSIS — M25619 Stiffness of unspecified shoulder, not elsewhere classified: Secondary | ICD-10-CM

## 2013-04-04 DIAGNOSIS — M25519 Pain in unspecified shoulder: Secondary | ICD-10-CM

## 2013-04-04 DIAGNOSIS — R293 Abnormal posture: Secondary | ICD-10-CM

## 2013-04-07 ENCOUNTER — Encounter (INDEPENDENT_AMBULATORY_CARE_PROVIDER_SITE_OTHER): Payer: Managed Care, Other (non HMO) | Admitting: Physical Therapy

## 2013-04-07 DIAGNOSIS — M25519 Pain in unspecified shoulder: Secondary | ICD-10-CM

## 2013-04-07 DIAGNOSIS — R293 Abnormal posture: Secondary | ICD-10-CM

## 2013-04-07 DIAGNOSIS — M25619 Stiffness of unspecified shoulder, not elsewhere classified: Secondary | ICD-10-CM

## 2013-04-08 ENCOUNTER — Ambulatory Visit (INDEPENDENT_AMBULATORY_CARE_PROVIDER_SITE_OTHER): Payer: Managed Care, Other (non HMO) | Admitting: Sports Medicine

## 2013-04-08 ENCOUNTER — Encounter: Payer: Self-pay | Admitting: Sports Medicine

## 2013-04-08 VITALS — BP 128/81 | HR 65 | Ht 64.0 in | Wt 133.0 lb

## 2013-04-08 DIAGNOSIS — M25511 Pain in right shoulder: Secondary | ICD-10-CM

## 2013-04-08 DIAGNOSIS — M25519 Pain in unspecified shoulder: Secondary | ICD-10-CM

## 2013-04-08 NOTE — Assessment & Plan Note (Signed)
Pain resolved, return as needed. 

## 2013-04-08 NOTE — Progress Notes (Signed)
  Subjective:    CC: Followup  HPI: Followup shoulder pain, this very pleasant 53 year old female returns, she had thrown a ball and had persistent rotator cuff and labral signs clinically her exam. This did not improve with conservative measures so we injected both her subacromial bursa and gluteal joints, I also placed her into formal physical therapy, she returns today one 100% pain free. She has a few more sessions of PT and then will be completely turned loose.  Past medical history, Surgical history, Family history not pertinant except as noted below, Social history, Allergies, and medications have been entered into the medical record, reviewed, and no changes needed.   Review of Systems: No fevers, chills, night sweats, weight loss, chest pain, or shortness of breath.   Objective:    General: Well Developed, well nourished, and in no acute distress.  Neuro: Alert and oriented x3, extra-ocular muscles intact, sensation grossly intact.  HEENT: Normocephalic, atraumatic, pupils equal round reactive to light, neck supple, no masses, no lymphadenopathy, thyroid nonpalpable.  Skin: Warm and dry, no rashes. Cardiac: Regular rate and rhythm, no murmurs rubs or gallops, no lower extremity edema.  Respiratory: Clear to auscultation bilaterally. Not using accessory muscles, speaking in full sentences. Right Shoulder: Inspection reveals no abnormalities, atrophy or asymmetry. Palpation is normal with no tenderness over AC joint or bicipital groove. ROM is full in all planes. Rotator cuff strength normal throughout. No signs of impingement with negative Neer and Hawkin's tests, empty can sign. Speeds and Yergason's tests normal. No labral pathology noted with negative Obrien's, negative clunk and good stability. Normal scapular function observed. No painful arc and no drop arm sign. No apprehension sign  Impression and Recommendations:

## 2013-04-09 ENCOUNTER — Encounter (INDEPENDENT_AMBULATORY_CARE_PROVIDER_SITE_OTHER): Payer: Managed Care, Other (non HMO)

## 2013-04-09 DIAGNOSIS — R293 Abnormal posture: Secondary | ICD-10-CM

## 2013-04-09 DIAGNOSIS — M25519 Pain in unspecified shoulder: Secondary | ICD-10-CM

## 2013-04-09 DIAGNOSIS — M25619 Stiffness of unspecified shoulder, not elsewhere classified: Secondary | ICD-10-CM

## 2013-04-16 ENCOUNTER — Encounter: Payer: Managed Care, Other (non HMO) | Admitting: Physical Therapy

## 2013-04-18 ENCOUNTER — Encounter: Payer: Managed Care, Other (non HMO) | Admitting: Physical Therapy

## 2013-07-25 ENCOUNTER — Telehealth: Payer: Self-pay | Admitting: *Deleted

## 2013-07-25 NOTE — Telephone Encounter (Signed)
Talaysha stopped in office after trying to do a physical work out downstairs and was physically unable to finish due to pain. She said that Sunday she hit her side on the pool jumping after her dog and when she came down it hit her ribs on the left side under breast and she felt a snap or pop. She said "I think I broke my ribs". Kayle said that she had pain meds that she was using at night to sleep. I told her that until she was not having any discomfort it would be approx 4-6 weeks that there really was not anything that could be done for rib fx's. She asked if she could wrap her ribs and I said that it certainly wouldn't hurt but not to sleep with the wrap on and if she developed SOB, bloody sputum, wheezing or uncontrollable pain that she needed to be seen. I also expressed no strenuous exercise as she does not want to aggravate or cause more injury. I also told her that if she felt she needed to be seen at anytime in the future we would be more than happy to accommodate her. Please advise. Margette Fast, CMA

## 2013-07-29 ENCOUNTER — Other Ambulatory Visit: Payer: Self-pay | Admitting: Family Medicine

## 2013-08-29 ENCOUNTER — Encounter: Payer: Self-pay | Admitting: Physician Assistant

## 2013-08-29 ENCOUNTER — Ambulatory Visit (INDEPENDENT_AMBULATORY_CARE_PROVIDER_SITE_OTHER): Payer: Managed Care, Other (non HMO) | Admitting: Physician Assistant

## 2013-08-29 VITALS — BP 105/62 | HR 70 | Ht 64.0 in | Wt 130.0 lb

## 2013-08-29 DIAGNOSIS — Z0000 Encounter for general adult medical examination without abnormal findings: Secondary | ICD-10-CM

## 2013-08-29 DIAGNOSIS — Z13220 Encounter for screening for lipoid disorders: Secondary | ICD-10-CM

## 2013-08-29 DIAGNOSIS — M791 Myalgia, unspecified site: Secondary | ICD-10-CM

## 2013-08-29 DIAGNOSIS — Z131 Encounter for screening for diabetes mellitus: Secondary | ICD-10-CM

## 2013-08-29 DIAGNOSIS — Z111 Encounter for screening for respiratory tuberculosis: Secondary | ICD-10-CM

## 2013-08-29 DIAGNOSIS — Z23 Encounter for immunization: Secondary | ICD-10-CM

## 2013-08-29 NOTE — Patient Instructions (Signed)

## 2013-08-29 NOTE — Progress Notes (Signed)
   Subjective:    Patient ID: Shirley Moody, female    DOB: 1960-02-16, 53 y.o.   MRN: [id number]  HPI    Review of Systems     Objective:   Physical Exam        Assessment & Plan:   Subjective:     Shirley Moody is a 53 y.o. female and is here for a comprehensive physical exam. The patient reports problems - pt aches all over. she feels like her muscles are tight. she gets tired very often. she denies regular exercise.  she is sometimes tender to touch over her legs. Nothing seems to make better. She takes ibuprofen as needed.   History   Social History  . Marital Status: Significant Other    Spouse Name: N/A    Number of Children: N/A  . Years of Education: N/A   Occupational History  . EDUCATOR     school teacher   Social History Main Topics  . Smoking status: Former Research scientist (life sciences)  . Smokeless tobacco: Not on file  . Alcohol Use: Yes     Comment: 1 drink/month  . Drug Use: No  . Sexual Activity: Yes   Other Topics Concern  . Not on file   Social History Narrative  . No narrative on file   Health Maintenance  Topic Date Due  . Influenza Vaccine  08/17/2014  . Mammogram  10/11/2014  . Pap Smear  09/18/2015  . Colonoscopy  01/28/2019  . Tetanus/tdap  08/29/2022    The following portions of the patient's history were reviewed and updated as appropriate: allergies, current medications, past family history, past medical history, past social history, past surgical history and problem list.  Review of Systems A comprehensive review of systems was negative.   Objective:    BP 105/62  Pulse 70  Ht 5\' 4"  (1.626 m)  Wt 130 lb (58.968 kg)  BMI 22.30 kg/m2  LMP 04/05/2011 General appearance: alert, cooperative and appears stated age Head: Normocephalic, without obvious abnormality, atraumatic Eyes: conjunctivae/corneas clear. PERRL, EOM's intact. Fundi benign. Ears: normal TM's and external ear canals both ears Nose: Nares normal. Septum midline.  Mucosa normal. No drainage or sinus tenderness. Throat: lips, mucosa, and tongue normal; teeth and gums normal Neck: no adenopathy, no carotid bruit, no JVD, supple, symmetrical, trachea midline and thyroid not enlarged, symmetric, no tenderness/mass/nodules Back: symmetric, no curvature. ROM normal. No CVA tenderness. Lungs: clear to auscultation bilaterally Heart: regular rate and rhythm, S1, S2 normal, no murmur, click, rub or gallop Abdomen: soft, non-tender; bowel sounds normal; no masses,  no organomegaly Extremities: extremities normal, atraumatic, no cyanosis or edema Pulses: 2+ and symmetric Skin: Skin color, texture, turgor normal. No rashes or lesions Lymph nodes: Cervical, supraclavicular, and axillary nodes normal. Neurologic: Grossly normal    Assessment:    Healthy female exam.      Plan:    CPE-pap up to date. Flu shot given today. PPD given today. Fasting labs ordered. Discussed exercise and vitamins. Encouraged calcium 1200mg  and vitamin D 800 units.   Myalgias/fatigue- will order labs. Will follow up from there.  See After Visit Summary for Counseling Recommendations

## 2013-09-04 LAB — CBC
HCT: 42.8 % (ref 36.0–46.0)
HEMOGLOBIN: 14.6 g/dL (ref 12.0–15.0)
MCH: 31.6 pg (ref 26.0–34.0)
MCHC: 34.1 g/dL (ref 30.0–36.0)
MCV: 92.6 fL (ref 78.0–100.0)
Platelets: 265 10*3/uL (ref 150–400)
RBC: 4.62 MIL/uL (ref 3.87–5.11)
RDW: 13.6 % (ref 11.5–15.5)
WBC: 3.2 10*3/uL — ABNORMAL LOW (ref 4.0–10.5)

## 2013-09-04 LAB — COMPLETE METABOLIC PANEL WITH GFR
ALT: 16 U/L (ref 0–35)
AST: 19 U/L (ref 0–37)
Albumin: 4.4 g/dL (ref 3.5–5.2)
Alkaline Phosphatase: 55 U/L (ref 39–117)
BUN: 11 mg/dL (ref 6–23)
CALCIUM: 9.6 mg/dL (ref 8.4–10.5)
CHLORIDE: 105 meq/L (ref 96–112)
CO2: 27 meq/L (ref 19–32)
Creat: 0.81 mg/dL (ref 0.50–1.10)
GFR, EST NON AFRICAN AMERICAN: 84 mL/min
Glucose, Bld: 98 mg/dL (ref 70–99)
Potassium: 4.5 mEq/L (ref 3.5–5.3)
SODIUM: 142 meq/L (ref 135–145)
TOTAL PROTEIN: 6.4 g/dL (ref 6.0–8.3)
Total Bilirubin: 0.6 mg/dL (ref 0.2–1.2)

## 2013-09-04 LAB — TSH: TSH: 1.319 u[IU]/mL (ref 0.350–4.500)

## 2013-09-04 LAB — LIPID PANEL
Cholesterol: 203 mg/dL — ABNORMAL HIGH (ref 0–200)
HDL: 62 mg/dL (ref >39)
LDL Cholesterol: 131 mg/dL — ABNORMAL HIGH (ref 0–99)
Total CHOL/HDL Ratio: 3.3 Ratio
Triglycerides: 48 mg/dL (ref <150)
VLDL: 10 mg/dL (ref 0–40)

## 2013-09-04 LAB — VITAMIN B12: Vitamin B-12: 630 pg/mL (ref 211–911)

## 2013-09-04 LAB — CK: Total CK: 86 U/L (ref 7–177)

## 2013-09-04 LAB — FERRITIN: FERRITIN: 60 ng/mL (ref 10–291)

## 2013-09-05 LAB — SEDIMENTATION RATE: SED RATE: 4 mm/h (ref 0–22)

## 2013-09-05 LAB — ANTI-NUCLEAR AB-TITER (ANA TITER)

## 2013-09-05 LAB — ANA: Anti Nuclear Antibody(ANA): POSITIVE — AB

## 2013-09-05 LAB — VITAMIN D 25 HYDROXY (VIT D DEFICIENCY, FRACTURES): Vit D, 25-Hydroxy: 87 ng/mL (ref 30–89)

## 2013-09-08 ENCOUNTER — Encounter: Payer: Self-pay | Admitting: Sports Medicine

## 2013-09-08 ENCOUNTER — Other Ambulatory Visit: Payer: Self-pay | Admitting: Physician Assistant

## 2013-09-08 ENCOUNTER — Ambulatory Visit (INDEPENDENT_AMBULATORY_CARE_PROVIDER_SITE_OTHER): Payer: Managed Care, Other (non HMO) | Admitting: Sports Medicine

## 2013-09-08 VITALS — BP 122/71 | HR 65 | Ht 64.0 in | Wt 133.0 lb

## 2013-09-08 DIAGNOSIS — M25519 Pain in unspecified shoulder: Secondary | ICD-10-CM

## 2013-09-08 DIAGNOSIS — R768 Other specified abnormal immunological findings in serum: Secondary | ICD-10-CM

## 2013-09-08 DIAGNOSIS — IMO0001 Reserved for inherently not codable concepts without codable children: Secondary | ICD-10-CM

## 2013-09-08 DIAGNOSIS — M25511 Pain in right shoulder: Secondary | ICD-10-CM

## 2013-09-08 NOTE — Assessment & Plan Note (Signed)
Last shoulder injection was in January of this year, repeat injection, subacromial and glenohumeral. Return as needed.

## 2013-09-08 NOTE — Progress Notes (Signed)
Patient ID: Shirley Moody, female   DOB: 1961-01-03, 53 y.o.   MRN: [id number]  Subjective:    CC: Right shoulder pain  HPI: Shirley Moody is a pleasant 53 year old female presenting with pain in the right shoulder that radiates to the neck and arm. Some numbness and tingling present in the arm and hand. She was originally seen for this shoulder pain in February, after she had thrown a ball in the summer/fall and experienced a sharp, shooting pain. At that time, she had both rotator cuff and labral signs without significant mechanical symptoms. Injections were placed into the subacromial bursa and glenohumeral joint. This provided pain relief for roughly one month; however, she reports two minor injuries to the right shoulder since that time that have caused an increase in pain. Has tried Naproxen at home, which was minimally helpful.  Past medical history, Surgical history, Family history not pertinant except as noted below, Social history, Allergies, and medications have been entered into the medical record, reviewed, and no changes needed.   Review of Systems: No fevers, chills, night sweats, weight loss, chest pain, or shortness of breath.   Objective:    General: Well Developed, well nourished, and in no acute distress. Visibly uncomfortable throughout the exam. Neuro: Alert and oriented x3, extra-ocular muscles intact, sensation grossly intact.  HEENT: Normocephalic, atraumatic, pupils equal round reactive to light, neck supple, no masses, no lymphadenopathy, thyroid nonpalpable.  Skin: Warm and dry, no rashes. Cardiac: Regular rate and rhythm, no murmurs rubs or gallops, no lower extremity edema.  Respiratory: Clear to auscultation bilaterally. Not using accessory muscles, speaking in full sentences. Right Shoulder: Inspection reveals no abnormalities, atrophy or asymmetry. Palpation reveals some tenderness over the deltoid. No tenderness over AC joint or bicipital groove. ROM is  full in all planes; however, there is pain with motion. Rotator cuff strength normal throughout. Positive Neer and Hawkin's tests. Negative empty can sign. Speeds and Yergason's tests normal. No labral pathology noted with negative Obrien's, negative clunk and good stability. Positive crank test suggesting some glenohumeral pathology. Normal scapular function observed. No painful arc and no drop arm sign. No apprehension sign  Procedure: Real-time Ultrasound Guided Injection of right subacromial bursa Device: GE Logiq E  Verbal informed consent obtained.  Time-out conducted.  Noted no overlying erythema, induration, or other signs of local infection.  Skin prepped in a sterile fashion.  Local anesthesia: Topical Ethyl chloride.  With sterile technique and under real time ultrasound guidance:  Noted distended bursa, 1 cc kenalog 40, 4 cc lidocaine injected easily. Completed without difficulty  Pain immediately resolved suggesting accurate placement of the medication.  Advised to call if fevers/chills, erythema, induration, drainage, or persistent bleeding.  Images permanently stored and available for review in the ultrasound unit.  Impression: Technically successful ultrasound guided injection.  Procedure: Real-time Ultrasound Guided Injection of right glenohumeral joint Device: GE Logiq E  Verbal informed consent obtained.  Time-out conducted.  Noted no overlying erythema, induration, or other signs of local infection.  Skin prepped in a sterile fashion.  Local anesthesia: Topical Ethyl chloride.  With sterile technique and under real time ultrasound guidance: Spinal needle advanced into the glenohumeral joint taking care to avoid the labrum, 1 cc kenalog 40, 4 cc lidocaine injected easily.  Completed without difficulty  Pain immediately resolved suggesting accurate placement of the medication.  Advised to call if fevers/chills, erythema, induration, drainage, or persistent bleeding.   Images permanently stored and available for review in the ultrasound unit.  Impression: Technically successful ultrasound guided injection.   Impression and Recommendations:   Right shoulder pain: Pain with palpation over the deltoid paired with positive Neer and Hawkin's test is suggestive of subacromial bursitis. Osteoarthritis is also likely present, although these changes were not visible in the shoulder x-ray done in February. Last injection was in February, so it is reasonable to repeat the injections today. - Repeat subacromial and glenohumeral injections today - Return as needed

## 2013-10-06 ENCOUNTER — Ambulatory Visit: Payer: Managed Care, Other (non HMO) | Admitting: Sports Medicine

## 2013-10-06 DIAGNOSIS — Z0289 Encounter for other administrative examinations: Secondary | ICD-10-CM

## 2013-11-17 ENCOUNTER — Encounter: Payer: Self-pay | Admitting: Sports Medicine

## 2013-11-19 ENCOUNTER — Encounter: Payer: Self-pay | Admitting: Family Medicine

## 2013-11-19 ENCOUNTER — Ambulatory Visit (INDEPENDENT_AMBULATORY_CARE_PROVIDER_SITE_OTHER): Payer: Managed Care, Other (non HMO) | Admitting: Family Medicine

## 2013-11-19 VITALS — BP 116/69 | HR 78 | Ht 64.0 in | Wt 123.0 lb

## 2013-11-19 DIAGNOSIS — F410 Panic disorder [episodic paroxysmal anxiety] without agoraphobia: Secondary | ICD-10-CM

## 2013-11-19 DIAGNOSIS — F411 Generalized anxiety disorder: Secondary | ICD-10-CM

## 2013-11-19 DIAGNOSIS — F430 Acute stress reaction: Secondary | ICD-10-CM

## 2013-11-19 MED ORDER — FLUOXETINE HCL 40 MG PO CAPS: ORAL_CAPSULE | ORAL | Status: DC

## 2013-11-19 NOTE — Progress Notes (Signed)
   Subjective:    Patient ID: Shirley Moody, female    DOB: 1960-09-22, 53 y.o.   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 she has feelings of being overwhelmed, unable to get her work done, feeling wiped out and only feels irritated at work stated that she had the nurse at the school check her bp yesterday and it was elevated .    Thinks the stress of the new job is getting to her.  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 She is currenlty on fluoxetine and wellbtrin. Sleep is ok.  Starting to get a scartchey throat.  No recent URI.  Felt nauseated ysterday AM.  Not eaten anything unusual.  Didn't really eat much yesterday.  She has an appointment with her psychiatrist in about 3 weeks.she feels she is a perfectionist to a degree and puts a lot of pressure on herself to perform well and do well.  Review of Systems     Objective:   Physical Exam  Constitutional: She is oriented to person, place, and time. She appears well-developed and well-nourished.  HENT:  Head: Normocephalic and atraumatic.  Cardiovascular: Normal rate, regular rhythm and normal heart sounds.   Pulmonary/Chest: Effort normal and breath sounds normal.  Neurological: She is alert and oriented to person, place, and time.  Skin: Skin is warm and dry.  Psychiatric: She has a normal mood and affect. Her behavior is normal.          Assessment & Plan:  Anxiety-uncontrolled. Gad 7 score of 12 today. She rates her symptoms as extremely difficult.  discussed different options. I think it may be worth trying to get up on the fluoxetine to 40 mg for the next 3 weeks into she is able to get in with her psychiatrist. That way she can see if it's helping her and they can make adjustments at that time. Continue Wellbutrin. She had tried a higher dose of Wellbutrin  in the past but did not feel well on it. Also discussed working on ways to reduce her stress and asking for help. It does sound like she has a very supportive principal at her school. She may not be able to help with the big issues but may be able to help with small things that could be timesavers etc. Which might help reduce her stress.

## 2013-11-26 ENCOUNTER — Encounter: Payer: Self-pay | Admitting: *Deleted

## 2013-11-26 ENCOUNTER — Emergency Department
Admission: EM | Admit: 2013-11-26 | Discharge: 2013-11-26 | Disposition: A | Discharge status: Home / Self Care | Payer: Managed Care, Other (non HMO) | Source: Home / Self Care | Attending: Emergency Medicine | Admitting: Emergency Medicine

## 2013-11-26 DIAGNOSIS — J209 Acute bronchitis, unspecified: Secondary | ICD-10-CM

## 2013-11-26 MED ORDER — AZITHROMYCIN 250 MG PO TABS: 250.0000 mg | ORAL_TABLET | Freq: Every day | ORAL | Status: DC

## 2013-11-26 NOTE — Discharge Instructions (Signed)

## 2013-11-26 NOTE — ED Provider Notes (Signed)
CSN: [id number]     Arrival date & time 11/26/13  1342 History   First MD Initiated Contact with Patient 11/26/13 1359     Chief Complaint  Patient presents with  . Cough   (Consider location/radiation/quality/duration/timing/severity/associated sxs/prior Treatment) Patient is a 53 y.o. female presenting with cough. The history is provided by the patient. No language interpreter was used.  Cough Cough characteristics:  Productive Sputum characteristics:  Green and nondescript Severity:  Moderate Onset quality:  Gradual Timing:  Constant Progression:  Worsening Chronicity:  New Context: upper respiratory infection   Relieved by:  Nothing Worsened by:  Nothing tried Ineffective treatments:  None tried Associated symptoms: shortness of breath     Past Medical History  Diagnosis Date  . Depression   . Allergy   . Diverticulitis   . IBS (irritable bowel syndrome)   . Colon polyp    Past Surgical History  Procedure Laterality Date  . Neuroma surgery      Right foot  . Dilation and curettage of uterus     Family History  Problem Relation Age of Onset  . Alcohol abuse Mother   . COPD Mother   . Hypertension Father   . Other Father 67    AMI/ CVA at 44  . Stroke Father   . Colon cancer Father    History  Substance Use Topics  . Smoking status: Former Research scientist (life sciences)  . Smokeless tobacco: Not on file  . Alcohol Use: Yes     Comment: 1 drink/month   OB History    Gravida Para Term Preterm AB TAB SAB Ectopic Multiple Living   1 0   1          Review of Systems  Respiratory: Positive for cough and shortness of breath.   All other systems reviewed and are negative.   Allergies  Biaxin; Concerta; and Penicillins  Home Medications   Prior to Admission medications   Medication Sig Start Date End Date Taking? Authorizing Provider  buPROPion (WELLBUTRIN XL) 150 MG 24 hr tablet Take 450 mg by mouth daily.    Yes Historical Provider, MD  cetirizine (ZYRTEC) 10 MG tablet  Take 10 mg by mouth daily.     Yes Historical Provider, MD  FLUoxetine (PROZAC) 40 MG capsule TAKE 1 CAPSULE BY MOUTH EVERY DAY 11/19/13  Yes Hali Marry, MD  traZODone (DESYREL) 150 MG tablet TAKE 1 TABLET BY MOUTH EVERY NIGHT AT BEDTIME 11/07/12  Yes Hali Marry, MD  azithromycin (ZITHROMAX) 250 MG tablet Take 1 tablet (250 mg total) by mouth daily. Take first 2 tablets together, then 1 every day until finished. 11/26/13   Fransico Meadow, PA-C  naproxen (NAPROSYN) 500 MG tablet Take 1 tablet (500 mg total) by mouth 2 (two) times daily with a meal. 03/11/13   Silverio Decamp, MD  NASONEX 50 MCG/ACT nasal spray INSTILL 1 SPRAY IN EACH NOSTRIL EVERY DAY 07/29/13   Hali Marry, MD   BP 120/84 mmHg  Pulse 66  Temp(Src) 98 F (36.7 C) (Oral)  Resp 16  Ht 5\' 4"  (1.626 m)  Wt 126 lb (57.153 kg)  BMI 21.62 kg/m2  SpO2 98%  LMP 04/05/2011 Physical Exam  Constitutional: She appears well-developed and well-nourished.  HENT:  Head: Normocephalic.  Right Ear: External ear normal.  Left Ear: External ear normal.  Eyes: Conjunctivae are normal. Pupils are equal, round, and reactive to light.  Neck: Normal range of motion.  Cardiovascular: Normal rate and  normal heart sounds.   Pulmonary/Chest: Effort normal and breath sounds normal.  Abdominal: Soft.  Musculoskeletal: Normal range of motion.  Neurological: She is alert.  Skin: Skin is warm.  Psychiatric: She has a normal mood and affect.  Nursing note and vitals reviewed.   ED Course  Procedures (including critical care time) Labs Review Labs Reviewed - No data to display  Imaging Review No results found.   MDM   1. Acute bronchitis, unspecified organism    zithromax     Fransico Meadow, [place] 11/26/13 1451

## 2013-11-26 NOTE — ED Notes (Signed)
Pt c/o productive cough and chest congestion x 1 wk. Denies fever. She has taken Mucinex.

## 2013-12-10 ENCOUNTER — Telehealth: Payer: Self-pay

## 2013-12-10 ENCOUNTER — Other Ambulatory Visit: Payer: Self-pay | Admitting: Family Medicine

## 2013-12-10 MED ORDER — DOXYCYCLINE HYCLATE 100 MG PO TABS: 100.0000 mg | ORAL_TABLET | Freq: Two times a day (BID) | ORAL | Status: DC

## 2013-12-10 NOTE — Telephone Encounter (Signed)
Shirley Moody states she still has a productive cough and a runny nose. She reports that she usually has to take two rounds of Zpacks before she feels better. Denies fever, chills or sweats.

## 2013-12-10 NOTE — Telephone Encounter (Signed)
Patient advised and prescription sent.

## 2013-12-10 NOTE — Telephone Encounter (Signed)
Call pt: I don't like to send over same rx twice. Ok to send over doxycycline 100mg  bid for 10 days. Delsym for cough

## 2014-01-05 ENCOUNTER — Other Ambulatory Visit: Payer: Self-pay | Admitting: Obstetrics & Gynecology

## 2014-01-05 DIAGNOSIS — Z139 Encounter for screening, unspecified: Secondary | ICD-10-CM

## 2014-01-15 ENCOUNTER — Ambulatory Visit (INDEPENDENT_AMBULATORY_CARE_PROVIDER_SITE_OTHER): Payer: Managed Care, Other (non HMO)

## 2014-01-15 DIAGNOSIS — Z1231 Encounter for screening mammogram for malignant neoplasm of breast: Secondary | ICD-10-CM

## 2014-01-15 DIAGNOSIS — Z139 Encounter for screening, unspecified: Secondary | ICD-10-CM

## 2014-03-28 ENCOUNTER — Emergency Department
Admission: EM | Admit: 2014-03-28 | Discharge: 2014-03-28 | Disposition: A | Discharge status: Home / Self Care | Payer: BC Managed Care – PPO | Source: Home / Self Care | Attending: Family Medicine | Admitting: Family Medicine

## 2014-03-28 DIAGNOSIS — R110 Nausea: Secondary | ICD-10-CM | POA: Diagnosis not present

## 2014-03-28 DIAGNOSIS — R1013 Epigastric pain: Secondary | ICD-10-CM | POA: Diagnosis not present

## 2014-03-28 LAB — POCT CBC W AUTO DIFF (K'VILLE URGENT CARE)

## 2014-03-28 LAB — POCT URINALYSIS DIPSTICK
Bilirubin, UA: NEGATIVE
Glucose, UA: NEGATIVE
Ketones, UA: NEGATIVE
Nitrite, UA: NEGATIVE
Protein, UA: NEGATIVE
RBC UA: NEGATIVE
Spec Grav, UA: 1.015
Urobilinogen, UA: 0.2
pH, UA: 7

## 2014-03-28 MED ORDER — OMEPRAZOLE 40 MG PO CPDR: 40.0000 mg | DELAYED_RELEASE_CAPSULE | Freq: Every day | ORAL | Status: DC

## 2014-03-28 MED ORDER — ONDANSETRON HCL 8 MG PO TABS
8.0000 mg | ORAL_TABLET | Freq: Once | ORAL | Status: AC
  Administered 2014-03-28: 8 mg via ORAL

## 2014-03-28 MED ORDER — ONDANSETRON 8 MG PO TBDP: ORAL_TABLET | ORAL | Status: DC

## 2014-03-28 NOTE — ED Provider Notes (Signed)
CSN: [id number]     Arrival date & time 03/28/14  1223 History   First MD Initiated Contact with Shirley Moody 03/28/14 1346     Chief Complaint  Shirley Moody presents with  . Nausea      HPI Comments: Shirley Moody complains of one week history of post prandial nausea, and has now developed epigastric pain post prandial.  She has nausea even after drinking water.  She also now awakens each morning with nausea.  She has had two episodes of vomiting.  Her bowel movements have been normal.  She reports that she has been fatigued and has had a daily headache. She states that she had a colonoscopy about 18 months ago that revealed a polyp.  Shirley Moody's last menstrual period was 04/05/2011.  Family history of diverticulitis in her father.  Shirley Moody is a 54 y.o. female presenting with abdominal pain. The history is provided by the Shirley Moody.  Abdominal Pain Pain location:  Epigastric Pain quality: aching   Pain radiates to:  Does not radiate Pain severity:  Mild Onset quality:  Gradual Duration:  1 week Timing:  Intermittent Progression:  Worsening Chronicity:  New Context: eating   Relieved by:  Nothing Worsened by:  Eating Ineffective treatments:  None tried Associated symptoms: fatigue and nausea   Associated symptoms: no anorexia, no belching, no chest pain, no chills, no constipation, no cough, no diarrhea, no dysuria, no fever, no flatus, no hematemesis, no hematochezia, no hematuria, no melena, no shortness of breath, no sore throat, no vaginal bleeding and no vomiting     Past Medical History  Diagnosis Date  . Depression   . Allergy   . Diverticulitis   . IBS (irritable bowel syndrome)   . Colon polyp    Past Surgical History  Procedure Laterality Date  . Neuroma surgery      Right foot  . Dilation and curettage of uterus     Family History  Problem Relation Age of Onset  . Alcohol abuse Mother   . COPD Mother   . Hypertension Father   . Other Father 18    AMI/ CVA at 84  . Stroke  Father   . Colon cancer Father    History  Substance Use Topics  . Smoking status: Former Research scientist (life sciences)  . Smokeless tobacco: Not on file  . Alcohol Use: Yes     Comment: 1 drink/month   OB History    Gravida Para Term Preterm AB TAB SAB Ectopic Multiple Living   1 0   1          Review of Systems  Constitutional: Positive for fatigue. Negative for fever and chills.  HENT: Negative for sore throat.   Respiratory: Negative for cough and shortness of breath.   Cardiovascular: Negative for chest pain.  Gastrointestinal: Positive for nausea and abdominal pain. Negative for vomiting, diarrhea, constipation, melena, hematochezia, anorexia, flatus and hematemesis.  Genitourinary: Negative for dysuria, hematuria and vaginal bleeding.  All other systems reviewed and are negative.   Allergies  Biaxin; Concerta; and Penicillins  Home Medications   Prior to Admission medications   Medication Sig Start Date End Date Taking? Authorizing Provider  lisdexamfetamine (VYVANSE) 40 MG capsule Take 40 mg by mouth every morning.   Yes Historical Provider, MD  buPROPion (WELLBUTRIN XL) 150 MG 24 hr tablet Take 450 mg by mouth daily.     Historical Provider, MD  cetirizine (ZYRTEC) 10 MG tablet Take 10 mg by mouth daily.  Historical Provider, MD  FLUoxetine (PROZAC) 40 MG capsule TAKE 1 CAPSULE BY MOUTH EVERY DAY 11/19/13   Hali Marry, MD  naproxen (NAPROSYN) 500 MG tablet Take 1 tablet (500 mg total) by mouth 2 (two) times daily with a meal. 03/11/13   Silverio Decamp, MD  NASONEX 50 MCG/ACT nasal spray INSTILL 1 SPRAY IN EACH NOSTRIL EVERY DAY 07/29/13   Hali Marry, MD  omeprazole (PRILOSEC) 40 MG capsule Take 1 capsule (40 mg total) by mouth daily. Take 30 minutes before breakfast 03/28/14   Kandra Nicolas, MD  ondansetron (ZOFRAN-ODT) 8 MG disintegrating tablet Take one tab PO Q6 to 8hr prn nausea.  Dissolve under tongue 03/28/14   Kandra Nicolas, MD  traZODone (DESYREL) 150  MG tablet TAKE 1 TABLET BY MOUTH EVERY NIGHT AT BEDTIME 11/07/12   Hali Marry, MD   BP 135/81 mmHg  Pulse 72  Temp(Src) 98 F (36.7 C) (Oral)  Wt 123 lb (55.792 kg)  SpO2 98%  LMP 04/05/2011 Physical Exam  Abdominal:     Nursing notes and Vital Signs reviewed. Appearance:  Shirley Moody appears stated age, and in no acute distress Eyes:  Pupils are equal, round, and reactive to light and accomodation.  Extraocular movement is intact.  Conjunctivae are not inflamed  Ears:  Canals normal.  Tympanic membranes normal.  Nose:  Normal turbinates.  No sinus tenderness.   Pharynx:  Normal Neck:  Supple.  Tender shotty posterior nodes are palpated bilaterally.  Left supraclavicular node (Virchow's node) tender to palpation  Lungs:  Clear to auscultation.  Breath sounds are equal.  Heart:  Regular rate and rhythm without murmurs, rubs, or gallops.  Abdomen:  Tenderness in the peri-umbilical mid-abdomen and right upper quadrant, as noted on diagram, without masses or hepatosplenomegaly.  Bowel sounds are present.  No CVA or flank tenderness.  Extremities:  No edema.  No calf tenderness Skin:  No rash present.   ED Course  Procedures  none   Labs Reviewed  POCT URINALYSIS DIPSTICK - Abnormal; Notable for the following:  LEU trace; otherwise negative  COMPLETE METABOLIC PANEL WITH GFR  AMYLASE  LIPASE  POCT CBC W AUTO DIFF ([facility]):  WBC 3.6; LY 30.7; MO 7.3; GR 62.0; Hgb 13.6; Platelets 217         MDM   1. Abdominal pain, epigastric; ?GERD; ?gastric ulcer   2. Nausea without vomiting    CMP, amylase, lipase pending Rx for Zofran ODT 8mg  PO. Begin trial omeprazole 40mg  QAM May need CT abdomen. Followup with Family Doctor in about 5 days.    Kandra Nicolas, MD 03/31/14 [phone]

## 2014-03-28 NOTE — ED Notes (Signed)
Patient states she has some nausea , stomach pain and fatigue. She states that she did have a headache every day this week although she does not have one today. Pain scale 3 is for her stomach ache. She states symptoms started last Sunday after eating brunch at a restaurant.

## 2014-03-28 NOTE — Discharge Instructions (Signed)

## 2014-03-29 LAB — COMPLETE METABOLIC PANEL WITH GFR
ALBUMIN: 4 g/dL (ref 3.5–5.2)
ALK PHOS: 46 U/L (ref 39–117)
ALT: 15 U/L (ref 0–35)
AST: 17 U/L (ref 0–37)
BILIRUBIN TOTAL: 0.3 mg/dL (ref 0.2–1.2)
BUN: 10 mg/dL (ref 6–23)
CALCIUM: 9.1 mg/dL (ref 8.4–10.5)
CO2: 30 mEq/L (ref 19–32)
Chloride: 101 mEq/L (ref 96–112)
Creat: 0.81 mg/dL (ref 0.50–1.10)
GFR, EST NON AFRICAN AMERICAN: 84 mL/min
Glucose, Bld: 84 mg/dL (ref 70–99)
Potassium: 3.8 mEq/L (ref 3.5–5.3)
Sodium: 138 mEq/L (ref 135–145)
Total Protein: 6 g/dL (ref 6.0–8.3)

## 2014-03-29 LAB — LIPASE: Lipase: 19 U/L (ref 0–75)

## 2014-03-29 LAB — AMYLASE: Amylase: 37 U/L (ref 0–105)

## 2014-06-08 ENCOUNTER — Encounter: Payer: Self-pay | Admitting: *Deleted

## 2014-06-08 ENCOUNTER — Emergency Department
Admission: EM | Admit: 2014-06-08 | Discharge: 2014-06-08 | Disposition: A | Discharge status: Home / Self Care | Payer: BC Managed Care – PPO | Source: Home / Self Care | Attending: Family Medicine | Admitting: Family Medicine

## 2014-06-08 DIAGNOSIS — R358 Other polyuria: Secondary | ICD-10-CM

## 2014-06-08 DIAGNOSIS — R3589 Other polyuria: Secondary | ICD-10-CM

## 2014-06-08 LAB — POCT URINALYSIS DIP (MANUAL ENTRY)
Bilirubin, UA: NEGATIVE
GLUCOSE UA: NEGATIVE
Ketones, POC UA: NEGATIVE
NITRITE UA: NEGATIVE
Protein Ur, POC: >=300
Spec Grav, UA: 1.02 (ref 1.005–1.03)
Urobilinogen, UA: 0.2 (ref 0–1)
pH, UA: 6.5 (ref 5–8)

## 2014-06-08 MED ORDER — CEPHALEXIN 500 MG PO CAPS: 500.0000 mg | ORAL_CAPSULE | Freq: Two times a day (BID) | ORAL | Status: DC

## 2014-06-08 MED ORDER — PHENAZOPYRIDINE HCL 200 MG PO TABS: 200.0000 mg | ORAL_TABLET | Freq: Three times a day (TID) | ORAL | Status: DC

## 2014-06-08 NOTE — ED Notes (Signed)
Pt c/o polyuria and hematuria x today. Afebrile, denies flank pain , has chronic low back pain

## 2014-06-08 NOTE — Discharge Instructions (Signed)
Increase fluid intake. If symptoms become significantly worse during the night or over the weekend, proceed to the local emergency room.    Urinary Frequency The number of times a normal person urinates depends upon how much liquid they take in and how much liquid they are losing. If the temperature is hot and there is high humidity, then the person will sweat more and usually breathe a little more frequently. These factors decrease the amount of frequency of urination that would be considered normal. The amount you drink is easily determined, but the amount of fluid lost is sometimes more difficult to calculate.  Fluid is lost in two ways:  Sensible fluid loss is usually measured by the amount of urine that you get rid of. Losses of fluid can also occur with diarrhea.  Insensible fluid loss is more difficult to measure. It is caused by evaporation. Insensible loss of fluid occurs through breathing and sweating. It usually ranges from a little less than a quart to a little more than a quart of fluid a day. In normal temperatures and activity levels, the average person may urinate 4 to 7 times in a 24-hour period. Needing to urinate more often than that could indicate a problem. If one urinates 4 to 7 times in 24 hours and has large volumes each time, that could indicate a different problem from one who urinates 4 to 7 times a day and has small volumes. The time of urinating is also important. Most urinating should be done during the waking hours. Getting up at night to urinate frequently can indicate some problems. CAUSES  The bladder is the organ in your lower abdomen that holds urine. Like a balloon, it swells some as it fills up. Your nerves sense this and tell you it is time to head for the bathroom. There are a number of reasons that you might feel the need to urinate more often than usual. They include:  Urinary tract infection. This is usually associated with other signs such as burning when  you urinate.  In men, problems with the prostate (a walnut-size gland that is located near the tube that carries urine out of your body). There are two reasons why the prostate can cause an increased frequency of urination:  An enlarged prostate that does not let the bladder empty well. If the bladder only half empties when you urinate, then it only has half the capacity to fill before you have to urinate again.  The nerves in the bladder become more hypersensitive with an increased size of the prostate even if the bladder empties completely.  Pregnancy.  Obesity. Excess weight is more likely to cause a problem for women than for men.  Bladder stones or other bladder problems.  Caffeine.  Alcohol.  Medications. For example, drugs that help the body get rid of extra fluid (diuretics) increase urine production. Some other medicines must be taken with lots of fluids.  Muscle or nerve weakness. This might be the result of a spinal cord injury, a stroke, multiple sclerosis, or Parkinson disease.  Long-standing diabetes can decrease the sensation of the bladder. This loss of sensation makes it harder to sense the bladder needs to be emptied. Over a period of years, the bladder is stretched out by constant overfilling. This weakens the bladder muscles so that the bladder does not empty well and has less capacity to fill with new urine.  Interstitial cystitis (also called painful bladder syndrome). This condition develops because the tissues that  line the inside of the bladder are inflamed (inflammation is the body's way of reacting to injury or infection). It causes pain and frequent urination. It occurs in women more often than in men. DIAGNOSIS   To decide what might be causing your urinary frequency, your health care provider will probably:  Ask about symptoms you have noticed.  Ask about your overall health. This will include questions about any medications you are taking.  Do a  physical examination.  Order some tests. These might include:  A blood test to check for diabetes or other health issues that could be contributing to the problem.  Urine testing. This could measure the flow of urine and the pressure on the bladder.  A test of your neurological system (the brain, spinal cord, and nerves). This is the system that senses the need to urinate.  A bladder test to check whether it is emptying completely when you urinate.  Cystoscopy. This test uses a thin tube with a tiny camera on it. It offers a look inside your urethra and bladder to see if there are problems.  Imaging tests. You might be given a contrast dye and then asked to urinate. X-rays are taken to see how your bladder is working. TREATMENT  It is important for you to be evaluated to determine if the amount or frequency that you have is unusual or abnormal. If it is found to be abnormal, the cause should be determined and this can usually be found out easily. Depending upon the cause, treatment could include medication, stimulation of the nerves, or surgery. There are not too many things that you can do as an individual to change your urinary frequency. It is important that you balance the amount of fluid intake needed to compensate for your activity and the temperature. Medical problems will be diagnosed and taken care of by your physician. There is no particular bladder training such as Kegel exercises that you can do to help urinary frequency. This is an exercise that is usually recommended for people who have leaking of urine when they laugh, cough, or sneeze. HOME CARE INSTRUCTIONS   Take any medications your health care provider prescribed or suggested. Follow the directions carefully.  Practice any lifestyle changes that are recommended. These might include:  Drinking less fluid or drinking at different times of the day. If you need to urinate often during the night, for example, you may need to  stop drinking fluids early in the evening.  Cutting down on caffeine or alcohol. They both can make you need to urinate more often than normal. Caffeine is found in coffee, tea, and sodas.  Losing weight, if that is recommended.  Keep a journal or a log. You might be asked to record how much you drink and when and where you feel the need to urinate. This will also help evaluate how well the treatment provided by your physician is working. SEEK MEDICAL CARE IF:   Your need to urinate often gets worse.  You feel increased pain or irritation when you urinate.  You notice blood in your urine.  You have questions about any medications that your health care provider recommended.  You notice blood, pus, or swelling at the site of any test or treatment procedure.  You develop a fever of more than 100.73F (38.1C). SEEK IMMEDIATE MEDICAL CARE IF:  You develop a fever of more than 102.10F (38.9C). Document Released: 10/29/2008 Document Revised: 05/19/2013 Document Reviewed: 10/29/2008 ExitCare Patient Information 2015 [employer]. This information is not intended to replace advice given to you by your health care provider. Make sure you discuss any questions you have with your health care provider.

## 2014-06-08 NOTE — ED Provider Notes (Signed)
CSN: [id number]     Arrival date & time 06/08/14  1724 History   First MD Initiated Contact with Patient 06/08/14 1811     Chief Complaint  Patient presents with  . Hematuria  . Polyuria      HPI Comments: Patient noticed that her urine appeared orange today and she developed frequency, hematuria, and hesitancy, but minimal dysuria.  She had chills/sweats but no fever.  She feels well otherwise.  No abdominal, pelvic, or flank pain.  No nausea/vomiting.  No recent antibiotic use.  Patient's last menstrual period was 04/05/2011.   Patient is a 54 y.o. female presenting with frequency. The history is provided by the patient.  Urinary Frequency This is a new problem. The current episode started 6 to 12 hours ago. The problem occurs constantly. The problem has not changed since onset.Pertinent negatives include no abdominal pain. Nothing aggravates the symptoms. The symptoms are relieved by acetaminophen. She has tried nothing for the symptoms.    Past Medical History  Diagnosis Date  . Depression   . Allergy   . Diverticulitis   . IBS (irritable bowel syndrome)   . Colon polyp    Past Surgical History  Procedure Laterality Date  . Neuroma surgery      Right foot  . Dilation and curettage of uterus     Family History  Problem Relation Age of Onset  . Alcohol abuse Mother   . COPD Mother   . Hypertension Father   . Other Father 38    AMI/ CVA at 45  . Stroke Father   . Colon cancer Father    History  Substance Use Topics  . Smoking status: Former Research scientist (life sciences)  . Smokeless tobacco: Not on file  . Alcohol Use: Yes     Comment: 1 drink/month   OB History    Gravida Para Term Preterm AB TAB SAB Ectopic Multiple Living   1 0   1          Review of Systems  Constitutional: Negative for fever, diaphoresis and fatigue.  Gastrointestinal: Negative for abdominal pain.  Genitourinary: Positive for urgency, frequency and hematuria. Negative for dysuria, vaginal discharge and pelvic  pain.  All other systems reviewed and are negative.   Allergies  Biaxin; Concerta; and Penicillins  Home Medications   Prior to Admission medications   Medication Sig Start Date End Date Taking? Authorizing Provider  buPROPion (WELLBUTRIN XL) 150 MG 24 hr tablet Take 450 mg by mouth daily.     Historical Provider, MD  cephALEXin (KEFLEX) 500 MG capsule Take 1 capsule (500 mg total) by mouth 2 (two) times daily. 06/08/14   Kandra Nicolas, MD  cetirizine (ZYRTEC) 10 MG tablet Take 10 mg by mouth daily.      Historical Provider, MD  FLUoxetine (PROZAC) 40 MG capsule TAKE 1 CAPSULE BY MOUTH EVERY DAY 11/19/13   Hali Marry, MD  lisdexamfetamine (VYVANSE) 40 MG capsule Take 40 mg by mouth every morning.    Historical Provider, MD  naproxen (NAPROSYN) 500 MG tablet Take 1 tablet (500 mg total) by mouth 2 (two) times daily with a meal. 03/11/13   Silverio Decamp, MD  NASONEX 50 MCG/ACT nasal spray INSTILL 1 SPRAY IN EACH NOSTRIL EVERY DAY 07/29/13   Hali Marry, MD  omeprazole (PRILOSEC) 40 MG capsule Take 1 capsule (40 mg total) by mouth daily. Take 30 minutes before breakfast 03/28/14   Kandra Nicolas, MD  phenazopyridine (PYRIDIUM) 200 MG  tablet Take 1 tablet (200 mg total) by mouth 3 (three) times daily. Take after meals. 06/08/14   Kandra Nicolas, MD  traZODone (DESYREL) 150 MG tablet TAKE 1 TABLET BY MOUTH EVERY NIGHT AT BEDTIME 11/07/12   Hali Marry, MD   BP 130/88 mmHg  Pulse 81  Temp(Src) 98.3 F (36.8 C) (Oral)  Resp 14  Wt 121 lb (54.885 kg)  SpO2 100%  LMP 04/05/2011 Physical Exam Nursing notes and Vital Signs reviewed. Appearance:  Patient appears stated age, and in no acute distress Eyes:  Pupils are equal, round, and reactive to light and accomodation.  Extraocular movement is intact.  Conjunctivae are not inflamed  Ears:  Canals normal.  Tympanic membranes normal.  Nose:  Normal turbinates.  No sinus tenderness.   Pharynx:  Normal Neck:   Supple.  No adenopathy Lungs:  Clear to auscultation.  Breath sounds are equal.  Heart:  Regular rate and rhythm without murmurs, rubs, or gallops.  Abdomen:  Nontender without masses or hepatosplenomegaly.  Bowel sounds are present.  No CVA or flank tenderness.  Extremities:  No edema.   Skin:  No rash present.   ED Course  Procedures  None    Labs Reviewed  URINE CULTURE  POCT URINALYSIS DIP (MANUAL ENTRY):  BLO large; PRO >= 300mg /dL; LEU large      MDM   1. Polyuria    Urine culture pending. Begin Keflex 500mg  BID (#14).  Rx for Pyridium. Increase fluid intake. If symptoms become significantly worse during the night or over the weekend, proceed to the local emergency room.  Followup with Family Doctor if not improved in one week.     Kandra Nicolas, MD 06/09/14 [phone]

## 2014-06-11 LAB — URINE CULTURE: Colony Count: >=100000

## 2014-06-12 ENCOUNTER — Telehealth: Payer: Self-pay | Admitting: Emergency Medicine

## 2014-06-12 NOTE — ED Notes (Signed)
Inquired about patient's status; encourage them to call with questions/concerns.  

## 2014-06-26 ENCOUNTER — Ambulatory Visit (INDEPENDENT_AMBULATORY_CARE_PROVIDER_SITE_OTHER): Payer: BC Managed Care – PPO | Admitting: Sports Medicine

## 2014-06-26 ENCOUNTER — Ambulatory Visit (INDEPENDENT_AMBULATORY_CARE_PROVIDER_SITE_OTHER): Payer: BC Managed Care – PPO

## 2014-06-26 ENCOUNTER — Encounter: Payer: Self-pay | Admitting: Sports Medicine

## 2014-06-26 ENCOUNTER — Other Ambulatory Visit: Payer: Self-pay | Admitting: Sports Medicine

## 2014-06-26 VITALS — BP 121/78 | HR 73 | Ht 64.0 in | Wt 120.0 lb

## 2014-06-26 DIAGNOSIS — M1712 Unilateral primary osteoarthritis, left knee: Secondary | ICD-10-CM | POA: Diagnosis not present

## 2014-06-26 DIAGNOSIS — M7541 Impingement syndrome of right shoulder: Secondary | ICD-10-CM

## 2014-06-26 DIAGNOSIS — M1711 Unilateral primary osteoarthritis, right knee: Secondary | ICD-10-CM

## 2014-06-26 MED ORDER — MELOXICAM 15 MG PO TABS: ORAL_TABLET | ORAL | Status: DC

## 2014-06-26 NOTE — Assessment & Plan Note (Signed)
Previous injection was 10 months ago. Repeat right subacromial injection today.  Previous injections were subacromial and glenohumeral. Formal physical therapy.

## 2014-06-26 NOTE — Assessment & Plan Note (Signed)
Injection, meloxicam, x-rays, physical therapy. Return in one month.

## 2014-06-26 NOTE — Progress Notes (Signed)
  Subjective:    CC: Left knee pain, right shoulder pain  HPI: Right shoulder pain:  localized over the deltoid, worse with overhead activities, moderate, persistent without radiation, previous injection was both subacromial and glenohumeral, pain predominantly his impingement related today, desires injection.  Left knee pain: Medial joint line, moderate, persistent without radiation, no trauma, worse in the mornings, no mechanical symptoms.  Past medical history, Surgical history, Family history not pertinant except as noted below, Social history, Allergies, and medications have been entered into the medical record, reviewed, and no changes needed.   Review of Systems: No fevers, chills, night sweats, weight loss, chest pain, or shortness of breath.   Objective:    General: Well Developed, well nourished, and in no acute distress.  Neuro: Alert and oriented x3, extra-ocular muscles intact, sensation grossly intact.  HEENT: Normocephalic, atraumatic, pupils equal round reactive to light, neck supple, no masses, no lymphadenopathy, thyroid nonpalpable.  Skin: Warm and dry, no rashes. Cardiac: Regular rate and rhythm, no murmurs rubs or gallops, no lower extremity edema.  Respiratory: Clear to auscultation bilaterally. Not using accessory muscles, speaking in full sentences. Right Shoulder: Inspection reveals no abnormalities, atrophy or asymmetry. Palpation is normal with no tenderness over AC joint or bicipital groove. ROM is full in all planes. Rotator cuff strength normal throughout. Positive Neer and Hawkin's tests, empty can. Speeds and Yergason's tests normal. No labral pathology noted with negative Obrien's, negative crank, negative clunk, and good stability. Normal scapular function observed. No painful arc and no drop arm sign. No apprehension sign Left Knee: Normal to inspection with no erythema or effusion or obvious bony abnormalities. Tender to palpation at the medial  joint line ROM normal in flexion and extension and lower leg rotation. Ligaments with solid consistent endpoints including ACL, PCL, LCL, MCL. Negative Mcmurray's and provocative meniscal tests. Non painful patellar compression. Patellar and quadriceps tendons unremarkable. Hamstring and quadriceps strength is normal.  Procedure: Real-time Ultrasound Guided Injection of left knee Device: GE Logiq E  Verbal informed consent obtained.  Time-out conducted.  Noted no overlying erythema, induration, or other signs of local infection.  Skin prepped in a sterile fashion.  Local anesthesia: Topical Ethyl chloride.  With sterile technique and under real time ultrasound guidance:  1 mL kenalog 40, 4 mL lidocaine injected easily into the suprapatellar recess. Completed without difficulty  Pain immediately resolved suggesting accurate placement of the medication.  Advised to call if fevers/chills, erythema, induration, drainage, or persistent bleeding.  Images permanently stored and available for review in the ultrasound unit.  Impression: Technically successful ultrasound guided injection.  Procedure: Real-time Ultrasound Guided Injection of right subacromial bursa Device: GE Logiq E  Verbal informed consent obtained.  Time-out conducted.  Noted no overlying erythema, induration, or other signs of local infection.  Skin prepped in a sterile fashion.  Local anesthesia: Topical Ethyl chloride.  With sterile technique and under real time ultrasound guidance:  Noted intact supraspinatus, 1 mL kenalog 40, 4 mL lidocaine injected easily into the subacromial bursa. Completed without difficulty  Pain immediately resolved suggesting accurate placement of the medication.  Advised to call if fevers/chills, erythema, induration, drainage, or persistent bleeding.  Images permanently stored and available for review in the ultrasound unit.  Impression: Technically successful ultrasound guided  injection.  Impression and Recommendations:

## 2014-06-29 ENCOUNTER — Other Ambulatory Visit: Payer: Self-pay | Admitting: Family Medicine

## 2014-07-01 ENCOUNTER — Encounter: Payer: Self-pay | Admitting: Physical Therapy

## 2014-07-01 ENCOUNTER — Ambulatory Visit (INDEPENDENT_AMBULATORY_CARE_PROVIDER_SITE_OTHER): Payer: BC Managed Care – PPO | Admitting: Physical Therapy

## 2014-07-01 DIAGNOSIS — R29898 Other symptoms and signs involving the musculoskeletal system: Secondary | ICD-10-CM

## 2014-07-01 DIAGNOSIS — G729 Myopathy, unspecified: Secondary | ICD-10-CM | POA: Diagnosis not present

## 2014-07-01 DIAGNOSIS — M6289 Other specified disorders of muscle: Secondary | ICD-10-CM

## 2014-07-01 DIAGNOSIS — M25511 Pain in right shoulder: Secondary | ICD-10-CM | POA: Diagnosis not present

## 2014-07-01 NOTE — Therapy (Signed)
[facility] [place], [place], [address] Phone: [phone]   Fax:  [phone]  Physical Therapy Evaluation  Patient Details  Name: Shirley Moody MRN: [id number] Date of Birth: April 20, 1960 Referring Provider:  Silverio Decamp,*  Encounter Date: 07/01/2014      PT End of Session - 07/01/14 0740    Visit Number 1   Number of Visits 6   Date for PT Re-Evaluation 08/12/14   PT Start Time 0704   PT Stop Time 0813   PT Time Calculation (min) 69 min   Activity Tolerance Patient tolerated treatment well      Past Medical History  Diagnosis Date  . Depression   . Allergy   . Diverticulitis   . IBS (irritable bowel syndrome)   . Colon polyp     Past Surgical History  Procedure Laterality Date  . Neuroma surgery      Right foot  . Dilation and curettage of uterus      There were no vitals filed for this visit.  Visit Diagnosis:  Pain in joint, shoulder region, right - Plan: PT plan of care cert/re-cert  Weakness of shoulder - Plan: PT plan of care cert/re-cert  Muscle tightness - Plan: PT plan of care cert/re-cert      Subjective Assessment - 07/01/14 0717    Subjective Pt reports Lt knee pain for a couple of months, the Rt shoulder on/off for a while, it became worse about 2 months.  Since the injection the knee doesn't bother her any more, the shoulder does.    Pertinent History had injection in her Lt knee and Rt shoulder, takes exercise classes a couple time a week.    Diagnostic tests x-rays of knee show OA   Patient Stated Goals perform exercise class without pain, use the Rt arm without pain. Pt not worried about the knee only feels we need to address the shoulder. Throw the ball for her dogs.    Currently in Pain? No/denies  has pain with reaching the arm out to the side, behind, placing hair in pony tail, taking a shirt off and hooking bra in the back.             [facility] PT  Assessment - 07/01/14 0001    Assessment   Medical Diagnosis Rt shoulder impingement, Lt knee OA   Onset Date/Surgical Date 05/01/14   Hand Dominance Right   Next MD Visit 07/24/14   Prior Therapy none   Precautions   Precautions None   Balance Screen   Has the patient fallen in the past 6 months No   Has the patient had a decrease in activity level because of a fear of falling?  No   Is the patient reluctant to leave their home because of a fear of falling?  No   Prior Function   Level of Independence --  independent   Vocation Full time employment   Museum/gallery curator   Leisure going on vacation tomorrow, will be gone for about 1-2 wks   Observation/Other Assessments   Focus on Therapeutic Outcomes (FOTO)  39% limited   Posture/Postural Control   Posture/Postural Control Postural limitations   Postural Limitations Rounded Shoulders;Forward head   ROM / Strength   AROM / PROM / Strength AROM;PROM;Strength   AROM   AROM Assessment Site Shoulder  cervical WNL however pain with Rt rotation in the Rt clavica   Right/Left Shoulder Right  all with pain  at end range   Right Shoulder Extension 48 Degrees   Right Shoulder Flexion 138 Degrees   Right Shoulder ABduction 140 Degrees   Right Shoulder Internal Rotation 60 Degrees   Right Shoulder External Rotation 80 Degrees   Strength   Strength Assessment Site Shoulder   Right/Left Shoulder --  Lt WNL, Rt WNL except Flexion, ER 4/5   Palpation   Palpation comment trigger point in distal Rt deltiod, and posterior Rt shoulder    Special Tests    Special Tests Rotator Cuff Impingement   Rotator Cuff Impingment tests Michel Bickers test;Neer impingement test   Neer Impingement test    Findings Positive   Side Right   Hawkins-Kennedy test   Findings Positive   Side Right                   [facility] Adult PT Treatment/Exercise - 07/01/14 0001    Exercises   Exercises Shoulder   Shoulder Exercises: Prone    Horizontal ABduction 1 Strengthening;Both;10 reps;Weights   Horizontal ABduction 1 Weight (lbs) 1   Shoulder Exercises: Standing   External Rotation Strengthening;Right;10 reps;Theraband  2 sets    Theraband Level (Shoulder External Rotation) Level 2 (Red)   Internal Rotation Strengthening;Right;10 reps;Theraband  2 sets   Theraband Level (Shoulder Internal Rotation) Level 2 (Red)   Flexion Strengthening;Right;10 reps;Theraband  2 sets (rockwood 4- punch)    Theraband Level (Shoulder Flexion) Level 2 (Red)   Row Strengthening;Right;10 reps;Theraband   Theraband Level (Shoulder Row) Level 2 (Red)   Shoulder Exercises: Stretch   Other Shoulder Stretches doorway stretch Rt shoulder, low, med, high x 20 sec each    Modalities   Modalities Ultrasound   Ultrasound   Ultrasound Location Rt shoulder/ deltoid area   Ultrasound Parameters 100%, 8 min, 1.0 w/cm2, 3.3 mHz   Ultrasound Goals Pain                PT Education - 07/01/14 0748    Education provided Yes   Education Details HEP   Person(s) Educated Patient   Methods Explanation;Handout;Demonstration   Comprehension Verbalized understanding;Returned demonstration             PT Long Term Goals - 07/01/14 0749    PT LONG TERM GOAL #1   Title I with advanced HEP ( 08/12/14)   Time 6   Period Weeks   Status New   PT LONG TERM GOAL #2   Title increase strength Rt shoulder =/> 5/5 without pain ( 08/12/14)   Time 6   Period Weeks   Status New   PT LONG TERM GOAL #3   Title demo painfree Rt shoulder ROM to allow her to doff shirt and hook bra   Time 6   Period Weeks   Status New   PT LONG TERM GOAL #4   Title improve FOTO =/< 31% limited               Plan - 07/01/14 0741    Clinical Impression Statement 54 y/o female presents with Rt shoulder impingement symptoms.  She is not worried about her knee and doesn't feel like it needs to be assessed. She has some weakness through the Rt shoudler and upper  back along with tight pecs and a trigger point in her deltioid. She will be taking vacation over the next  couple of weeks  and will be limited in availability for formal treatments .  Pt tolerated all exercises except doorway stretch  with arms at/above 90 degrees; will limit this until pain decreases.    Pt will benefit from skilled therapeutic intervention in order to improve on the following deficits Pain;Impaired UE functional use;Decreased strength;Increased muscle spasms   Rehab Potential Excellent   PT Frequency 2x / week   PT Duration 6 weeks   PT Treatment/Interventions Iontophoresis 4mg /ml Dexamethasone;Electrical Stimulation;Cryotherapy;Ultrasound;Moist Heat;Therapeutic exercise;Patient/family education;Manual techniques   PT Next Visit Plan progress ther ex, assess trigger point in shoulder   Consulted and Agree with Plan of Care Patient         Problem List Patient Active Problem List   Diagnosis Date Noted  . Primary osteoarthritis of left knee 06/26/2014  . Impingement syndrome of right shoulder 03/11/2013  . Laryngitis 12/10/2012  . 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 Pain at left costal margin. 11/15/2011  . ADHD (attention deficit hyperactivity disorder) 12/05/2010  . GENERALIZED ANXIETY DISORDER 02/16/2010  . PAP SMEAR, LGSIL, ABNORMAL 02/16/2010  . DIVERTICULITIS, HX OF 01/22/2010  . ALLERGIC RHINITIS 11/16/2009  . COLONIC POLYPS, HYPERPLASTIC 01/27/2009  . DEPRESSION 02/01/2008    Jeral Pinch, PT 07/01/2014, 8:29 AM  [facility] [place], [place], [address] Phone: [phone]   Fax:  [phone]

## 2014-07-01 NOTE — Patient Instructions (Addendum)
   Strengthening: Horizontal Abduction - with External Rotation (Prone)   Holding _1___ pound weights, raise arms out from sides, pinching shoulder blades. Keep elbows straight, thumbs up. Repeat _10___ times per set. Do __2__ sets per session. Do __1__ sessions per day.     Low Row: Single Arm   Face anchor in stride stance. Palm up, pull arm back while squeezing shoulder blades together. Repeat 10__ times per set. Repeat with other arm. Do _2-3_ sets per session. Do 3__ sessions per week. Anchor Height: Waist   Press: Thumb Up (Single Arm)   Face away from anchor in stride stance, leg forward opposite exercising arm. Press arm forward with thumb up. Repeat 10 times per set.  Do _2-3_ sets per session. Do _3_ sessions per week. Anchor Height: Chest   Rotation: External (Single Arm)   Side toward anchor in shoulder width stance with elbow bent to 90, arm across mid-section. Thumb up, pull arm away from body, keeping elbow bent. Repeat _10_ times per set. Repeat with other arm. Do _2-3_ sets per session. Do _3_ sessions per week. Anchor Height: Waist  Rotation: Internal (Single Arm)   Side toward anchor in shoulder width stance with elbow bent to 90, forearm away from body. Thumb up, pull arm across body keeping elbow bent. Repeat _10_ times per set. Repeat with other arm. Do _2-3_ sets per session. Do _3_ sessions per week. Anchor Height: Waist  Scapula Adduction With Pectorals, Low   Stand in doorframe with palms against frame and arms at 45. Lean forward and squeeze shoulder blades. Hold __30_ seconds. Repeat __2_ times per session. Do __2_ sessions per day.  Scapula Adduction With Pectorals, Mid-Range   Stand in doorframe with palms against frame and arms at 90. Lean forward and squeeze shoulder blades. Hold _30__ seconds. Repeat __1-2_ times per session. Do _2__ sessions per day.

## 2014-07-10 ENCOUNTER — Ambulatory Visit (INDEPENDENT_AMBULATORY_CARE_PROVIDER_SITE_OTHER): Payer: BC Managed Care – PPO | Admitting: Physical Therapy

## 2014-07-10 DIAGNOSIS — R29898 Other symptoms and signs involving the musculoskeletal system: Secondary | ICD-10-CM | POA: Diagnosis not present

## 2014-07-10 DIAGNOSIS — G729 Myopathy, unspecified: Secondary | ICD-10-CM

## 2014-07-10 DIAGNOSIS — M6289 Other specified disorders of muscle: Secondary | ICD-10-CM

## 2014-07-10 DIAGNOSIS — M25511 Pain in right shoulder: Secondary | ICD-10-CM

## 2014-07-10 NOTE — Therapy (Signed)
[place], [place], [address] Phone: [phone]   Fax:  [phone]  Physical Therapy Treatment  Patient Details  Name: Shirley Moody MRN: [id number] Date of Birth: July 25, 1960 Referring Provider:  Silverio Decamp,*  Encounter Date: 07/10/2014      PT End of Session - 07/10/14 0806    Visit Number 2   Number of Visits 6   Date for PT Re-Evaluation 08/12/14   PT Start Time 0806   PT Stop Time 0859   PT Time Calculation (min) 53 min   Activity Tolerance Patient limited by pain      Past Medical History  Diagnosis Date  . Depression   . Allergy   . Diverticulitis   . IBS (irritable bowel syndrome)   . Colon polyp     Past Surgical History  Procedure Laterality Date  . Neuroma surgery      Right foot  . Dilation and curettage of uterus      There were no vitals filed for this visit.  Visit Diagnosis:  Pain in joint, shoulder region, right  Weakness of shoulder  Muscle tightness      Subjective Assessment - 07/10/14 0809    Subjective Pt reports she lost her band and HEP paperwork, so she has only performed doorway stretch.  Pt went on trip, was very cautious with avoid heavy lifting and certain motions.    Currently in Pain? Yes   Pain Score 1    Pain Location Shoulder   Pain Orientation Right   Pain Descriptors / Indicators Dull   Aggravating Factors  raising arm up, WB into arm    Pain Relieving Factors moving slowly, not raising arm high            [facility] PT Assessment - 07/10/14 0001    Assessment   Medical Diagnosis Rt shoulder impingement, Lt knee OA   Onset Date/Surgical Date 05/01/14   Hand Dominance Right   Next MD Visit 07/24/14   Prior Therapy none   AROM   AROM Assessment Site Shoulder   Right/Left Shoulder Right   Right Shoulder Flexion 142 Degrees  standing, pain 112 deg to end   Right Shoulder ABduction 130 Degrees  with pain beginning at  110 deg    Right Shoulder External Rotation 47 Degrees  (90 deg abd in supine) with pain                     [facility] Adult PT Treatment/Exercise - 07/10/14 0001    Exercises   Exercises Shoulder   Shoulder Exercises: Standing   External Rotation Strengthening;Right;10 reps  3 sets   Theraband Level (Shoulder External Rotation) Level 1 (Yellow)   Internal Rotation Strengthening;Right;10 reps;Theraband  3 sets   Theraband Level (Shoulder Internal Rotation) Level 1 (Yellow)   Extension Strengthening;Both;10 reps;Theraband  2 sets    Theraband Level (Shoulder Extension) Level 1 (Yellow)   Extension Limitations Pt reported pain in Rt bicep tendon with shoulder ext at neutral with band.    Row Strengthening;Both;10 reps  2 sets    Theraband Level (Shoulder Row) Level 1 (Yellow)   Shoulder Exercises: Therapy Ball   Flexion 20 reps  (standing with 90 deg flex-ball on wall)   ABduction 20 reps  (standing with 90 deg abduction-ball on wall)   Shoulder Exercises: ROM/Strengthening   UBE (Upper Arm Bike) UBE L1: 2 min each direction    Shoulder Exercises: Stretch  Other Shoulder Stretches Doorway stretch - low position x 20 sec x 3 reps   Modalities   Modalities Vasopneumatic   Vasopneumatic   Number Minutes Vasopneumatic  15 minutes   Vasopnuematic Location  Shoulder   Vasopneumatic Pressure Medium   Vasopneumatic Temperature  3*                     PT Long Term Goals - 07/01/14 0749    PT LONG TERM GOAL #1   Title I with advanced HEP ( 08/12/14)   Time 6   Period Weeks   Status New   PT LONG TERM GOAL #2   Title increase strength Rt shoulder =/> 5/5 without pain ( 08/12/14)   Time 6   Period Weeks   Status New   PT LONG TERM GOAL #3   Title demo painfree Rt shoulder ROM to allow her to doff shirt and hook bra   Time 6   Period Weeks   Status New   PT LONG TERM GOAL #4   Title improve FOTO =/< 31% limited               Plan - 07/10/14  0914    Clinical Impression Statement Pt continues to demo decreased Rt should ROM and report pain with shoulder flexion/ abduction above 90 deg.  Pt tolerated exercises with yellow band well, however reported increased Rt shoulder pain to 4/10 after.  Pt reported reduction in symptoms after vaso at end of treatment.     Pt will benefit from skilled therapeutic intervention in order to improve on the following deficits Pain;Impaired UE functional use;Decreased strength;Increased muscle spasms   Rehab Potential Excellent   PT Frequency 2x / week   PT Duration 6 weeks   PT Next Visit Plan progress ther ex, assess trigger point in shoulder   Consulted and Agree with Plan of Care Patient        Problem List Patient Active Problem List   Diagnosis Date Noted  . Primary osteoarthritis of left knee 06/26/2014  . Impingement syndrome of right shoulder 03/11/2013  . Laryngitis 12/10/2012  . 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 Pain at left costal margin. 11/15/2011  . ADHD (attention deficit hyperactivity disorder) 12/05/2010  . GENERALIZED ANXIETY DISORDER 02/16/2010  . PAP SMEAR, LGSIL, ABNORMAL 02/16/2010  . DIVERTICULITIS, HX OF 01/22/2010  . ALLERGIC RHINITIS 11/16/2009  . COLONIC POLYPS, HYPERPLASTIC 01/27/2009  . DEPRESSION 02/01/2008    Kerin Perna, PTA 07/10/2014 1:32 PM  [facility] [place], [place], [address] Phone: [phone]   Fax:  [phone]

## 2014-07-10 NOTE — Patient Instructions (Signed)
  Strengthening: Horizontal Abduction - with External Rotation (Prone)   Holding _1___ pound weights, raise arms out from sides, pinching shoulder blades. Keep elbows straight, thumbs up. Repeat _10___ times per set. Do __2__ sets per session. Do __1__ sessions per day.    Low Row: Single Arm   Face anchor in stride stance. Palm up, pull arm back while squeezing shoulder blades together. Repeat 10__ times per set. Repeat with other arm. Do _2-3_ sets per session. Do 3__ sessions per week. Anchor Height: Waist  Press: Thumb Up (Single Arm)   Face away from anchor in stride stance, leg forward opposite exercising arm. Press arm forward with thumb up. Repeat 10 times per set. Do _2-3_ sets per session. Do _3_ sessions per week. Anchor Height: Chest  Rotation: External (Single Arm)   Side toward anchor in shoulder width stance with elbow bent to 90, arm across mid-section. Thumb up, pull arm away from body, keeping elbow bent. Repeat _10_ times per set. Repeat with other arm. Do _2-3_ sets per session. Do _3_ sessions per week. Anchor Height: Waist  Rotation: Internal (Single Arm)   Side toward anchor in shoulder width stance with elbow bent to 90, forearm away from body. Thumb up, pull arm across body keeping elbow bent. Repeat _10_ times per set. Repeat with other arm. Do _2-3_ sets per session. Do _3_ sessions per week. Anchor Height: Waist  Scapula Adduction With Pectorals, Low   Stand in doorframe with palms against frame and arms at 45. Lean forward and squeeze shoulder blades. Hold __30_ seconds. Repeat __2_ times per session. Do __2_ sessions per day.  Scapula Adduction With Pectorals, Mid-Range   Stand in doorframe with palms against frame and arms at 90. Lean forward and squeeze shoulder blades. Hold _30__ seconds. Repeat __1-2_ times per session. Do _2__ sessions per day.

## 2014-07-17 ENCOUNTER — Telehealth: Payer: Self-pay | Admitting: Family Medicine

## 2014-07-17 ENCOUNTER — Ambulatory Visit (INDEPENDENT_AMBULATORY_CARE_PROVIDER_SITE_OTHER): Payer: BC Managed Care – PPO | Admitting: Physical Therapy

## 2014-07-17 DIAGNOSIS — M25511 Pain in right shoulder: Secondary | ICD-10-CM | POA: Diagnosis not present

## 2014-07-17 DIAGNOSIS — M6289 Other specified disorders of muscle: Secondary | ICD-10-CM

## 2014-07-17 DIAGNOSIS — G729 Myopathy, unspecified: Secondary | ICD-10-CM

## 2014-07-17 DIAGNOSIS — R29898 Other symptoms and signs involving the musculoskeletal system: Secondary | ICD-10-CM | POA: Diagnosis not present

## 2014-07-17 NOTE — Therapy (Signed)
[place], [place], [address] Phone: [phone]   Fax:  [phone]  Physical Therapy Treatment  Patient Details  Name: Shirley Moody MRN: [id number] Date of Birth: [birth year] Referring Provider:  Silverio Decamp,*  Encounter Date: 07/17/2014      PT End of Session - 07/17/14 1020    Visit Number 3   Number of Visits 6   Date for PT Re-Evaluation 08/12/14   PT Start Time 1020   PT Stop Time 1115   PT Time Calculation (min) 55 min      Past Medical History  Diagnosis Date  . Depression   . Allergy   . Diverticulitis   . IBS (irritable bowel syndrome)   . Colon polyp     Past Surgical History  Procedure Laterality Date  . Neuroma surgery      Right foot  . Dilation and curettage of uterus      There were no vitals filed for this visit.  Visit Diagnosis:  Pain in joint, shoulder region, right  Weakness of shoulder  Muscle tightness      Subjective Assessment - 07/17/14 1021    Subjective Pt reports she has no pain today.  She continues to have pain with doffing shirt, performing HEP, reaching under sink, and attempts to throw dog ball. She reports improvement in reaching overhead.    Currently in Pain? No/denies            [facility] PT Assessment - 07/17/14 0001    Assessment   Medical Diagnosis Rt shoulder impingement, Lt knee OA   Onset Date/Surgical Date 05/01/14   Hand Dominance Right   Next MD Visit 07/24/14   Prior Therapy none   AROM   AROM Assessment Site Shoulder   Right/Left Shoulder Right   Right Shoulder Flexion 150 Degrees  standing   Right Shoulder ABduction 145 Degrees  with pain    Right Shoulder External Rotation 42 Degrees  (90 deg abd in supine) with pain.Improved to 50 after manual                     [facility] Adult PT Treatment/Exercise - 07/17/14 0001    Shoulder Exercises: Prone   Extension Right;12 reps;Weights    Extension Weight (lbs) 1   Horizontal ABduction 1 Strengthening;Both;20 reps;Weights   Horizontal ABduction 1 Weight (lbs) 1   Horizontal ABduction 1 Limitations tactile cues for scap retraction   Shoulder Exercises: Standing   External Rotation Strengthening;Right;Theraband;10 reps   Theraband Level (Shoulder External Rotation) Level 1 (Yellow);Level 2 (Red)  1 set 10 with each    Internal Rotation Strengthening;Right;10 reps;Theraband   Theraband Level (Shoulder Internal Rotation) Level 1 (Yellow);Level 2 (Red)  1 set 10 with each    Flexion Strengthening;Right;10 reps;Theraband   Theraband Level (Shoulder Flexion) Level 1 (Yellow)   Extension Strengthening;Right;10 reps;Theraband   Theraband Level (Shoulder Extension) Level 2 (Red)   Row Strengthening;10 reps;Theraband  2 sets   Theraband Level (Shoulder Row) Level 2 (Red)   Shoulder Exercises: ROM/Strengthening   UBE (Upper Arm Bike) UBE L1: alternating forward backward 1 min each for 5.5 min total.    Shoulder Exercises: Stretch   Internal Rotation Stretch 20 seconds x 5 reps  with towel    Modalities   Modalities Vasopneumatic   Vasopneumatic   Number Minutes Vasopneumatic  15 minutes   Vasopnuematic Location  Shoulder   Vasopneumatic Pressure Medium   Vasopneumatic Temperature  3*   Manual Therapy   Manual Therapy Soft tissue mobilization;Myofascial release;Passive ROM   Soft tissue mobilization TPR to Rt subscap with active IR.   Myofascial Release to Rt pec    Passive ROM Rt shoulder into ER.             PT Long Term Goals - 07/01/14 0749    PT LONG TERM GOAL #1   Title I with advanced HEP ( 08/12/14)   Time 6   Period Weeks   Status New   PT LONG TERM GOAL #2   Title increase strength Rt shoulder =/> 5/5 without pain ( 08/12/14)   Time 6   Period Weeks   Status New   PT LONG TERM GOAL #3   Title demo painfree Rt shoulder ROM to allow her to doff shirt and hook bra   Time 6   Period Weeks   Status New    PT LONG TERM GOAL #4   Title improve FOTO =/< 31% limited               Plan - 07/17/14 1116    Clinical Impression Statement Pt demo improvement in shoulder ROM, continues with painful arc and limited ER.  Pt able to tolerate increased resistance without increased symptoms. Pt demo improved Rt shoulder ER with manual therapy, but reported increased overall pain. This pain was reduced at end of session with use of vaso.  Pt progressing towards goals. Will benefit from continued PT intervention to max functional mobility independence.    Pt will benefit from skilled therapeutic intervention in order to improve on the following deficits Pain;Impaired UE functional use;Decreased strength;Increased muscle spasms   Rehab Potential Excellent   PT Frequency 2x / week   PT Duration 6 weeks   PT Treatment/Interventions Iontophoresis 4mg /ml Dexamethasone;Electrical Stimulation;Cryotherapy;Ultrasound;Moist Heat;Therapeutic exercise;Patient/family education;Manual techniques   PT Next Visit Plan progress ther ex, assess trigger point in shoulder   Consulted and Agree with Plan of Care Patient        Problem List Patient Active Problem List   Diagnosis Date Noted  . Primary osteoarthritis of left knee 06/26/2014  . Impingement syndrome of right shoulder 03/11/2013  . Laryngitis 12/10/2012  . 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 Pain at left costal margin. 11/15/2011  . ADHD (attention deficit hyperactivity disorder) 12/05/2010  . GENERALIZED ANXIETY DISORDER 02/16/2010  . PAP SMEAR, LGSIL, ABNORMAL 02/16/2010  . DIVERTICULITIS, HX OF 01/22/2010  . ALLERGIC RHINITIS 11/16/2009  . COLONIC POLYPS, HYPERPLASTIC 01/27/2009  . DEPRESSION 02/01/2008    Kerin Perna, PTA 07/17/2014 12:09 PM  [place], [place], [address] Phone: [phone]   Fax:  [phone]

## 2014-07-23 ENCOUNTER — Ambulatory Visit (INDEPENDENT_AMBULATORY_CARE_PROVIDER_SITE_OTHER): Payer: BC Managed Care – PPO | Admitting: Physical Therapy

## 2014-07-23 DIAGNOSIS — R29898 Other symptoms and signs involving the musculoskeletal system: Secondary | ICD-10-CM | POA: Diagnosis not present

## 2014-07-23 DIAGNOSIS — G729 Myopathy, unspecified: Secondary | ICD-10-CM

## 2014-07-23 DIAGNOSIS — M25511 Pain in right shoulder: Secondary | ICD-10-CM | POA: Diagnosis not present

## 2014-07-23 DIAGNOSIS — M6289 Other specified disorders of muscle: Secondary | ICD-10-CM

## 2014-07-23 NOTE — Therapy (Addendum)
[facility] [address] [place], [place], [address] Phone: [phone]   Fax:  [phone]  Physical Therapy Treatment  Patient Details  Name: Shirley Moody MRN: [id number] Date of Birth: 02/03/1960 Referring Provider:  Thekkekandam, Thomas J,*  Encounter Date: 07/23/2014      PT End of Session - 07/23/14 0856    Visit Number 4   Number of Visits 6   Date for PT Re-Evaluation 08/12/14   PT Start Time 0851   PT Stop Time 0929   PT Time Calculation (min) 38 min   Activity Tolerance Patient tolerated treatment well      Past Medical History  Diagnosis Date  . Depression   . Allergy   . Diverticulitis   . IBS (irritable bowel syndrome)   . Colon polyp     Past Surgical History  Procedure Laterality Date  . Neuroma surgery      Right foot  . Dilation and curettage of uterus      There were no vitals filed for this visit.  Visit Diagnosis:  Pain in joint, shoulder region, right  Weakness of shoulder  Muscle tightness      Subjective Assessment - 07/23/14 0856    Currently in Pain? Yes   Pain Score 1    Pain Location Shoulder   Pain Orientation Right   Pain Descriptors / Indicators Dull;Aching   Aggravating Factors  abducting Rt arm above 90 deg   Pain Relieving Factors not raising arm, ice             [facility] PT Assessment - 07/23/14 0001    Assessment   Medical Diagnosis Rt shoulder impingement, Lt knee OA   Onset Date/Surgical Date 05/01/14   Hand Dominance Right   Next MD Visit 07/24/14   Prior Therapy none   Observation/Other Assessments   Focus on Therapeutic Outcomes (FOTO)  37% limitation    AROM   AROM Assessment Site Shoulder   Right/Left Shoulder Right   Right Shoulder Flexion 152 Degrees  standing   Right Shoulder ABduction 142 Degrees  standing, with pain above 90 deg   Right Shoulder Internal Rotation 60 Degrees   Right Shoulder External Rotation 47 Degrees  supine, arm  abducted to 90 deg. with pain.    Strength   Strength Assessment Site Shoulder   Right/Left Shoulder Right   Right Shoulder Flexion 4-/5  with pain   Right Shoulder Extension 5/5   Right Shoulder ABduction 4-/5   Right Shoulder Internal Rotation --  5-/5   Right Shoulder External Rotation 4+/5  with pain                      [facility] Adult PT Treatment/Exercise - 07/23/14 0001    Shoulder Exercises: Seated   External Rotation Strengthening;Both;10 reps;Theraband   Theraband Level (Shoulder External Rotation) Level 2 (Red)   Shoulder Exercises: Standing   Flexion Strengthening;Right;10 reps;Weights  2 sets   Shoulder Flexion Weight (lbs) 2#, 3#   ABduction Strengthening;Right;10 reps;Weights  2 sets    Shoulder ABduction Weight (lbs) 1#, 2#   Extension Strengthening;Both;10 reps;Theraband  1 sets with each color   Theraband Level (Shoulder Extension) Level 2 (Red);Level 3 (Green)   Row Strengthening;Both;10 reps;Theraband  1 set with each color   Theraband Level (Shoulder Row) Level 2 (Red);Level 3 (Green)   Other Standing Exercises Rings over small arch (shoulder flex up to 110) 11rings Rt to/from Lt.  Repeated with   medium arch (flexion up to 135- more challenging).   Shoulder Exercises: ROM/Strengthening   UBE (Upper Arm Bike) UBE L3: alternating forward backward 1 min each for 4.5 min   Shoulder Exercises: Stretch   Table Stretch - External Rotation 20 seconds;3 reps  demonstration required    Modalities   Modalities --  Pt declined due to time constraints. Will ice at home.                 PT Education - 07/23/14 0931    Education provided Yes   Education Details HEP -added table ER stretch    Methods Handout;Demonstration;Explanation   Comprehension Verbalized understanding;Returned demonstration             PT Long Term Goals - 07/01/14 0749    PT LONG TERM GOAL #1   Title I with advanced HEP ( 08/12/14)   Time 6   Period Weeks    Status New   PT LONG TERM GOAL #2   Title increase strength Rt shoulder =/> 5/5 without pain ( 08/12/14)   Time 6   Period Weeks   Status New   PT LONG TERM GOAL #3   Title demo painfree Rt shoulder ROM to allow her to doff shirt and hook bra   Time 6   Period Weeks   Status New   PT LONG TERM GOAL #4   Title improve FOTO =/< 31% limited               Plan - 07/23/14 0913    Clinical Impression Statement Pt demo improved Rt shoulder flexion since beginning therapy; ER and abduction remain limited and painful. Pt able to passively achieve up to ~80 deg Rt shoulder ER with table stretch. Pt continues with weakness in Rt shoulder flex, ABD, and ER.   Pt tolerated today with minimal increase in pain.   Pt will benefit from continued PT intervention to max functional mobility independence.    Pt will benefit from skilled therapeutic intervention in order to improve on the following deficits Pain;Impaired UE functional use;Decreased strength;Increased muscle spasms   Rehab Potential Excellent   PT Frequency 2x / week   PT Duration 6 weeks   PT Treatment/Interventions Iontophoresis 4mg/ml Dexamethasone;Electrical Stimulation;Cryotherapy;Ultrasound;Moist Heat;Therapeutic exercise;Patient/family education;Manual techniques   PT Next Visit Plan Continue progressive strengthening and ROM for Rt shoulder.    Consulted and Agree with Plan of Care Patient        Problem List Patient Active Problem List   Diagnosis Date Noted  . Primary osteoarthritis of left knee 06/26/2014  . Impingement syndrome of right shoulder 03/11/2013  . Laryngitis 12/10/2012  . 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 Pain at left costal margin. 11/15/2011  . ADHD (attention deficit hyperactivity disorder) 12/05/2010  . GENERALIZED ANXIETY DISORDER 02/16/2010  . PAP SMEAR, LGSIL, ABNORMAL 02/16/2010  . DIVERTICULITIS, HX OF 01/22/2010  . ALLERGIC RHINITIS 11/16/2009  . COLONIC POLYPS, HYPERPLASTIC 01/27/2009  .  DEPRESSION 02/01/2008  Jennifer Carlson-Long, PTA 07/23/2014 11:25 AM  [facility] [address] [place], [place], [address] Phone: [phone]   Fax:  [phone]     PHYSICAL THERAPY DISCHARGE SUMMARY  Visits from Start of Care: 4 Current functional level related to goals / functional outcomes: unknown   Remaining deficits: unknown   Education / Equipment: Initial HEP Plan:                                                      Patient goals were not met. Patient is being discharged due to not returning since the last visit.  ?????    Susan Shaver, PT 08/19/2014 10:24 AM  

## 2014-07-23 NOTE — Patient Instructions (Signed)
External Rotation (Passive)   With elbow bent and forearm on table, palm down, bend forward at waist until a stretch is felt. Hold __20-30__ seconds. Repeat __5__ times. Do __1-2__ sessions per day.

## 2014-07-24 ENCOUNTER — Telehealth: Payer: Self-pay | Admitting: *Deleted

## 2014-07-24 ENCOUNTER — Ambulatory Visit (INDEPENDENT_AMBULATORY_CARE_PROVIDER_SITE_OTHER): Payer: BC Managed Care – PPO | Admitting: Sports Medicine

## 2014-07-24 ENCOUNTER — Encounter: Payer: BC Managed Care – PPO | Admitting: Physical Therapy

## 2014-07-24 VITALS — BP 112/71 | HR 69 | Ht 64.0 in | Wt 119.0 lb

## 2014-07-24 DIAGNOSIS — M1712 Unilateral primary osteoarthritis, left knee: Secondary | ICD-10-CM

## 2014-07-24 DIAGNOSIS — M7541 Impingement syndrome of right shoulder: Secondary | ICD-10-CM

## 2014-07-24 MED ORDER — TRAMADOL HCL 50 MG PO TABS: ORAL_TABLET | ORAL | Status: DC

## 2014-07-24 NOTE — Telephone Encounter (Signed)
Done

## 2014-07-24 NOTE — Telephone Encounter (Signed)
Tramadol rx'ed, please fax.

## 2014-07-24 NOTE — Progress Notes (Signed)
  Subjective:    CC: Follow-up  HPI: Left knee osteoarthritis: Resolved after injection and physical therapy.  Right shoulder pain: previous injection 11 months ago was both subacromial and glenohumeral, we only did a subacromial injection at the last visit, she is still hurting at the joint line. Pain is moderate, persistent without radiation.  Past medical history, Surgical history, Family history not pertinant except as noted below, Social history, Allergies, and medications have been entered into the medical record, reviewed, and no changes needed.   Review of Systems: No fevers, chills, night sweats, weight loss, chest pain, or shortness of breath.   Objective:    General: Well Developed, well nourished, and in no acute distress.  Neuro: Alert and oriented x3, extra-ocular muscles intact, sensation grossly intact.  HEENT: Normocephalic, atraumatic, pupils equal round reactive to light, neck supple, no masses, no lymphadenopathy, thyroid nonpalpable.  Skin: Warm and dry, no rashes. Cardiac: Regular rate and rhythm, no murmurs rubs or gallops, no lower extremity edema.  Respiratory: Clear to auscultation bilaterally. Not using accessory muscles, speaking in full sentences. Right shoulder: Positive Neer sign, Hawkin sign, positive crank test.  Procedure: Real-time Ultrasound Guided Injection of right glenohumeral joint Device: GE Logiq E  Verbal informed consent obtained.  Time-out conducted.  Noted no overlying erythema, induration, or other signs of local infection.  Skin prepped in a sterile fashion.  Local anesthesia: Topical Ethyl chloride.  With sterile technique and under real time ultrasound guidance:  Spinal needle advanced into the humeral joint, 1 mL every 140, 4 mL lidocaine injected easily. Completed without difficulty  Pain immediately resolved suggesting accurate placement of the medication.  Advised to call if fevers/chills, erythema, induration, drainage, or  persistent bleeding.  Images permanently stored and available for review in the ultrasound unit.  Impression: Technically successful ultrasound guided injection.  Impression and Recommendations:

## 2014-07-24 NOTE — Telephone Encounter (Signed)
Pt called today stating that after her shoulder injection this morning she is now in a lot of pain.  I assured her that this is common and should ease up gradually over the next couple days.  I asked her if she would like something sent in for pain.  She stated that she had just taken 3 aleve and wanted to see how that did first but after thinking for a min, she decided to go ahead and have you send her something in case she needs it since it's Friday afternoon.  I informed her that it may not get taken care of until after clinic hours and for her to check with her pharm this evening.  [facility] [address] [place].

## 2014-07-24 NOTE — Assessment & Plan Note (Signed)
Insufficient response with subacromial only injection, rotator cuff dysfunction is likely at the articular surface, glenohumeral injection today.

## 2014-07-24 NOTE — Assessment & Plan Note (Signed)
Completely resolved after injection 

## 2014-08-03 ENCOUNTER — Ambulatory Visit (INDEPENDENT_AMBULATORY_CARE_PROVIDER_SITE_OTHER): Payer: BC Managed Care – PPO | Admitting: Family Medicine

## 2014-08-03 ENCOUNTER — Encounter: Payer: Self-pay | Admitting: Family Medicine

## 2014-08-03 VITALS — BP 119/68 | HR 67 | Ht 64.0 in | Wt 119.0 lb

## 2014-08-03 DIAGNOSIS — R0602 Shortness of breath: Secondary | ICD-10-CM | POA: Diagnosis not present

## 2014-08-03 DIAGNOSIS — R634 Abnormal weight loss: Secondary | ICD-10-CM | POA: Diagnosis not present

## 2014-08-03 NOTE — Progress Notes (Signed)
Subjective:    Patient ID: Shirley Moody, female    DOB: Feb 01, 1960, 54 y.o.   MRN: [id number]  HPI C/O a sensation of SOB. Says feels  liek when you yawn and can't get full breath  In.  Not constant. Went on [place] in June in [place]. Didn't remember any exposure.  No sneezing or allergies issues.  The Friday went to [facility] and spent the day and she felt really tired when she left and felt that way again as she was driving home. Lasted 2 hours.  Doesn't wake her up at night.  Feel ike mid lower sternum as well.  NO wheezing.  Occ reflux, maybe more than usually. She thinks could be anxiety.  No Cough or URI sxs.   She has been told not to eat wheat, gluten, dairy, soy and so hard to come up with diet.  If eats too may veggies then she gets really gassy adn bloated. She has lost a lot of weight in the 1.5 years.   Review of Systems  BP 119/68 mmHg  Pulse 67  Ht 5\' 4"  (1.626 m)  Wt 119 lb (53.978 kg)  BMI 20.42 kg/m2  SpO2 99%  PF 340 L/min  LMP 04/05/2011    Allergies  Allergen Reactions  . Biaxin [Clarithromycin]   . Concerta [Methylphenidate] Other (See Comments)    Says affected her sleep, made her jittery and would suck in her lips  . Penicillins     Past Medical History  Diagnosis Date  . Depression   . Allergy   . Diverticulitis   . IBS (irritable bowel syndrome)   . Colon polyp     Past Surgical History  Procedure Laterality Date  . Neuroma surgery      Right foot  . Dilation and curettage of uterus      History   Social History  . Marital Status: Significant Other    Spouse Name: N/A  . Number of Children: N/A  . Years of Education: N/A   Occupational History  . EDUCATOR     school teacher   Social History Main Topics  . Smoking status: Former Research scientist (life sciences)  . Smokeless tobacco: Not on file  . Alcohol Use: Yes     Comment: 1 drink/month  . Drug Use: No  . Sexual Activity: Yes   Other Topics Concern  . Not on file   Social History  Narrative    Family History  Problem Relation Age of Onset  . Alcohol abuse Mother   . COPD Mother   . Hypertension Father   . Other Father 40    AMI/ CVA at 19  . Stroke Father   . Colon cancer Father     Outpatient Encounter Prescriptions as of 08/03/2014  Medication Sig  . buPROPion (WELLBUTRIN XL) 150 MG 24 hr tablet Take 450 mg by mouth daily.   . cetirizine (ZYRTEC) 10 MG tablet Take 10 mg by mouth daily.    Marland Kitchen FLUoxetine (PROZAC) 40 MG capsule TAKE 1 CAPSULE BY MOUTH EVERY DAY  . lisdexamfetamine (VYVANSE) 40 MG capsule Take 40 mg by mouth every morning.  . naproxen (NAPROSYN) 500 MG tablet Take 1 tablet (500 mg total) by mouth 2 (two) times daily with a meal.  . NASONEX 50 MCG/ACT nasal spray INSTILL 1 SPRAY IN EACH NOSTRIL EVERY DAY  . omeprazole (PRILOSEC) 40 MG capsule Take 1 capsule (40 mg total) by mouth daily. Take 30 minutes before breakfast  . traMADol (  ULTRAM) 50 MG tablet 1-2 tabs by mouth Q8 hours, maximum 6 tabs per day.  . traZODone (DESYREL) 150 MG tablet TAKE 1 TABLET BY MOUTH EVERY NIGHT AT BEDTIME   No facility-administered encounter medications on file as of 08/03/2014.          Objective:   Physical Exam  Constitutional: She is oriented to person, place, and time. She appears well-developed and well-nourished.  HENT:  Head: Normocephalic and atraumatic.  Right Ear: External ear normal.  Left Ear: External ear normal.  Nose: Nose normal.  Mouth/Throat: Oropharynx is clear and moist.  TMs and canals are clear.   Eyes: Conjunctivae and EOM are normal. Pupils are equal, round, and reactive to light.  Neck: Neck supple. No thyromegaly present.  Cardiovascular: Normal rate, regular rhythm and normal heart sounds.   Pulmonary/Chest: Effort normal and breath sounds normal. She has no wheezes.  Lymphadenopathy:    She has no cervical adenopathy.  Neurological: She is alert and oriented to person, place, and time.  Skin: Skin is warm and dry.   Psychiatric: She has a normal mood and affect.          Assessment & Plan:  SOB. Unclear etiology at this point. Her lung exam is clear and she's not had any other upper respiratory symptoms. Peak flows are in the green zone and pulse ox is reassuring. Consider that this could be reflux related versus anxiety. Recommend restart her PPI daily.  She will call if not better in 2 weeks and we can get a CXR. Will call with results once available if needed. Also consider this could be related to anxiety. This really doesn't sound cardiac. It doesn't sound she's having any diaphragm issues. Without cough or other respiratory symptoms and infection is less likely. Since her symptoms are not persistent, a pneumothorax is very unlikely.  Will refer to nutrition therapy.  Will refer to Shirley Moody.  Because of her very specialized diet I think Shirley Moody could be very helpful in helping her created dietary plan that would be effective.

## 2014-08-21 ENCOUNTER — Encounter: Payer: Self-pay | Admitting: Sports Medicine

## 2014-08-21 ENCOUNTER — Ambulatory Visit (INDEPENDENT_AMBULATORY_CARE_PROVIDER_SITE_OTHER): Payer: BC Managed Care – PPO | Admitting: Sports Medicine

## 2014-08-21 VITALS — BP 101/70 | HR 68 | Ht 64.0 in | Wt 120.0 lb

## 2014-08-21 DIAGNOSIS — M7541 Impingement syndrome of right shoulder: Secondary | ICD-10-CM

## 2014-08-21 NOTE — Progress Notes (Signed)
  Subjective:    CC: Follow-up  HPI: Right shoulder pain: Completely resolved after subacromial and good humeral injections the last visit.  Past medical history, Surgical history, Family history not pertinant except as noted below, Social history, Allergies, and medications have been entered into the medical record, reviewed, and no changes needed.   Review of Systems: No fevers, chills, night sweats, weight loss, chest pain, or shortness of breath.   Objective:    General: Well Developed, well nourished, and in no acute distress.  Neuro: Alert and oriented x3, extra-ocular muscles intact, sensation grossly intact.  HEENT: Normocephalic, atraumatic, pupils equal round reactive to light, neck supple, no masses, no lymphadenopathy, thyroid nonpalpable.  Skin: Warm and dry, no rashes. Cardiac: Regular rate and rhythm, no murmurs rubs or gallops, no lower extremity edema.  Respiratory: Clear to auscultation bilaterally. Not using accessory muscles, speaking in full sentences. Right Shoulder: Inspection reveals no abnormalities, atrophy or asymmetry. Palpation is normal with no tenderness over AC joint or bicipital groove. ROM is full in all planes. Rotator cuff strength normal throughout. No signs of impingement with negative Neer and Hawkin's tests, empty can. Speeds and Yergason's tests normal. No labral pathology noted with negative Obrien's, negative crank, negative clunk, and good stability. Normal scapular function observed. No painful arc and no drop arm sign. No apprehension sign  Impression and Recommendations:

## 2014-08-21 NOTE — Assessment & Plan Note (Signed)
Continues to do extremely well after subacromial and glenohumeral injections, return as needed

## 2015-01-14 ENCOUNTER — Other Ambulatory Visit: Payer: Self-pay | Admitting: Family Medicine

## 2015-01-14 DIAGNOSIS — Z1231 Encounter for screening mammogram for malignant neoplasm of breast: Secondary | ICD-10-CM

## 2015-01-20 ENCOUNTER — Emergency Department
Admission: EM | Admit: 2015-01-20 | Discharge: 2015-01-20 | Disposition: A | Discharge status: Home / Self Care | Payer: BC Managed Care – PPO | Source: Home / Self Care | Attending: Family Medicine | Admitting: Family Medicine

## 2015-01-20 ENCOUNTER — Encounter: Payer: Self-pay | Admitting: Emergency Medicine

## 2015-01-20 DIAGNOSIS — J0100 Acute maxillary sinusitis, unspecified: Secondary | ICD-10-CM | POA: Diagnosis not present

## 2015-01-20 MED ORDER — PREDNISONE 20 MG PO TABS: ORAL_TABLET | ORAL | Status: DC

## 2015-01-20 MED ORDER — DOXYCYCLINE HYCLATE 100 MG PO CAPS: 100.0000 mg | ORAL_CAPSULE | Freq: Two times a day (BID) | ORAL | Status: DC

## 2015-01-20 NOTE — ED Notes (Signed)
Sinus problem, bloody, yellow, green mucus, headache, post nasal drip x 2 weeks

## 2015-01-20 NOTE — Discharge Instructions (Signed)
You may take 400-600mg  Ibuprofen (Motrin) every 6-8 hours for fever and pain (Or you may take Aleve- Naproxen) Alternate with Tylenol  You may take 500mg  Tylenol every 4-6 hours as needed for fever and pain  Follow-up with your primary care provider next week for recheck of symptoms if not improving.  Be sure to drink plenty of fluids and rest, at least 8hrs of sleep a night, preferably more while you are sick. Return urgent care or go to closest ER if you cannot keep down fluids/signs of dehydration, fever not reducing with Tylenol, difficulty breathing/wheezing, stiff neck, worsening condition, or other concerns (see below)  Please take antibiotics as prescribed and be sure to complete entire course even if you start to feel better to ensure infection does not come back.   Sinusitis, Adult Sinusitis is redness, soreness, and inflammation of the paranasal sinuses. Paranasal sinuses are air pockets within the bones of your face. They are located beneath your eyes, in the middle of your forehead, and above your eyes. In healthy paranasal sinuses, mucus is able to drain out, and air is able to circulate through them by way of your nose. However, when your paranasal sinuses are inflamed, mucus and air can become trapped. This can allow bacteria and other germs to grow and cause infection. Sinusitis can develop quickly and last only a short time (acute) or continue over a long period (chronic). Sinusitis that lasts for more than 12 weeks is considered chronic. CAUSES Causes of sinusitis include:  Allergies.  Structural abnormalities, such as displacement of the cartilage that separates your nostrils (deviated septum), which can decrease the air flow through your nose and sinuses and affect sinus drainage.  Functional abnormalities, such as when the small hairs (cilia) that line your sinuses and help remove mucus do not work properly or are not present. SIGNS AND SYMPTOMS Symptoms of acute and  chronic sinusitis are the same. The primary symptoms are pain and pressure around the affected sinuses. Other symptoms include:  Upper toothache.  Earache.  Headache.  Bad breath.  Decreased sense of smell and taste.  A cough, which worsens when you are lying flat.  Fatigue.  Fever.  Thick drainage from your nose, which often is green and may contain pus (purulent).  Swelling and warmth over the affected sinuses. DIAGNOSIS Your health care provider will perform a physical exam. During your exam, your health care provider may perform any of the following to help determine if you have acute sinusitis or chronic sinusitis:  Look in your nose for signs of abnormal growths in your nostrils (nasal polyps).  Tap over the affected sinus to check for signs of infection.  View the inside of your sinuses using an imaging device that has a light attached (endoscope). If your health care provider suspects that you have chronic sinusitis, one or more of the following tests may be recommended:  Allergy tests.  Nasal culture. A sample of mucus is taken from your nose, sent to a lab, and screened for bacteria.  Nasal cytology. A sample of mucus is taken from your nose and examined by your health care provider to determine if your sinusitis is related to an allergy. TREATMENT Most cases of acute sinusitis are related to a viral infection and will resolve on their own within 10 days. Sometimes, medicines are prescribed to help relieve symptoms of both acute and chronic sinusitis. These may include pain medicines, decongestants, nasal steroid sprays, or saline sprays. However, for sinusitis related to  a bacterial infection, your health care provider will prescribe antibiotic medicines. These are medicines that will help kill the bacteria causing the infection. Rarely, sinusitis is caused by a fungal infection. In these cases, your health care provider will prescribe antifungal medicine. For some  cases of chronic sinusitis, surgery is needed. Generally, these are cases in which sinusitis recurs more than 3 times per year, despite other treatments. HOME CARE INSTRUCTIONS  Drink plenty of water. Water helps thin the mucus so your sinuses can drain more easily.  Use a humidifier.  Inhale steam 3-4 times a day (for example, sit in the bathroom with the shower running).  Apply a warm, moist washcloth to your face 3-4 times a day, or as directed by your health care provider.  Use saline nasal sprays to help moisten and clean your sinuses.  Take medicines only as directed by your health care provider.  If you were prescribed either an antibiotic or antifungal medicine, finish it all even if you start to feel better. SEEK IMMEDIATE MEDICAL CARE IF:  You have increasing pain or severe headaches.  You have nausea, vomiting, or drowsiness.  You have swelling around your face.  You have vision problems.  You have a stiff neck.  You have difficulty breathing.   This information is not intended to replace advice given to you by your health care provider. Make sure you discuss any questions you have with your health care provider.   Document Released: 01/02/2005 Document Revised: 01/23/2014 Document Reviewed: 01/17/2011 Elsevier Interactive Patient Education 2016 Elsevier Inc.  Sinus Rinse WHAT IS A SINUS RINSE? A sinus rinse is a home treatment. It rinses your sinuses with a mixture of salt and water (saline solution). Sinuses are air-filled spaces in your skull behind the bones of your face and forehead. They open into your nasal cavity. To do a sinus rinse, you will need:  Saline solution.  Neti pot or spray bottle. This releases the saline solution into your nose and through your sinuses. You can buy neti pots and spray bottles at:  Your local pharmacy.  A health food store.  Online. WHEN WOULD I DO A SINUS RINSE?  A sinus rinse can help to clear your nasal cavity. It  can clear:   Mucus.  Dirt.  Dust.  Pollen. You may do a sinus rinse when you have:  A cold.  A virus.  Allergies.  A sinus infection.  A stuffy nose. If you are considering a sinus rinse:  Ask your child's doctor before doing a sinus rinse on your child.  Do not do a sinus rinse if you have had:  Ear or nasal surgery.  An ear infection.  Blocked ears. HOW DO I DO A SINUS RINSE?   Wash your hands.  Disinfect your device using the directions that came with the device.  Dry your device.  Use the solution that comes with your device or one that is sold separately in stores. Follow the mixing directions on the package.  Fill your device with the amount of saline solution as stated in the device instructions.  Stand over a sink and tilt your head sideways over the sink.  Place the spout of the device in your upper nostril (the one closer to the ceiling).  Gently pour or squeeze the saline solution into the nasal cavity. The liquid should drain to the lower nostril if you are not too congested.  Gently blow your nose. Blowing too hard may cause ear pain.  Repeat in the other nostril.  Clean and rinse your device with clean water.  Air-dry your device. ARE THERE RISKS OF A SINUS RINSE?  Sinus rinse is normally very safe and helpful. However, there are a few risks, which include:   A burning feeling in the sinuses. This may happen if you do not make the saline solution as instructed. Make sure to follow all directions when making the saline solution.  Infection from unclean water. This is rare, but possible.  Nasal irritation.   This information is not intended to replace advice given to you by your health care provider. Make sure you discuss any questions you have with your health care provider.   Document Released: 07/30/2013 Document Reviewed: 07/30/2013 Elsevier Interactive Patient Education [employer].

## 2015-01-20 NOTE — ED Provider Notes (Signed)
CSN: [id number]     Arrival date & time 01/20/15  1255 History   First MD Initiated Contact with Patient 01/20/15 1258     Chief Complaint  Patient presents with  . Sinus Problem   (Consider location/radiation/quality/duration/timing/severity/associated sxs/prior Treatment) HPI  Pt is a 55yo female presenting to [facility] with 2 week hx of worsening sinus congestion with yellow-green bloody nasal discharge, frontal headache, and post-nasal drip.  She has been using Naproxen and Neti pot sinus rinses with only temporary relief. 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  Denies fever, chills, n/v/d.   Past Medical History  Diagnosis Date  . Depression   . Allergy   . Diverticulitis   . IBS (irritable bowel syndrome)   . Colon polyp    Past Surgical History  Procedure Laterality Date  . Neuroma surgery      Right foot  . Dilation and curettage of uterus     Family History  Problem Relation Age of Onset  . Alcohol abuse Mother   . COPD Mother   . Hypertension Father   . Other Father 55    AMI/ CVA at 59  . Stroke Father   . Colon cancer Father    Social History  Substance Use Topics  . Smoking status: Former Research scientist (life sciences)  . Smokeless tobacco: None  . Alcohol Use: Yes     Comment: 1 drink/month   OB History    Gravida Para Term Preterm AB TAB SAB Ectopic Multiple Living   1 0   1          Review of Systems  Constitutional: Negative for fever and chills.  HENT: Positive for congestion, postnasal drip, rhinorrhea, sinus pressure and sneezing. Negative for ear pain, sore throat, trouble swallowing and voice change.   Respiratory: Negative for cough and shortness of breath.   Cardiovascular: Negative for chest pain and palpitations.  Gastrointestinal: Positive for nausea. Negative for vomiting, abdominal pain and diarrhea.  Musculoskeletal: Negative for myalgias, back pain and arthralgias.  Skin: Negative for rash.  Neurological: Positive for headaches.  Negative for dizziness and light-headedness.    Allergies  Biaxin; Concerta; and Penicillins  Home Medications   Prior to Admission medications   Medication Sig Start Date End Date Taking? Authorizing Provider  buPROPion (WELLBUTRIN XL) 150 MG 24 hr tablet Take 450 mg by mouth daily.     Historical Provider, MD  cetirizine (ZYRTEC) 10 MG tablet Take 10 mg by mouth daily.      Historical Provider, MD  doxycycline (VIBRAMYCIN) 100 MG capsule Take 1 capsule (100 mg total) by mouth 2 (two) times daily. One po bid x 7 days 01/20/15   Noland Fordyce, PA-C  FLUoxetine (PROZAC) 40 MG capsule TAKE 1 CAPSULE BY MOUTH EVERY DAY 11/19/13   Hali Marry, MD  lisdexamfetamine (VYVANSE) 40 MG capsule Take 40 mg by mouth every morning.    Historical Provider, MD  naproxen (NAPROSYN) 500 MG tablet Take 1 tablet (500 mg total) by mouth 2 (two) times daily with a meal. 03/11/13   Silverio Decamp, MD  NASONEX 50 MCG/ACT nasal spray INSTILL 1 SPRAY IN EACH NOSTRIL EVERY DAY 06/30/14   Hali Marry, MD  omeprazole (PRILOSEC) 40 MG capsule Take 1 capsule (40 mg total) by mouth daily. Take 30 minutes before breakfast 03/28/14   Kandra Nicolas, MD  predniSONE (DELTASONE) 20 MG tablet 2 tabs po daily x 3 days 01/20/15   Junie Panning  Hilda Blades, PA-C  traMADol (ULTRAM) 50 MG tablet 1-2 tabs by mouth Q8 hours, maximum 6 tabs per day. 07/24/14   Silverio Decamp, MD  traZODone (DESYREL) 150 MG tablet TAKE 1 TABLET BY MOUTH EVERY NIGHT AT BEDTIME 11/07/12   Hali Marry, MD   Meds Ordered and Administered this Visit  Medications - No data to display  BP 136/74 mmHg  Pulse 90  Temp(Src) 98.3 F (36.8 C) (Oral)  Ht 5\' 4"  (1.626 m)  Wt 116 lb (52.617 kg)  BMI 19.90 kg/m2  SpO2 98%  LMP 04/05/2011 No data found.   Physical Exam  Constitutional: She appears well-developed and well-nourished. No distress.  HENT:  Head: Normocephalic and atraumatic.  Right Ear: Hearing, tympanic membrane, external  ear and ear canal normal.  Left Ear: Hearing, tympanic membrane, external ear and ear canal normal.  Nose: Mucosal edema present. Right sinus exhibits maxillary sinus tenderness. Right sinus exhibits no frontal sinus tenderness. Left sinus exhibits maxillary sinus tenderness. Left sinus exhibits no frontal sinus tenderness.  Mouth/Throat: Uvula is midline and mucous membranes are normal. Posterior oropharyngeal erythema present. No oropharyngeal exudate, posterior oropharyngeal edema or tonsillar abscesses.  Eyes: Conjunctivae are normal. No scleral icterus.  Neck: Normal range of motion. Neck supple.  Hoarse voice but no stridor  Cardiovascular: Normal rate, regular rhythm and normal heart sounds.   Pulmonary/Chest: Effort normal and breath sounds normal. No stridor. No respiratory distress. She has no wheezes. She has no rales. She exhibits no tenderness.  Abdominal: Soft. Bowel sounds are normal. She exhibits no distension and no mass. There is no tenderness. There is no rebound and no guarding.  Musculoskeletal: Normal range of motion.  Lymphadenopathy:    She has no cervical adenopathy.  Neurological: She is alert.  Skin: Skin is warm and dry. She is not diaphoretic.  Nursing note and vitals reviewed.   ED Course  Procedures (including critical care time)  Labs Review Labs Reviewed - No data to display  Imaging Review No results found.   MDM   1. Acute maxillary sinusitis, recurrence not specified    Hx and exam c/w sinusitis  Rx: doxycycline and prednisone (40mg  x 3 days) Encouraged to continue naproxen as needed and may add acetaminophen for fever and pain. May also continue Neti pot.  F/u with PCP in 7-10 days if not improving, sooner if worsening. Patient verbalized understanding and agreement with treatment plan.     Noland Fordyce, PA-C 01/20/15 1347

## 2015-01-22 ENCOUNTER — Ambulatory Visit: Payer: BC Managed Care – PPO | Admitting: Family Medicine

## 2015-02-01 ENCOUNTER — Ambulatory Visit (INDEPENDENT_AMBULATORY_CARE_PROVIDER_SITE_OTHER): Payer: BC Managed Care – PPO | Admitting: Obstetrics & Gynecology

## 2015-02-01 ENCOUNTER — Encounter: Payer: Self-pay | Admitting: Obstetrics & Gynecology

## 2015-02-01 VITALS — BP 108/59 | HR 57 | Resp 16 | Ht 64.0 in | Wt 120.0 lb

## 2015-02-01 DIAGNOSIS — N952 Postmenopausal atrophic vaginitis: Secondary | ICD-10-CM

## 2015-02-01 DIAGNOSIS — Z1151 Encounter for screening for human papillomavirus (HPV): Secondary | ICD-10-CM

## 2015-02-01 DIAGNOSIS — N9089 Other specified noninflammatory disorders of vulva and perineum: Secondary | ICD-10-CM | POA: Diagnosis not present

## 2015-02-01 DIAGNOSIS — Z01419 Encounter for gynecological examination (general) (routine) without abnormal findings: Secondary | ICD-10-CM | POA: Diagnosis not present

## 2015-02-01 DIAGNOSIS — Z124 Encounter for screening for malignant neoplasm of cervix: Secondary | ICD-10-CM | POA: Diagnosis not present

## 2015-02-01 MED ORDER — ESTRADIOL 0.1 MG/GM VA CREA: TOPICAL_CREAM | VAGINAL | Status: DC

## 2015-02-01 NOTE — Addendum Note (Signed)
Addended by: Asencion Islam on: 02/01/2015 02:32 PM   Modules accepted: Orders

## 2015-02-01 NOTE — Progress Notes (Signed)
  Subjective:    Shirley Moody is a 55 y.o. female who presents for an annual exam. The patient has no complaints today. The patient is not currently sexually active. GYN screening history: last pap: approximate date 09/2012 and was normal and last mammogram: was normal. The patient wears seatbelts: no. The patient participates in regular exercise: yes. Has the patient ever been transfused or tattooed?:  Pt went to therapy since last visit and is doing much better and her relationship with partner is better C/o vaginal burning with any soap (including dove) C/o urinary stream spraying under toilet seat nad onto legs when she is sitting on the toilet.  Menstrual History: OB History    Gravida Para Term Preterm AB TAB SAB Ectopic Multiple Living   1 0   1            Patient's last menstrual period was 04/05/2011.    The following portions of the patient's history were reviewed and updated as appropriate: allergies, current medications, past family history, past medical history, past social history, past surgical history and problem list.  Review of Systems Pertinent items noted in HPI and remainder of comprehensive ROS otherwise negative.    Objective:      Filed Vitals:   02/01/15 1333  BP: 108/59  Pulse: 57  Resp: 16  Height: 5\' 4"  (1.626 m)  Weight: 120 lb (54.432 kg)   Vitals:  WNL General appearance: alert, cooperative and no distress  HEENT: Normocephalic, without obvious abnormality, atraumatic Eyes: negative Throat: lips, mucosa, and tongue normal; teeth and gums normal  Respiratory: Clear to auscultation bilaterally  CV: Regular rate and rhythm  Breasts:  Normal appearance, no masses or tenderness, left nipple retraction (stable for patient)  GI: Soft, non-tender; bowel sounds normal; no masses,  no organomegaly  GU: External Genitalia:  Tanner V, cheesy sebaceous cyst on left labia as last exam in 2014 Urethra:  No prolapse   Vagina: Pale pink, decreased  elasticity, fissures with examination, unable to open speculum very wide due to pain and tissue limitation   Cervix: Very little cervical tissue seen (s/p LEEP)  Uterus:  Unable to complete exam due to pain  Adnexa: Unable to complete exam due to pain  Musculoskeletal: No edema, redness or tenderness in the calves or thighs  Skin: No lesions or rash  Lymphatic: Axillary adenopathy: none    Psychiatric: Normal mood and behavior      .    Assessment:    Healthy female exam.   Inverted left nipple (stable) Atrohpic vaginitis  Plan:    Mammogram Pap with cotesting Estrace for atrophic vaginitis Hopefully will also help urinary complaints  RTC in 6-8 weeks for reevaluation and lance sebaceous cyst.

## 2015-02-03 LAB — CYTOLOGY - PAP

## 2015-02-04 ENCOUNTER — Ambulatory Visit (INDEPENDENT_AMBULATORY_CARE_PROVIDER_SITE_OTHER): Payer: BC Managed Care – PPO

## 2015-02-04 DIAGNOSIS — Z1231 Encounter for screening mammogram for malignant neoplasm of breast: Secondary | ICD-10-CM

## 2015-02-04 DIAGNOSIS — R928 Other abnormal and inconclusive findings on diagnostic imaging of breast: Secondary | ICD-10-CM

## 2015-02-05 ENCOUNTER — Ambulatory Visit (INDEPENDENT_AMBULATORY_CARE_PROVIDER_SITE_OTHER): Payer: BC Managed Care – PPO | Admitting: Family Medicine

## 2015-02-05 ENCOUNTER — Encounter: Payer: Self-pay | Admitting: Family Medicine

## 2015-02-05 VITALS — BP 109/59 | HR 80 | Temp 98.5°F | Wt 118.0 lb

## 2015-02-05 DIAGNOSIS — R52 Pain, unspecified: Secondary | ICD-10-CM | POA: Diagnosis not present

## 2015-02-05 DIAGNOSIS — J020 Streptococcal pharyngitis: Secondary | ICD-10-CM | POA: Diagnosis not present

## 2015-02-05 DIAGNOSIS — R509 Fever, unspecified: Secondary | ICD-10-CM | POA: Diagnosis not present

## 2015-02-05 DIAGNOSIS — J029 Acute pharyngitis, unspecified: Secondary | ICD-10-CM

## 2015-02-05 LAB — POCT INFLUENZA A/B
INFLUENZA A, POC: NEGATIVE
Influenza B, POC: NEGATIVE

## 2015-02-05 LAB — CBC WITH DIFFERENTIAL/PLATELET
BASOS ABS: 0 10*3/uL (ref 0.0–0.1)
BASOS PCT: 0 % (ref 0–1)
EOS PCT: 2 % (ref 0–5)
Eosinophils Absolute: 0.1 10*3/uL (ref 0.0–0.7)
HEMATOCRIT: 41.3 % (ref 36.0–46.0)
Hemoglobin: 14.3 g/dL (ref 12.0–15.0)
Lymphocytes Relative: 31 % (ref 12–46)
Lymphs Abs: 1.3 10*3/uL (ref 0.7–4.0)
MCH: 32.8 pg (ref 26.0–34.0)
MCHC: 34.6 g/dL (ref 30.0–36.0)
MCV: 94.7 fL (ref 78.0–100.0)
MPV: 9.4 fL (ref 8.6–12.4)
Monocytes Absolute: 0.3 10*3/uL (ref 0.1–1.0)
Monocytes Relative: 8 % (ref 3–12)
Neutro Abs: 2.5 10*3/uL (ref 1.7–7.7)
Neutrophils Relative %: 59 % (ref 43–77)
Platelets: 211 10*3/uL (ref 150–400)
RBC: 4.36 MIL/uL (ref 3.87–5.11)
RDW: 12.9 % (ref 11.5–15.5)
WBC: 4.2 10*3/uL (ref 4.0–10.5)

## 2015-02-05 LAB — POCT RAPID STREP A (OFFICE): Rapid Strep A Screen: NEGATIVE

## 2015-02-05 MED ORDER — CEFDINIR 300 MG PO CAPS: 300.0000 mg | ORAL_CAPSULE | Freq: Two times a day (BID) | ORAL | Status: DC

## 2015-02-05 NOTE — Progress Notes (Signed)
Subjective:    Patient ID: Shirley Moody, female    DOB: [birth year], 55 y.o.   MRN: [id number]  HPI Has had fatigue and intermittant fever x 4 weeks.  Says at the beginning had some green nasal d/c. Went to UC about 3 weeks ago and put on doxy and prednisone but doesn't feel any better.  Intermittant ST and cough. She has had walking Pneumonia better.  No SOB or chest pain.  The edges of her nose has been tender and she has some swelling under both eyes.   No GI symptoms. She has had a little bit of itchy skin but no rash. She thinks that she skin is her typical dry skin that she gets in the wintertime. No worsening or alleviating factors. She says she feels extremely tired when she first gets up and then feels a little bit better by noon, but then by the afternoon she feels extremely fatigued. She's been sleeping an extra 5 hours on the weekends.   Review of Systems   BP 109/59 mmHg  Pulse 80  Temp(Src) 98.5 F (36.9 C) (Oral)  Wt 118 lb (53.524 kg)  SpO2 97%  LMP 04/05/2011    Allergies  Allergen Reactions  . Biaxin [Clarithromycin]   . Concerta [Methylphenidate] Other (See Comments)    Says affected her sleep, made her jittery and would suck in her lips  . Penicillins     Past Medical History  Diagnosis Date  . Depression   . Allergy   . Diverticulitis   . IBS (irritable bowel syndrome)   . Colon polyp     Past Surgical History  Procedure Laterality Date  . Neuroma surgery      Right foot  . Dilation and curettage of uterus      Social History   Social History  . Marital Status: Significant Other    Spouse Name: N/A  . Number of Children: N/A  . Years of Education: N/A   Occupational History  . EDUCATOR     school teacher   Social History Main Topics  . Smoking status: Former Research scientist (life sciences)  . Smokeless tobacco: Not on file  . Alcohol Use: 0.0 oz/week    0 Standard drinks or equivalent per week     Comment: 1 drink/month  . Drug Use: No  . Sexual  Activity:    Partners: Male   Other Topics Concern  . Not on file   Social History Narrative    Family History  Problem Relation Age of Onset  . Alcohol abuse Mother   . COPD Mother   . Hypertension Father   . Other Father 74    AMI/ CVA at 53  . Stroke Father   . Colon cancer Father     Outpatient Encounter Prescriptions as of 02/05/2015  Medication Sig  . buPROPion (WELLBUTRIN XL) 150 MG 24 hr tablet Take 450 mg by mouth daily.   . cetirizine (ZYRTEC) 10 MG tablet Take 10 mg by mouth daily.    Marland Kitchen estradiol (ESTRACE VAGINAL) 0.1 MG/GM vaginal cream Insert 1 applicator full per vagina nightly for 2 weeks then 2 times a week.  Marland Kitchen FLUoxetine (PROZAC) 40 MG capsule TAKE 1 CAPSULE BY MOUTH EVERY DAY  . lisdexamfetamine (VYVANSE) 40 MG capsule Take 40 mg by mouth every morning.  . naproxen (NAPROSYN) 500 MG tablet Take 1 tablet (500 mg total) by mouth 2 (two) times daily with a meal.  . NASONEX 50 MCG/ACT nasal spray INSTILL 1  SPRAY IN EACH NOSTRIL EVERY DAY  . traZODone (DESYREL) 150 MG tablet TAKE 1 TABLET BY MOUTH EVERY NIGHT AT BEDTIME  . cefdinir (OMNICEF) 300 MG capsule Take 1 capsule (300 mg total) by mouth 2 (two) times daily.   No facility-administered encounter medications on file as of 02/05/2015.          Objective:   Physical Exam  Constitutional: She is oriented to person, place, and time. She appears well-developed and well-nourished.  HENT:  Head: Normocephalic and atraumatic.  Right Ear: External ear normal.  Left Ear: External ear normal.  Nose: Nose normal.  Mouth/Throat: Oropharynx is clear and moist.  TMs and canals are clear. She does look puffy under both eyes and across the nasal bridge. No erythema.    Eyes: Conjunctivae and EOM are normal. Pupils are equal, round, and reactive to light.  Neck: Neck supple. No thyromegaly present.  Cardiovascular: Normal rate, regular rhythm and normal heart sounds.   Pulmonary/Chest: Effort normal and breath sounds  normal. She has no wheezes.  Lymphadenopathy:    She has no cervical adenopathy.  Neurological: She is alert and oriented to person, place, and time.  Skin: Skin is warm and dry.  Psychiatric: She has a normal mood and affect.          Assessment & Plan:  Fatigue - unclear etiology at this point. I'm most suspicious for possible CMV or EBV. Them also going to check her thyroid and rule out anemia. I also think she could still have a sinus infection especially with some pain and swelling over the nasal bridge and some puffiness under the eyes. It may be that the doxycycline did not clear up the infection. She wants to wait for the blood work comes back before starting another round of antibiotics as she has had a history of C. difficile in the past and wants to be very careful about taking antibiotics. I think that's absolutely reasonable. I did go ahead and write her prescription just in case her weekend she feels like she is getting worse she can go ahead and fill the prescription.

## 2015-02-06 LAB — C-REACTIVE PROTEIN: CRP: <0.5 mg/dL (ref <0.60)

## 2015-02-06 LAB — SEDIMENTATION RATE: Sed Rate: 4 mm/hr (ref 0–30)

## 2015-02-06 LAB — TSH: TSH: 1.256 u[IU]/mL (ref 0.350–4.500)

## 2015-02-08 ENCOUNTER — Other Ambulatory Visit: Payer: Self-pay | Admitting: Family Medicine

## 2015-02-08 DIAGNOSIS — R928 Other abnormal and inconclusive findings on diagnostic imaging of breast: Secondary | ICD-10-CM

## 2015-02-08 LAB — EPSTEIN-BARR VIRUS VCA, IGG: EBV VCA IgG: 86 U/mL — ABNORMAL HIGH (ref <18.0)

## 2015-02-08 LAB — EPSTEIN-BARR VIRUS VCA, IGM: EBV VCA IgM: 26.7 U/mL (ref <36.0)

## 2015-02-09 LAB — CMV IGM: CMV IgM: 17.4 AU/mL (ref <30.00)

## 2015-02-10 ENCOUNTER — Ambulatory Visit
Admission: RE | Admit: 2015-02-10 | Discharge: 2015-02-10 | Disposition: A | Discharge status: Home / Self Care | Payer: BC Managed Care – PPO | Source: Ambulatory Visit | Attending: Family Medicine | Admitting: Family Medicine

## 2015-02-10 DIAGNOSIS — R928 Other abnormal and inconclusive findings on diagnostic imaging of breast: Secondary | ICD-10-CM

## 2015-03-10 ENCOUNTER — Other Ambulatory Visit: Payer: Self-pay | Admitting: Family Medicine

## 2015-03-10 ENCOUNTER — Emergency Department
Admission: EM | Admit: 2015-03-10 | Discharge: 2015-03-10 | Disposition: A | Discharge status: Home / Self Care | Payer: BC Managed Care – PPO | Source: Home / Self Care | Attending: Family Medicine | Admitting: Family Medicine

## 2015-03-10 ENCOUNTER — Encounter: Payer: Self-pay | Admitting: Emergency Medicine

## 2015-03-10 DIAGNOSIS — J069 Acute upper respiratory infection, unspecified: Secondary | ICD-10-CM

## 2015-03-10 DIAGNOSIS — J029 Acute pharyngitis, unspecified: Secondary | ICD-10-CM

## 2015-03-10 DIAGNOSIS — B9789 Other viral agents as the cause of diseases classified elsewhere: Secondary | ICD-10-CM

## 2015-03-10 LAB — POCT RAPID STREP A (OFFICE): RAPID STREP A SCREEN: NEGATIVE

## 2015-03-10 MED ORDER — DOXYCYCLINE HYCLATE 100 MG PO CAPS: 100.0000 mg | ORAL_CAPSULE | Freq: Two times a day (BID) | ORAL | Status: DC

## 2015-03-10 MED ORDER — BENZONATATE 200 MG PO CAPS: 200.0000 mg | ORAL_CAPSULE | Freq: Every day | ORAL | Status: DC

## 2015-03-10 NOTE — ED Provider Notes (Signed)
CSN: [id number]     Arrival date & time 03/10/15  1414 History   First MD Initiated Contact with Patient 03/10/15 1546     Chief Complaint  Patient presents with  . Sore Throat      HPI Comments: Five days ago patient developed flu or cold-like symptoms including mild sore throat, myalgias, sinus congestion, headache, and fatigue.  She developed fever to 102, and chills have persisted.  Yesterday she developed a cough. She has had pneumonia in the past.  The history is provided by the patient.    Past Medical History  Diagnosis Date  . Depression   . Allergy   . Diverticulitis   . IBS (irritable bowel syndrome)   . Colon polyp    Past Surgical History  Procedure Laterality Date  . Neuroma surgery      Right foot  . Dilation and curettage of uterus     Family History  Problem Relation Age of Onset  . Alcohol abuse Mother   . COPD Mother   . Hypertension Father   . Other Father 74    AMI/ CVA at 56  . Stroke Father   . Colon cancer Father    Social History  Substance Use Topics  . Smoking status: Former Research scientist (life sciences)  . Smokeless tobacco: None  . Alcohol Use: 0.0 oz/week    0 Standard drinks or equivalent per week     Comment: 1 drink/month   OB History    Gravida Para Term Preterm AB TAB SAB Ectopic Multiple Living   1 0   1          Review of Systems + sore throat + cough No pleuritic pain No wheezing + nasal congestion + post-nasal drainage No sinus pain/pressure No itchy/red eyes No earache No hemoptysis No SOB + fever, + chills No nausea No vomiting No abdominal pain No diarrhea No urinary symptoms No skin rash + fatigue + myalgias + headache Used OTC meds without relief  Allergies  Biaxin; Concerta; and Penicillins  Home Medications   Prior to Admission medications   Medication Sig Start Date End Date Taking? Authorizing Provider  benzonatate (TESSALON) 200 MG capsule Take 1 capsule (200 mg total) by mouth at bedtime. Take as needed for  cough 03/10/15   Kandra Nicolas, MD  buPROPion (WELLBUTRIN XL) 150 MG 24 hr tablet Take 450 mg by mouth daily.     Historical Provider, MD  cetirizine (ZYRTEC) 10 MG tablet Take 10 mg by mouth daily.      Historical Provider, MD  doxycycline (VIBRAMYCIN) 100 MG capsule Take 1 capsule (100 mg total) by mouth 2 (two) times daily. Take with food (Rx void after 03/18/15) 03/10/15   Kandra Nicolas, MD  estradiol (ESTRACE VAGINAL) 0.1 MG/GM vaginal cream Insert 1 applicator full per vagina nightly for 2 weeks then 2 times a week. 02/01/15   Guss Bunde, MD  FLUoxetine (PROZAC) 40 MG capsule TAKE 1 CAPSULE BY MOUTH EVERY DAY 11/19/13   Hali Marry, MD  lisdexamfetamine (VYVANSE) 40 MG capsule Take 40 mg by mouth every morning.    Historical Provider, MD  naproxen (NAPROSYN) 500 MG tablet Take 1 tablet (500 mg total) by mouth 2 (two) times daily with a meal. 03/11/13   Silverio Decamp, MD  NASONEX 50 MCG/ACT nasal spray INSTILL 1 SPRAY IN EACH NOSTRIL EVERY DAY 06/30/14   Hali Marry, MD  traZODone (DESYREL) 150 MG tablet TAKE 1 TABLET BY  MOUTH EVERY NIGHT AT BEDTIME 11/07/12   Hali Marry, MD   Meds Ordered and Administered this Visit  Medications - No data to display  BP 106/68 mmHg  Pulse 68  Temp(Src) 98.3 F (36.8 C) (Oral)  Ht 5\' 4"  (1.626 m)  Wt 115 lb (52.164 kg)  BMI 19.73 kg/m2  SpO2 97%  LMP 04/05/2011 No data found.   Physical Exam Nursing notes and Vital Signs reviewed. Appearance:  Patient appears stated age, and in no acute distress Eyes:  Pupils are equal, round, and reactive to light and accomodation.  Extraocular movement is intact.  Conjunctivae are not inflamed  Ears:  Canals normal.  Tympanic membranes normal.  Nose:  Mildly congested turbinates.  No sinus tenderness.    Pharynx:  Normal Neck:  Supple.  Tender enlarged posterior nodes are palpated bilaterally  Lungs:  Clear to auscultation.  Breath sounds are equal.  Moving air  well. Chest:  Distinct tenderness to palpation over the mid-sternum.  Heart:  Regular rate and rhythm without murmurs, rubs, or gallops.  Abdomen:  Nontender without masses or hepatosplenomegaly.  Bowel sounds are present.  No CVA or flank tenderness.  Extremities:  No edema.  Skin:  No rash present.   ED Course  Procedures none    Labs Reviewed  POCT RAPID STREP A (OFFICE) negative     MDM   1. Acute pharyngitis, unspecified etiology   2. Viral URI with cough    There is no evidence of bacterial infection today.  Treat symptomatically for now  Prescription written for Benzonatate (Tessalon) to take at bedtime for night-time cough.  Take plain guaifenesin (1200mg  extended release tabs such as Mucinex) twice daily, with plenty of water, for cough and congestion.  May add Pseudoephedrine (30mg , one or two every 4 to 6 hours) for sinus congestion.  Get adequate rest.   May use Afrin nasal spray (or generic oxymetazoline) twice daily for about 5 days and then discontinue.  Also recommend using saline nasal spray several times daily and saline nasal irrigation (AYR is a common brand).  Use Nasonex nasal spray each morning after using Afrin nasal spray and saline nasal irrigation. Try warm salt water gargles for sore throat.  Stop all antihistamines for now, and other non-prescription cough/cold preparations. May take Ibuprofen 200mg , 4 tabs every 8 hours with food for chest/sternum discomfort. Begin Doxycycline if not improving about five days or if persistent fever develops (Given a prescription to hold, with an expiration date)  Follow-up with family doctor if not improving about one week.    Kandra Nicolas, MD 03/10/15 [phone]

## 2015-03-10 NOTE — Discharge Instructions (Signed)
Take plain guaifenesin (1200mg  extended release tabs such as Mucinex) twice daily, with plenty of water, for cough and congestion.  May add Pseudoephedrine (30mg , one or two every 4 to 6 hours) for sinus congestion.  Get adequate rest.   May use Afrin nasal spray (or generic oxymetazoline) twice daily for about 5 days and then discontinue.  Also recommend using saline nasal spray several times daily and saline nasal irrigation (AYR is a common brand).  Use Nasonex nasal spray each morning after using Afrin nasal spray and saline nasal irrigation. Try warm salt water gargles for sore throat.  Stop all antihistamines for now, and other non-prescription cough/cold preparations. May take Ibuprofen 200mg , 4 tabs every 8 hours with food for chest/sternum discomfort. Begin Doxycycline if not improving about five days or if persistent fever develops   Follow-up with family doctor if not improving about one week.

## 2015-03-10 NOTE — ED Notes (Signed)
Sore throat, fever, congestion, cough x 4 days

## 2015-03-15 ENCOUNTER — Encounter: Payer: Self-pay | Admitting: Family Medicine

## 2015-03-15 ENCOUNTER — Ambulatory Visit (INDEPENDENT_AMBULATORY_CARE_PROVIDER_SITE_OTHER): Payer: BC Managed Care – PPO | Admitting: Family Medicine

## 2015-03-15 ENCOUNTER — Ambulatory Visit (INDEPENDENT_AMBULATORY_CARE_PROVIDER_SITE_OTHER): Payer: BC Managed Care – PPO

## 2015-03-15 VITALS — BP 126/71 | HR 97 | Temp 98.2°F | Wt 122.0 lb

## 2015-03-15 DIAGNOSIS — R509 Fever, unspecified: Secondary | ICD-10-CM | POA: Diagnosis not present

## 2015-03-15 DIAGNOSIS — R05 Cough: Secondary | ICD-10-CM

## 2015-03-15 DIAGNOSIS — R059 Cough, unspecified: Secondary | ICD-10-CM

## 2015-03-15 MED ORDER — ALBUTEROL SULFATE 108 (90 BASE) MCG/ACT IN AEPB: 1.0000 | INHALATION_SPRAY | RESPIRATORY_TRACT | Status: DC | PRN

## 2015-03-15 MED ORDER — GUAIFENESIN-CODEINE 100-10 MG/5ML PO SOLN: 5.0000 mL | Freq: Every evening | ORAL | Status: DC | PRN

## 2015-03-15 MED ORDER — PREDNISONE 10 MG PO TABS: 30.0000 mg | ORAL_TABLET | Freq: Every day | ORAL | Status: DC

## 2015-03-15 MED ORDER — IPRATROPIUM BROMIDE 0.06 % NA SOLN: 2.0000 | NASAL | Status: DC | PRN

## 2015-03-15 NOTE — Progress Notes (Signed)
Shirley Moody is a 55 y.o. female who presents to [facility] today for cough congestion and hoarse voice and cough congestion and hoarse voice and fatigue. Symptoms present for about 8 days. Cough is nonproductive. Patient was seen about 5 days ago in urgent care where she was thought to have viral URI or bronchitis. She was treated with Tessalon Perles, doxycycline and over-the-counter medicines which helped not much. She denies any fevers or chills vomiting or diarrhea. Symptoms are moderate and persistent.   Past Medical History  Diagnosis Date  . Depression   . Allergy   . Diverticulitis   . IBS (irritable bowel syndrome)   . Colon polyp    Past Surgical History  Procedure Laterality Date  . Neuroma surgery      Right foot  . Dilation and curettage of uterus     Social History  Substance Use Topics  . Smoking status: Former Research scientist (life sciences)  . Smokeless tobacco: Not on file  . Alcohol Use: 0.0 oz/week    0 Standard drinks or equivalent per week     Comment: 1 drink/month   family history includes Alcohol abuse in her mother; COPD in her mother; Colon cancer in her father; Hypertension in her father; Other (age of onset: 18) in her father; Stroke in her father.  ROS as above Medications: Current Outpatient Prescriptions  Medication Sig Dispense Refill  . benzonatate (TESSALON) 200 MG capsule Take 1 capsule (200 mg total) by mouth at bedtime. Take as needed for cough 12 capsule 0  . buPROPion (WELLBUTRIN XL) 150 MG 24 hr tablet Take 450 mg by mouth daily.     . cetirizine (ZYRTEC) 10 MG tablet Take 10 mg by mouth daily.      Marland Kitchen doxycycline (VIBRAMYCIN) 100 MG capsule Take 1 capsule (100 mg total) by mouth 2 (two) times daily. Take with food (Rx void after 03/18/15) 20 capsule 0  . estradiol (ESTRACE VAGINAL) 0.1 MG/GM vaginal cream Insert 1 applicator full per vagina nightly for 2  weeks then 2 times a week. 42.5 g 2  . FLUoxetine (PROZAC) 40 MG capsule TAKE 1 CAPSULE BY MOUTH EVERY DAY 90 capsule 0  . lisdexamfetamine (VYVANSE) 40 MG capsule Take 40 mg by mouth every morning.    . mometasone (NASONEX) 50 MCG/ACT nasal spray INSTILL 1 SPRAY IN EACH NOSTRIL ONCE DAILY 17 g 0  . naproxen (NAPROSYN) 500 MG tablet Take 1 tablet (500 mg total) by mouth 2 (two) times daily with a meal. 60 tablet 3  . traZODone (DESYREL) 150 MG tablet TAKE 1 TABLET BY MOUTH EVERY NIGHT AT BEDTIME 90 tablet 0  . Albuterol Sulfate (PROAIR RESPICLICK) 123XX123 (90 Base) MCG/ACT AEPB Inhale 1-2 puffs into the lungs every 4 (four) hours as needed. 1 each 1  . guaiFENesin-codeine 100-10 MG/5ML syrup Take 5 mLs by mouth at bedtime as needed for cough. 120 mL 0  . ipratropium (ATROVENT) 0.06 % nasal spray Place 2 sprays into both nostrils every 4 (four) hours as needed for rhinitis. 10 mL 6  . predniSONE (DELTASONE) 10 MG tablet Take 3 tablets (30 mg total) by mouth daily with breakfast. 15 tablet 0   No current facility-administered medications for this visit.   Allergies  Allergen Reactions  . Biaxin [Clarithromycin]   . Concerta [Methylphenidate] Other (See Comments)    Says affected her sleep, made her jittery and would suck in her lips  . Penicillins  Exam:  BP 126/71 mmHg  Pulse 97  Temp(Src) 98.2 F (36.8 C) (Oral)  Wt 122 lb (55.339 kg)  SpO2 97%  LMP 04/05/2011 Gen: Well NAD HEENT: EOMI,  MMM posterior pharynx with cobblestoning. Mild cervical lymphadenopathy is present bilaterally. Lungs: Normal work of breathing. Coarse breath sounds and wheezing present on right lung field. Heart: RRR no MRG Abd: NABS, Soft. Nondistended, Nontender Exts: Brisk capillary refill, warm and well perfused.    No results found for this or any previous visit (from the past 24 hour(s)). No results found.   Please see individual assessment and plan sections.

## 2015-03-15 NOTE — Assessment & Plan Note (Signed)
Unclear etiology. Plan for chest x-ray. Empiric treatment with albuterol codeine cough syrup Atrovent nasal spray and prednisone. Return if not better.

## 2015-03-15 NOTE — Patient Instructions (Signed)
Thank you for coming in today. Call or go to the emergency room if you get worse, have trouble breathing, have chest pains, or palpitations.     Acute Bronchitis Bronchitis is inflammation of the airways that extend from the windpipe into the lungs (bronchi). The inflammation often causes mucus to develop. This leads to a cough, which is the most common symptom of bronchitis.  In acute bronchitis, the condition usually develops suddenly and goes away over time, usually in a couple weeks. Smoking, allergies, and asthma can make bronchitis worse. Repeated episodes of bronchitis may cause further lung problems.  CAUSES Acute bronchitis is most often caused by the same virus that causes a cold. The virus can spread from person to person (contagious) through coughing, sneezing, and touching contaminated objects. SIGNS AND SYMPTOMS   Cough.   Fever.   Coughing up mucus.   Body aches.   Chest congestion.   Chills.   Shortness of breath.   Sore throat.  DIAGNOSIS  Acute bronchitis is usually diagnosed through a physical exam. Your health care provider will also ask you questions about your medical history. Tests, such as chest X-rays, are sometimes done to rule out other conditions.  TREATMENT  Acute bronchitis usually goes away in a couple weeks. Oftentimes, no medical treatment is necessary. Medicines are sometimes given for relief of fever or cough. Antibiotic medicines are usually not needed but may be prescribed in certain situations. In some cases, an inhaler may be recommended to help reduce shortness of breath and control the cough. A cool mist vaporizer may also be used to help thin bronchial secretions and make it easier to clear the chest.  HOME CARE INSTRUCTIONS  Get plenty of rest.   Drink enough fluids to keep your urine clear or pale yellow (unless you have a medical condition that requires fluid restriction). Increasing fluids may help thin your respiratory  secretions (sputum) and reduce chest congestion, and it will prevent dehydration.   Take medicines only as directed by your health care provider.  If you were prescribed an antibiotic medicine, finish it all even if you start to feel better.  Avoid smoking and secondhand smoke. Exposure to cigarette smoke or irritating chemicals will make bronchitis worse. If you are a smoker, consider using nicotine gum or skin patches to help control withdrawal symptoms. Quitting smoking will help your lungs heal faster.   Reduce the chances of another bout of acute bronchitis by washing your hands frequently, avoiding people with cold symptoms, and trying not to touch your hands to your mouth, nose, or eyes.   Keep all follow-up visits as directed by your health care provider.  SEEK MEDICAL CARE IF: Your symptoms do not improve after 1 week of treatment.  SEEK IMMEDIATE MEDICAL CARE IF:  You develop an increased fever or chills.   You have chest pain.   You have severe shortness of breath.  You have bloody sputum.   You develop dehydration.  You faint or repeatedly feel like you are going to pass out.  You develop repeated vomiting.  You develop a severe headache. MAKE SURE YOU:   Understand these instructions.  Will watch your condition.  Will get help right away if you are not doing well or get worse.   This information is not intended to replace advice given to you by your health care provider. Make sure you discuss any questions you have with your health care provider.   Document Released: 02/10/2004 Document Revised: 01/23/2014   Document Reviewed: 06/25/2012 Elsevier Interactive Patient Education 2016 [employer]  

## 2015-03-16 NOTE — Progress Notes (Signed)
Quick Note:  Chest xray shows some bronchitis. ______

## 2015-03-18 ENCOUNTER — Ambulatory Visit: Payer: BC Managed Care – PPO | Admitting: Obstetrics & Gynecology

## 2015-03-29 ENCOUNTER — Ambulatory Visit: Payer: BC Managed Care – PPO | Admitting: Obstetrics & Gynecology

## 2015-05-04 ENCOUNTER — Ambulatory Visit (INDEPENDENT_AMBULATORY_CARE_PROVIDER_SITE_OTHER): Payer: BC Managed Care – PPO | Admitting: Family Medicine

## 2015-05-04 ENCOUNTER — Encounter: Payer: Self-pay | Admitting: Family Medicine

## 2015-05-04 ENCOUNTER — Ambulatory Visit (INDEPENDENT_AMBULATORY_CARE_PROVIDER_SITE_OTHER): Payer: BC Managed Care – PPO

## 2015-05-04 VITALS — BP 137/88 | HR 81 | Wt 119.0 lb

## 2015-05-04 DIAGNOSIS — S2231XA Fracture of one rib, right side, initial encounter for closed fracture: Secondary | ICD-10-CM

## 2015-05-04 DIAGNOSIS — R0781 Pleurodynia: Secondary | ICD-10-CM

## 2015-05-04 DIAGNOSIS — X58XXXA Exposure to other specified factors, initial encounter: Secondary | ICD-10-CM

## 2015-05-04 NOTE — Assessment & Plan Note (Signed)
Rib fracture on preliminary x-ray. Plan for rib belt incentive spirometer and NSAIDs. Additionally check vitamin D CMP and bone density test. Recheck in about 4 weeks.

## 2015-05-04 NOTE — Patient Instructions (Signed)
Thank you for coming in today. Use the incentive spirometer every 30 minutes while awake.  Use the rib belt as needed.  Take ibuprofen or Aleve as needed for pain. Get bone density test and blood work soon [employer] in about 4 weeks. Return sooner if needed.   Rib Fracture A rib fracture is a break or crack in one of the bones of the ribs. The ribs are a group of long, curved bones that wrap around your chest and attach to your spine. They protect your lungs and other organs in the chest cavity. A broken or cracked rib is often painful, but most do not cause other problems. Most rib fractures heal on their own over time. However, rib fractures can be more serious if multiple ribs are broken or if broken ribs move out of place and push against other structures. CAUSES   A direct blow to the chest. For example, this could happen during contact sports, a car accident, or a fall against a hard object.  Repetitive movements with high force, such as pitching a baseball or having severe coughing spells. SYMPTOMS   Pain when you breathe in or cough.  Pain when someone presses on the injured area. DIAGNOSIS  Your caregiver will perform a physical exam. Various imaging tests may be ordered to confirm the diagnosis and to look for related injuries. These tests may include a chest X-ray, computed tomography (CT), magnetic resonance imaging (MRI), or a bone scan. TREATMENT  Rib fractures usually heal on their own in 1-3 months. The longer healing period is often associated with a continued cough or other aggravating activities. During the healing period, pain control is very important. Medication is usually given to control pain. Hospitalization or surgery may be needed for more severe injuries, such as those in which multiple ribs are broken or the ribs have moved out of place.  HOME CARE INSTRUCTIONS   Avoid strenuous activity and any activities or movements that cause pain. Be careful during  activities and avoid bumping the injured rib.  Gradually increase activity as directed by your caregiver.  Only take over-the-counter or prescription medications as directed by your caregiver. Do not take other medications without asking your caregiver first.  Apply ice to the injured area for the first 1-2 days after you have been treated or as directed by your caregiver. Applying ice helps to reduce inflammation and pain.  Put ice in a plastic bag.  Place a towel between your skin and the bag.   Leave the ice on for 15-20 minutes at a time, every 2 hours while you are awake.  Perform deep breathing as directed by your caregiver. This will help prevent pneumonia, which is a common complication of a broken rib. Your caregiver may instruct you to:  Take deep breaths several times a day.  Try to cough several times a day, holding a pillow against the injured area.  Use a device called an incentive spirometer to practice deep breathing several times a day.  Drink enough fluids to keep your urine clear or pale yellow. This will help you avoid constipation.   Do not wear a rib belt or binder. These restrict breathing, which can lead to pneumonia.  SEEK IMMEDIATE MEDICAL CARE IF:   You have a fever.   You have difficulty breathing or shortness of breath.   You develop a continual cough, or you cough up thick or bloody sputum.  You feel sick to your stomach (nausea), throw up (vomit),  or have abdominal pain.   You have worsening pain not controlled with medications.  MAKE SURE YOU:  Understand these instructions.  Will watch your condition.  Will get help right away if you are not doing well or get worse.   This information is not intended to replace advice given to you by your health care provider. Make sure you discuss any questions you have with your health care provider.   Document Released: 01/02/2005 Document Revised: 09/04/2012 Document Reviewed:  03/06/2012 Elsevier Interactive Patient Education [employer].

## 2015-05-04 NOTE — Progress Notes (Signed)
   Subjective:    I'm seeing this patient as a consultation for:  Dr. Madilyn Fireman  CC: right lateral rib pain  HPI: patient slipped and fell 8 days ago when not on her right side. She notes right lateral rib pain. The pain is worse with deep inspiration and cough. She feels crunching or moving when she expands her chest wall. She denies any fevers chills nausea vomiting or diarrhea.  Past medical history, Surgical history, Family history not pertinant except as noted below, Social history, Allergies, and medications have been entered into the medical record, reviewed, and no changes needed.   Review of Systems: No headache, visual changes, nausea, vomiting, diarrhea, constipation, dizziness, abdominal pain, skin rash, fevers, chills, night sweats, weight loss, swollen lymph nodes, body aches, joint swelling, muscle aches, chest pain, shortness of breath, mood changes, visual or auditory hallucinations.   Objective:    Filed Vitals:   05/04/15 1140  BP: 137/88  Pulse: 81   General: Well Developed, well nourished, and in no acute distress.  Neuro/Psych: Alert and oriented x3, extra-ocular muscles intact, able to move all 4 extremities, sensation grossly intact. Skin: Warm and dry, no rashes noted.  Respiratory: Not using accessory muscles, speaking in full sentences, trachea midline.  normal-appearing chest wall with symmetrical expansion. Normal lung sounds Cardiovascular: Pulses palpable, no extremity edema. Abdomen: Does not appear distended. MSK: . Tender to palpation lateral right ribs  X-ray right ribs appears to show a right lateral rib fracture. No pneumothorax or atelectasis visible. Awaiting formal radiology review  No results found for this or any previous visit (from the past 24 hour(s)). No results found.  Impression and Recommendations:   This case required medical decision making of moderate complexity.
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
## 2015-06-03 ENCOUNTER — Ambulatory Visit (INDEPENDENT_AMBULATORY_CARE_PROVIDER_SITE_OTHER): Payer: BC Managed Care – PPO | Admitting: Family Medicine

## 2015-06-03 DIAGNOSIS — Z5329 Procedure and treatment not carried out because of patient's decision for other reasons: Secondary | ICD-10-CM

## 2015-06-04 NOTE — Progress Notes (Signed)
No show. F/u PRN

## 2015-07-06 ENCOUNTER — Telehealth: Payer: Self-pay | Admitting: *Deleted

## 2015-07-06 DIAGNOSIS — N952 Postmenopausal atrophic vaginitis: Secondary | ICD-10-CM

## 2015-07-06 MED ORDER — ESTRADIOL 0.1 MG/GM VA CREA: TOPICAL_CREAM | VAGINAL | Status: DC

## 2015-07-06 NOTE — Telephone Encounter (Signed)
Pt called for RF on Estrace cream.  1 RF given and pt has appt with Dr Gala Romney 07/28/15.

## 2015-07-28 ENCOUNTER — Encounter: Payer: Self-pay | Admitting: Obstetrics & Gynecology

## 2015-07-28 ENCOUNTER — Ambulatory Visit (INDEPENDENT_AMBULATORY_CARE_PROVIDER_SITE_OTHER): Payer: BC Managed Care – PPO | Admitting: Obstetrics & Gynecology

## 2015-07-28 VITALS — BP 98/54 | HR 73 | Resp 16 | Ht 64.0 in | Wt 119.0 lb

## 2015-07-28 DIAGNOSIS — N898 Other specified noninflammatory disorders of vagina: Secondary | ICD-10-CM

## 2015-07-28 MED ORDER — ESTRADIOL 10 MCG VA TABS: ORAL_TABLET | VAGINAL | Status: DC

## 2015-07-29 LAB — WET PREP BY MOLECULAR PROBE
CANDIDA SPECIES: NEGATIVE
Gardnerella vaginalis: NEGATIVE
Trichomonas vaginosis: NEGATIVE

## 2015-07-30 ENCOUNTER — Encounter: Payer: Self-pay | Admitting: Obstetrics & Gynecology

## 2015-07-30 NOTE — Progress Notes (Signed)
   Subjective:    Patient ID: Shirley Moody, female    DOB: 1960/07/01, 55 y.o.   MRN: [id number]  HPI Pt present complaining of fullness in her vagina.  She Does not have any pain Location: vagina Quality: heaviness and pressure, sometimes like something is "falling out" Severity: mild Duration: a few weeks Timing: more with movement, coughing Modifying factors: none Associated signs and symptoms: none   Pt is seeing urologist for recurrent uti issues.  Wear pads daily.  Review of Systems  Constitutional: Negative.   Cardiovascular: Negative.   Gastrointestinal: Negative.   Genitourinary:       +leaking urine +urine stream still is erratic    Objective:   Physical Exam  Constitutional: She is oriented to person, place, and time. She appears well-developed and well-nourished. No distress.  HENT:  Head: Normocephalic and atraumatic.  Cardiovascular: Normal rate.   Pulmonary/Chest: Effort normal.  Abdominal: Soft. She exhibits no distension. There is no tenderness.  Genitourinary:  Vuvla:  Tanner 5, no lesion Introitus:  Pale pink, mildly tender on speculum insertion Bladder:  Mild cystocele with valsalva Cervix:   Almost flush with vagina, no lesion, atrophic, no descensus with valsalva Rectocele:  none  Musculoskeletal: She exhibits no edema.  Neurological: She is alert and oriented to person, place, and time.  Skin: Skin is warm and dry.  Psychiatric: She has a normal mood and affect.  Vitals reviewed.         Assessment & Plan:  55 yo female with atrohpic vaginitis and mild cystocele  1-kegal exerciex 2-change to vagifem for better insertion and less mess 3-pt reassured that there is not severe POP and no intervention needed to be done. 4-bd affirm sent.

## 2015-08-03 ENCOUNTER — Telehealth: Payer: Self-pay | Admitting: *Deleted

## 2015-08-03 NOTE — Telephone Encounter (Signed)
Pt notified of negative wet prep results

## 2015-08-03 NOTE — Telephone Encounter (Signed)
-----   Message from Guss Bunde, MD sent at 07/30/2015  6:33 AM EDT ----- Normal wet prep.

## 2015-08-10 ENCOUNTER — Telehealth: Payer: Self-pay | Admitting: *Deleted

## 2015-08-10 NOTE — Telephone Encounter (Signed)
-----   Message from Guss Bunde, MD sent at 08/06/2015  6:01 AM EDT ----- Pt did not pick up my chart message.  Please call her with wet prep results.

## 2015-08-10 NOTE — Telephone Encounter (Signed)
Pt notified of normal wet prep results

## 2015-08-27 ENCOUNTER — Other Ambulatory Visit: Payer: Self-pay | Admitting: Family Medicine

## 2015-10-13 ENCOUNTER — Emergency Department (INDEPENDENT_AMBULATORY_CARE_PROVIDER_SITE_OTHER): Payer: BC Managed Care – PPO

## 2015-10-13 ENCOUNTER — Emergency Department
Admission: EM | Admit: 2015-10-13 | Discharge: 2015-10-13 | Disposition: A | Discharge status: Home / Self Care | Payer: BC Managed Care – PPO | Source: Home / Self Care | Attending: Emergency Medicine | Admitting: Emergency Medicine

## 2015-10-13 ENCOUNTER — Encounter: Payer: Self-pay | Admitting: Emergency Medicine

## 2015-10-13 DIAGNOSIS — R0781 Pleurodynia: Secondary | ICD-10-CM

## 2015-10-13 DIAGNOSIS — S20221A Contusion of right back wall of thorax, initial encounter: Secondary | ICD-10-CM

## 2015-10-13 MED ORDER — IBUPROFEN 800 MG PO TABS: 800.0000 mg | ORAL_TABLET | Freq: Three times a day (TID) | ORAL | 0 refills | Status: DC

## 2015-10-13 MED ORDER — HYDROCODONE-ACETAMINOPHEN 5-325 MG PO TABS: 2.0000 | ORAL_TABLET | ORAL | 0 refills | Status: DC | PRN
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
## 2015-10-14 ENCOUNTER — Telehealth: Payer: Self-pay | Admitting: *Deleted
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
## 2015-10-14 NOTE — Telephone Encounter (Signed)
lvm informing pt that the form that she dropped off to be completed and faxed cannot be completed due to her needing to be seen for a physical and to have a ppd updated. Advised that she call back and make an appt to have this done since she has did not have a CPE this year that would allow me to complete this form for her.Shirley Moody [place]

## 2015-11-04 ENCOUNTER — Encounter (HOSPITAL_BASED_OUTPATIENT_CLINIC_OR_DEPARTMENT_OTHER): Payer: Self-pay | Admitting: *Deleted

## 2015-11-04 ENCOUNTER — Emergency Department (HOSPITAL_BASED_OUTPATIENT_CLINIC_OR_DEPARTMENT_OTHER)
Admission: EM | Admit: 2015-11-04 | Discharge: 2015-11-04 | Disposition: A | Discharge status: Home / Self Care | Payer: Worker's Compensation | Attending: Emergency Medicine | Admitting: Emergency Medicine

## 2015-11-04 ENCOUNTER — Emergency Department (HOSPITAL_BASED_OUTPATIENT_CLINIC_OR_DEPARTMENT_OTHER): Payer: Worker's Compensation

## 2015-11-04 DIAGNOSIS — Y939 Activity, unspecified: Secondary | ICD-10-CM | POA: Diagnosis not present

## 2015-11-04 DIAGNOSIS — S41052A Open bite of left shoulder, initial encounter: Secondary | ICD-10-CM | POA: Diagnosis present

## 2015-11-04 DIAGNOSIS — Y929 Unspecified place or not applicable: Secondary | ICD-10-CM | POA: Diagnosis not present

## 2015-11-04 DIAGNOSIS — W503XXA Accidental bite by another person, initial encounter: Secondary | ICD-10-CM | POA: Diagnosis not present

## 2015-11-04 DIAGNOSIS — S161XXA Strain of muscle, fascia and tendon at neck level, initial encounter: Secondary | ICD-10-CM | POA: Insufficient documentation

## 2015-11-04 DIAGNOSIS — Z87891 Personal history of nicotine dependence: Secondary | ICD-10-CM | POA: Diagnosis not present

## 2015-11-04 DIAGNOSIS — Y999 Unspecified external cause status: Secondary | ICD-10-CM | POA: Diagnosis not present

## 2015-11-04 DIAGNOSIS — S41151A Open bite of right upper arm, initial encounter: Secondary | ICD-10-CM | POA: Diagnosis not present

## 2015-11-04 MED ORDER — DOXYCYCLINE HYCLATE 100 MG PO CAPS: 100.0000 mg | ORAL_CAPSULE | Freq: Two times a day (BID) | ORAL | 0 refills | Status: DC

## 2015-11-04 NOTE — Discharge Instructions (Signed)
Please take the antibiotics for 4 days. You may use heat and tylenol for neck pain as needed. Follow up with your pcp concerning your xray finding.
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
## 2015-11-08 ENCOUNTER — Ambulatory Visit (INDEPENDENT_AMBULATORY_CARE_PROVIDER_SITE_OTHER): Payer: BC Managed Care – PPO | Admitting: Family Medicine

## 2015-11-08 ENCOUNTER — Encounter: Payer: Self-pay | Admitting: Family Medicine

## 2015-11-08 VITALS — BP 131/69 | HR 78 | Wt 122.0 lb

## 2015-11-08 DIAGNOSIS — T148XXA Other injury of unspecified body region, initial encounter: Secondary | ICD-10-CM

## 2015-11-08 DIAGNOSIS — L820 Inflamed seborrheic keratosis: Secondary | ICD-10-CM

## 2015-11-08 DIAGNOSIS — N3000 Acute cystitis without hematuria: Secondary | ICD-10-CM

## 2015-11-08 DIAGNOSIS — Z0000 Encounter for general adult medical examination without abnormal findings: Secondary | ICD-10-CM

## 2015-11-08 DIAGNOSIS — R300 Dysuria: Secondary | ICD-10-CM

## 2015-11-08 DIAGNOSIS — Z1159 Encounter for screening for other viral diseases: Secondary | ICD-10-CM

## 2015-11-08 LAB — POCT URINALYSIS DIPSTICK
Bilirubin, UA: NEGATIVE
Blood, UA: NEGATIVE
Glucose, UA: NEGATIVE
Ketones, UA: NEGATIVE
NITRITE UA: NEGATIVE
PH UA: 7.5
PROTEIN UA: NEGATIVE
Spec Grav, UA: 1.01
Urobilinogen, UA: 0.2

## 2015-11-08 MED ORDER — CIPROFLOXACIN HCL 500 MG PO TABS: 500.0000 mg | ORAL_TABLET | Freq: Two times a day (BID) | ORAL | 0 refills | Status: AC

## 2015-11-08 NOTE — Progress Notes (Addendum)
Subjective:     Shirley Moody is a 55 y.o. female and is here for a comprehensive physical exam. The patient reports problems - skin lesion on her back and her right forehead she would like removed.    She also complains of dysuria 3 days. She does little burning with urination. No fevers chills or sweats or back pain. No hematuria. Not taking any medications.  Social History   Social History  . Marital status: Single    Spouse name: N/A  . Number of children: N/A  . Years of education: N/A   Occupational History  . 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 Smoking status: Former Research scientist (life sciences)  . Smokeless tobacco: Never Used  . Alcohol use 0.0 oz/week     Comment: 1 drink/month  . Drug use: No  . Sexual activity: Yes    Partners: Male   Other Topics Concern  . Not on file   Social History Narrative  . No narrative on file   Health Maintenance  Topic Date Due  . Hepatitis C Screening  March 28, 1960  . HIV Screening  04/07/1976  . MAMMOGRAM  02/03/2017  . PAP SMEAR  01/31/2018  . COLONOSCOPY  01/28/2019  . TETANUS/TDAP  08/29/2022  . INFLUENZA VACCINE  Completed    The following portions of the patient's history were reviewed and updated as appropriate: allergies, current medications, past family history, past medical history, past social history, past surgical history and problem list.  Review of Systems A comprehensive review of systems was negative.   Objective:    BP 131/69   Pulse 78   Wt 122 lb (55.3 kg)   LMP 04/05/2011   SpO2 99%   BMI 20.94 kg/m  General appearance: alert, cooperative and appears stated age Head: Normocephalic, without obvious abnormality, atraumatic Eyes: conj clear, EOMi, pEERLA Ears: normal TM's and external ear canals both ears Nose: Nares normal. Septum midline. Mucosa normal. No drainage or sinus tenderness. Throat: lips, mucosa, and tongue normal; teeth and gums normal Neck: no  adenopathy, no carotid bruit, no JVD, supple, symmetrical, trachea midline and thyroid not enlarged, symmetric, no tenderness/mass/nodules Back: symmetric, no curvature. ROM normal. No CVA tenderness. Lungs: clear to auscultation bilaterally Heart: regular rate and rhythm, S1, S2 normal, no murmur, click, rub or gallop Abdomen: soft, non-tender; bowel sounds normal; no masses,  no organomegaly Extremities: extremities normal, atraumatic, no cyanosis or edema Pulses: 2+ and symmetric Skin: Skin color, texture, turgor normal. No rashes or lesions or she has a wart like pink mole on the left mid back and a 0.5 cm smalle light brown seborrheaic keratosis Lymph nodes: Cervical adenopathy: nl and Axillary adenopathy: nl Neurologic: Alert and oriented X 3, normal strength and tone. Normal symmetric reflexes. Normal coordination and gait    Assessment:    Healthy female exam.      Plan:     See After Visit Summary for Counseling Recommendations   Keep up a regular exercise program and make sure you are eating a healthy diet Try to eat 4 servings of dairy a day, or if you are lactose intolerant take a calcium with vitamin D daily.  Your vaccines are up to date.  Form completed for [employer] [facility] [employer] [employer] system. Due for labs as well.   UTI - urinalysis only positive for leukocytes but we'll go ahead and treat based on symptoms. We'll send her prescription for Cipro 5 mg twice a day 3  days.  Cryotherapy Procedure Note  Pre-operative Diagnosis: Seborrheic keratosis  Post-operative Diagnosis: same   Locations: Left mid back and right forehead  Indications: irritation  Anesthesia: not required    Procedure Details  Patient informed of risks (permanent scarring, infection, light or dark discoloration, bleeding, infection, weakness, numbness and recurrence of the lesion) and benefits of the procedure and verbal informed consent obtained.  The areas are treated with liquid nitrogen therapy, frozen  until ice ball extended 1-2 mm beyond lesion, allowed to thaw, and treated again. The patient tolerated procedure well.  The patient was instructed on post-op care, warned that there may be blister formation, redness and pain. Recommend OTC analgesia as needed for pain.  Condition: Stable  Complications: none.  Plan: 1. Instructed to keep the area dry and covered for 24-48h and clean thereafter. 2. Warning signs of infection were reviewed.   3. Recommended that the patient use OTC acetaminophen as needed for pain.  4. Return PRN.

## 2015-11-08 NOTE — Patient Instructions (Addendum)
Keep up a regular exercise program and make sure you are eating a healthy diet Try to eat 4 servings of dairy a day, or if you are lactose intolerant take a calcium with vitamin D daily.  Your vaccines are up to date.     Urinary Tract Infection Urinary tract infections (UTIs) can develop anywhere along your urinary tract. Your urinary tract is your body's drainage system for removing wastes and extra water. Your urinary tract includes two kidneys, two ureters, a bladder, and a urethra. Your kidneys are a pair of bean-shaped organs. Each kidney is about the size of your fist. They are located below your ribs, one on each side of your spine. CAUSES Infections are caused by microbes, which are microscopic organisms, including fungi, viruses, and bacteria. These organisms are so small that they can only be seen through a microscope. Bacteria are the microbes that most commonly cause UTIs. SYMPTOMS  Symptoms of UTIs may vary by age and gender of the patient and by the location of the infection. Symptoms in young women typically include a frequent and intense urge to urinate and a painful, burning feeling in the bladder or urethra during urination. Older women and men are more likely to be tired, shaky, and weak and have muscle aches and abdominal pain. A fever may mean the infection is in your kidneys. Other symptoms of a kidney infection include pain in your back or sides below the ribs, nausea, and vomiting. DIAGNOSIS To diagnose a UTI, your caregiver will ask you about your symptoms. Your caregiver will also ask you to provide a urine sample. The urine sample will be tested for bacteria and white blood cells. White blood cells are made by your body to help fight infection. TREATMENT  Typically, UTIs can be treated with medication. Because most UTIs are caused by a bacterial infection, they usually can be treated with the use of antibiotics. The choice of antibiotic and length of treatment depend on  your symptoms and the type of bacteria causing your infection. HOME CARE INSTRUCTIONS  If you were prescribed antibiotics, take them exactly as your caregiver instructs you. Finish the medication even if you feel better after you have only taken some of the medication.  Drink enough water and fluids to keep your urine clear or pale yellow.  Avoid caffeine, tea, and carbonated beverages. They tend to irritate your bladder.  Empty your bladder often. Avoid holding urine for long periods of time.  Empty your bladder before and after sexual intercourse.  After a bowel movement, women should cleanse from front to back. Use each tissue only once. SEEK MEDICAL CARE IF:   You have back pain.  You develop a fever.  Your symptoms do not begin to resolve within 3 days. SEEK IMMEDIATE MEDICAL CARE IF:   You have severe back pain or lower abdominal pain.  You develop chills.  You have nausea or vomiting.  You have continued burning or discomfort with urination. MAKE SURE YOU:   Understand these instructions.  Will watch your condition.  Will get help right away if you are not doing well or get worse.   This information is not intended to replace advice given to you by your health care provider. Make sure you discuss any questions you have with your health care provider.   Document Released: 10/12/2004 Document Revised: 09/23/2014 Document Reviewed: 02/10/2011 Elsevier Interactive Patient Education [employer].

## 2015-11-09 LAB — COMPLETE METABOLIC PANEL WITH GFR
ALT: 24 U/L (ref 6–29)
AST: 22 U/L (ref 10–35)
Albumin: 4.1 g/dL (ref 3.6–5.1)
Alkaline Phosphatase: 46 U/L (ref 33–130)
BILIRUBIN TOTAL: 0.3 mg/dL (ref 0.2–1.2)
BUN: 13 mg/dL (ref 7–25)
CALCIUM: 9.5 mg/dL (ref 8.6–10.4)
CO2: 27 mmol/L (ref 20–31)
Chloride: 105 mmol/L (ref 98–110)
Creat: 0.81 mg/dL (ref 0.50–1.05)
GFR, Est Non African American: 83 mL/min (ref >=60)
Glucose, Bld: 92 mg/dL (ref 65–99)
Potassium: 4.5 mmol/L (ref 3.5–5.3)
Sodium: 141 mmol/L (ref 135–146)
TOTAL PROTEIN: 6.2 g/dL (ref 6.1–8.1)

## 2015-11-09 LAB — LIPID PANEL
CHOLESTEROL: 160 mg/dL (ref 125–200)
HDL: 56 mg/dL (ref >=46)
LDL CALC: 90 mg/dL (ref <130)
TRIGLYCERIDES: 72 mg/dL (ref <150)
Total CHOL/HDL Ratio: 2.9 Ratio (ref <=5.0)
VLDL: 14 mg/dL (ref <30)

## 2015-11-10 LAB — HEPATITIS C ANTIBODY: HCV Ab: NEGATIVE

## 2015-11-17 ENCOUNTER — Ambulatory Visit (INDEPENDENT_AMBULATORY_CARE_PROVIDER_SITE_OTHER): Payer: BC Managed Care – PPO

## 2015-11-17 ENCOUNTER — Encounter: Payer: Self-pay | Admitting: Family Medicine

## 2015-11-17 DIAGNOSIS — M810 Age-related osteoporosis without current pathological fracture: Secondary | ICD-10-CM

## 2015-11-17 HISTORY — DX: Age-related osteoporosis without current pathological fracture: M81.0

## 2015-12-02 ENCOUNTER — Encounter: Payer: Self-pay | Admitting: Osteopathic Medicine

## 2015-12-02 ENCOUNTER — Ambulatory Visit (INDEPENDENT_AMBULATORY_CARE_PROVIDER_SITE_OTHER): Payer: BC Managed Care – PPO | Admitting: Osteopathic Medicine

## 2015-12-02 VITALS — BP 113/65 | HR 62 | Ht 63.5 in | Wt 120.0 lb

## 2015-12-02 DIAGNOSIS — K5732 Diverticulitis of large intestine without perforation or abscess without bleeding: Secondary | ICD-10-CM

## 2015-12-02 MED ORDER — ALENDRONATE SODIUM 70 MG PO TABS: 70.0000 mg | ORAL_TABLET | ORAL | 3 refills | Status: DC

## 2015-12-02 MED ORDER — CIPROFLOXACIN HCL 750 MG PO TABS: 750.0000 mg | ORAL_TABLET | Freq: Two times a day (BID) | ORAL | 0 refills | Status: AC

## 2015-12-02 MED ORDER — METRONIDAZOLE 500 MG PO TABS: 500.0000 mg | ORAL_TABLET | Freq: Three times a day (TID) | ORAL | 0 refills | Status: DC

## 2015-12-02 NOTE — Patient Instructions (Addendum)
Diverticulitis Diverticulitis is inflammation or infection of small pouches in your colon that form when you have a condition called diverticulosis. The pouches in your colon are called diverticula. Your colon, or large intestine, is where water is absorbed and stool is formed. Complications of diverticulitis can include:  Bleeding.  Severe infection.  Severe pain.  Perforation of your colon.  Obstruction of your colon. What are the causes? Diverticulitis is caused by bacteria. Diverticulitis happens when stool becomes trapped in diverticula. This allows bacteria to grow in the diverticula, which can lead to inflammation and infection. What increases the risk? People with diverticulosis are at risk for diverticulitis. Eating a diet that does not include enough fiber from fruits and vegetables may make diverticulitis more likely to develop. What are the signs or symptoms? Symptoms of diverticulitis may include:  Abdominal pain and tenderness. The pain is normally located on the left side of the abdomen, but may occur in other areas.  Fever and chills.  Bloating.  Cramping.  Nausea.  Vomiting.  Constipation.  Diarrhea.  Blood in your stool. How is this diagnosed? Your health care provider will ask you about your medical history and do a physical exam. You may need to have tests done because many medical conditions can cause the same symptoms as diverticulitis. Tests may include:  Blood tests.  Urine tests.  Imaging tests of the abdomen, including X-rays and CT scans. When your condition is under control, your health care provider may recommend that you have a colonoscopy. A colonoscopy can show how severe your diverticula are and whether something else is causing your symptoms. How is this treated? Most cases of diverticulitis are mild and can be treated at home. Treatment may include:  Taking over-the-counter pain medicines.  Following a clear liquid diet.  Taking  antibiotic medicines by mouth for 7-10 days. More severe cases may be treated at a hospital. Treatment may include:  Not eating or drinking.  Taking prescription pain medicine.  Receiving antibiotic medicines through an IV tube.  Receiving fluids and nutrition through an IV tube.  Surgery. Follow these instructions at home:  Follow your health care provider's instructions carefully.  Follow a full liquid diet or other diet as directed by your health care provider. After your symptoms improve, your health care provider may tell you to change your diet. He or she may recommend you eat a high-fiber diet. Fruits and vegetables are good sources of fiber. Fiber makes it easier to pass stool.  Take fiber supplements or probiotics as directed by your health care provider.  Only take medicines as directed by your health care provider.  Keep all your follow-up appointments. Contact a health care provider if:  Your pain does not improve.  You have a hard time eating food.  Your bowel movements do not return to normal. Get help right away if:  Your pain becomes worse.  Your symptoms do not get better.  Your symptoms suddenly get worse.  You have a fever.  You have repeated vomiting.  You have bloody or black, tarry stools. This information is not intended to replace advice given to you by your health care provider. Make sure you discuss any questions you have with your health care provider. Document Released: 10/12/2004 Document Revised: 06/10/2015 Document Reviewed: 11/27/2012 Elsevier Interactive Patient Education  2017 Elsevier Inc.   Full Liquid Diet A full liquid diet may be used:  To help you transition from a clear liquid diet to a soft diet.  When your body is healing and can only tolerate foods that are easy to digest.  Before or after certain a procedure, test, or surgery (such as stomach or intestinal surgeries).  If you have trouble swallowing or chewing. A  full liquid diet includes fluids and foods that are liquid or will become liquid at room temperature. The full liquid diet gives you the proteins, fluids, salts, and minerals that you need for energy. If you continue this diet for more than 72 hours, talk to your health care provider about how many calories you need to consume. If you continue the diet for more than 5 days, talk to your health care provider about taking a multivitamin or a nutritional supplement. What do I need to know about a full liquid diet?  You may have any liquid.  You may have any food that becomes a liquid at room temperature. The food is considered a liquid if it can be poured off a spoon at room temperature.  Drink one serving of citrus or vitamin C-enriched fruit juice daily. What foods can I eat? Grains  Any grain food that can be pureed in soup (such as crackers, pasta, and rice). Hot cereal (such as farina or oatmeal) that has been blended. Talk to your health care provider or dietitian about these foods. Vegetables  Pulp-free tomato or vegetable juice. Vegetables pureed in soup. Fruits  Fruit juice, including nectars and juices with pulp. Meats and Other Protein Sources  Eggs in custard, eggnog mix, and eggs used in ice cream or pudding. Strained meats, like in baby food, may be allowed. Consult your health care provider. Dairy  Milk and milk-based beverages, including milk shakes and instant breakfast mixes. Smooth yogurt. Pureed cottage cheese. Avoid these foods if they are not well tolerated. Beverages  All beverages, including liquid nutritional supplements. Ask your health care provider if you can have carbonated beverages. They may not be well tolerated. Condiments  Iodized salt, pepper, spices, and flavorings. Cocoa powder. Vinegar, ketchup, yellow mustard, smooth sauces (such as hollandaise, cheese sauce, or white sauce), and soy sauce. Sweets and Desserts  Custard, smooth pudding. Flavored gelatin.  Tapioca, junket. Plain ice cream, sherbet, fruit ices. Frozen ice pops, frozen fudge pops, pudding pops, and other frozen bars with cream. Syrups, including chocolate syrup. Sugar, honey, jelly. Fats and Oils  Margarine, butter, cream, sour cream, and oils. Other  Broth and cream soups. Strained, broth-based soups. The items listed above may not be a complete list of recommended foods or beverages. Contact your dietitian for more options.  What foods can I not eat? Grains  All breads. Grains are not allowed unless they are pureed into soup. Vegetables  Vegetables are not allowed unless they are juiced, or cooked and pureed into soup. Fruits  Fruits are not allowed unless they are juiced. Meats and Other Protein Sources  Any meat or fish. Cooked or raw eggs. Nut butters. Dairy  Cheese. Condiments  Stone ground mustards. Fats and Oils  Fats that are coarse or chunky. Sweets and Desserts  Ice cream or other frozen desserts that have any solids in them or on top, such as nuts, chocolate chips, and pieces of cookies. Cakes. Cookies. Candy. Others  Soups with chunks or pieces in them. The items listed above may not be a complete list of foods and beverages to avoid. Contact your dietitian for more information.  This information is not intended to replace advice given to you by your health care provider. Make  sure you discuss any questions you have with your health care provider. Document Released: 01/02/2005 Document Revised: 06/10/2015 Document Reviewed: 11/07/2012 Elsevier Interactive Patient Education  2017 [employer].

## 2015-12-02 NOTE — Progress Notes (Signed)
HPI: Shirley Moody is a 55 y.o. female  who presents to [facility] today, 12/02/15,  for chief complaint of:  Chief Complaint  Patient presents with  . Diverticulitis    Abd pain . Context: previous episode of diverticulitis, similar pain now . Location: LLQ and radiating to suprapubic  . Quality: sharp, cramping . Duration: 3-4 days . Assoc signs/symptoms: no fever, no N/V    Past medical, surgical, social and family history reviewed: Patient Active Problem List   Diagnosis Date Noted  . Osteoporosis 11/17/2015  . Postmenopausal atrophic vaginitis 02/01/2015  . Vulvar lesion 02/01/2015  . Primary osteoarthritis of left knee 06/26/2014  . Impingement syndrome of right shoulder 03/11/2013  . 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 Pain at left costal margin. 11/15/2011  . ADHD (attention deficit hyperactivity disorder) 12/05/2010  . GENERALIZED ANXIETY DISORDER 02/16/2010  . PAP SMEAR, LGSIL, ABNORMAL 02/16/2010  . DIVERTICULITIS, HX OF 01/22/2010  . ALLERGIC RHINITIS 11/16/2009  . COLONIC POLYPS, HYPERPLASTIC 01/27/2009  . DEPRESSION 02/01/2008   Past Surgical History:  Procedure Laterality Date  . DILATION AND CURETTAGE OF UTERUS    . NEUROMA SURGERY     Right foot   Social History  Substance Use Topics  . Smoking status: Former Research scientist (life sciences)  . Smokeless tobacco: Never Used  . Alcohol use 0.0 oz/week     Comment: 1 drink/month   Family History  Problem Relation Age of Onset  . Alcohol abuse Mother   . COPD Mother   . Hypertension Father   . Other Father 30    AMI/ CVA at 32  . Stroke Father   . Colon cancer Father      Current medication list and allergy/intolerance information reviewed:   Current Outpatient Prescriptions on File Prior to Visit  Medication Sig Dispense Refill  . buPROPion (WELLBUTRIN XL) 150 MG 24 hr tablet Take 450 mg by mouth daily.     . cetirizine (ZYRTEC) 10 MG tablet Take 10 mg by mouth daily.      Marland Kitchen  FLUoxetine (PROZAC) 40 MG capsule TAKE 1 CAPSULE BY MOUTH EVERY DAY 90 capsule 0  . ibuprofen (ADVIL,MOTRIN) 800 MG tablet Take 1 tablet (800 mg total) by mouth 3 (three) times daily. 21 tablet 0  . ipratropium (ATROVENT) 0.06 % nasal spray Place 2 sprays into both nostrils every 4 (four) hours as needed for rhinitis. 10 mL 6  . mometasone (NASONEX) 50 MCG/ACT nasal spray SHAKE LIQUID AND USE 1 SPRAY IN EACH NOSTRIL EVERY DAY AS DIRECTED 17 g 0  . traZODone (DESYREL) 150 MG tablet TAKE 1 TABLET BY MOUTH EVERY NIGHT AT BEDTIME 90 tablet 0  . VYVANSE 30 MG capsule TK ONE C PO QAM  0   No current facility-administered medications on file prior to visit.    Allergies  Allergen Reactions  . Biaxin [Clarithromycin]   . Concerta [Methylphenidate] Other (See Comments)    Says affected her sleep, made her jittery and would suck in her lips  . Penicillins       Review of Systems:  Constitutional: No recent illness  HEENT: No  headache, no vision change  Cardiac: No  chest pain, No  pressure, No palpitations  Respiratory:  No  shortness of breath. No  Cough  Gastrointestinal: +abdominal pain, no change on bowel habits, no bloody stool  Musculoskeletal: No new myalgia/arthralgia  Skin: No  Rash  Exam:  BP 113/65   Pulse 62   Ht 5'  3.5" (1.613 m)   Wt 120 lb (54.4 kg)   LMP 04/05/2011   BMI 20.92 kg/m   Constitutional: VS see above. General Appearance: alert, well-developed, well-nourished, NAD  Eyes: Normal lids and conjunctive, non-icteric sclera  Ears, Nose, Mouth, Throat: MMM, Normal external inspection ears/nares/mouth/lips/gums.  Neck: No masses, trachea midline.   Respiratory: Normal respiratory effort  Cardiovascular: No LE edema  Musculoskeletal: Gait normal. Symmetric and independent movement of all extremities  GI: (+)TTP LLQ, voluntary guarding, no rebound, no mass, normal to percussion. Rectal exam deferred.   Neurological: Normal balance/coordination. No  tremor.  Skin: warm, dry, intact.   Psychiatric: Normal judgment/insight. Normal mood and affect. Oriented x3.      ASSESSMENT/PLAN: Instructions printed for diverticulitis and full liquid diet. Similar sx in the past, but if change/worsen/persist will want to get imaging.   Diverticulitis of colon - Plan: metroNIDAZOLE (FLAGYL) 500 MG tablet, ciprofloxacin (CIPRO) 750 MG tablet      Visit summary with medication list and pertinent instructions was printed for patient to review. All questions at time of visit were answered - patient instructed to contact office with any additional concerns. ER/RTC precautions were reviewed with the patient. Follow-up plan: Return if symptoms worsen or fail to improve.

## 2015-12-02 NOTE — Addendum Note (Signed)
Addended by: Beatrice Lecher D on: 12/02/2015 08:38 AM   Modules accepted: Orders

## 2015-12-23 ENCOUNTER — Other Ambulatory Visit: Payer: Self-pay | Admitting: Family Medicine

## 2016-03-06 ENCOUNTER — Other Ambulatory Visit: Payer: Self-pay | Admitting: Family Medicine

## 2016-03-06 DIAGNOSIS — Z1231 Encounter for screening mammogram for malignant neoplasm of breast: Secondary | ICD-10-CM

## 2016-03-13 ENCOUNTER — Ambulatory Visit
Admission: RE | Admit: 2016-03-13 | Discharge: 2016-03-13 | Disposition: A | Discharge status: Home / Self Care | Payer: BC Managed Care – PPO | Source: Ambulatory Visit | Attending: Family Medicine | Admitting: Family Medicine

## 2016-03-13 DIAGNOSIS — Z1231 Encounter for screening mammogram for malignant neoplasm of breast: Secondary | ICD-10-CM

## 2016-03-20 ENCOUNTER — Encounter: Payer: Self-pay | Admitting: Obstetrics & Gynecology

## 2016-03-20 ENCOUNTER — Ambulatory Visit (INDEPENDENT_AMBULATORY_CARE_PROVIDER_SITE_OTHER): Payer: BC Managed Care – PPO | Admitting: Obstetrics & Gynecology

## 2016-03-20 VITALS — BP 120/77 | HR 66 | Ht 64.0 in | Wt 120.0 lb

## 2016-03-20 DIAGNOSIS — N898 Other specified noninflammatory disorders of vagina: Secondary | ICD-10-CM | POA: Diagnosis not present

## 2016-03-20 DIAGNOSIS — R3913 Splitting of urinary stream: Secondary | ICD-10-CM

## 2016-03-20 DIAGNOSIS — R413 Other amnesia: Secondary | ICD-10-CM | POA: Diagnosis not present

## 2016-03-20 DIAGNOSIS — N952 Postmenopausal atrophic vaginitis: Secondary | ICD-10-CM

## 2016-03-20 DIAGNOSIS — N9089 Other specified noninflammatory disorders of vulva and perineum: Secondary | ICD-10-CM

## 2016-03-20 MED ORDER — ESTRADIOL 10 MCG VA TABS: ORAL_TABLET | VAGINAL | 12 refills | Status: DC

## 2016-03-20 NOTE — Assessment & Plan Note (Signed)
Vulvar lesion present on the Left interior fold. Is not causing her problems currently but would like it removed. Will bring back to clinic for removal.

## 2016-03-20 NOTE — Assessment & Plan Note (Signed)
Estrace and reevaluation

## 2016-03-20 NOTE — Progress Notes (Addendum)
   Subjective:    Patient ID: Shirley Moody, female    DOB: [birth year], 56 y.o.   MRN: [id number]  HPI Pt present complaining of vaginal burning, vaginal bump, and erratic stream when voiding. She Does not have any pain, discharge, or abnormal bleeding. Pt has had these complaints in the past.  She did not go to the urologist as suggested and she stopped the vagifem.  She doesn't remember being told to continue the vagifem as needed for urinary and vaginal symptoms.  Pt says burning occurs when the rine comes out / when it hits the skin around urethra.  Pt has not had any other treatment other than above.    Pt has recurrence of sebaceous cyst on left labia.  In 2014, it was lanced and cheesy material was expressed.  There was not pathology to send.  Pt says bump bothers her still.  She is not sexually active but still lives with partner of 62 years--Shirley Moody.    Pt c/o memory problems.  She is on Trazadone and Vyvanse.  She denies alcohol, drugs, sleeping pills.  She is very worried about her cognitive changes.  Pt requesting a mental screening exam.  Will refer to Dr. Joellyn Quails for this.      Review of Systems  Constitutional: Negative.   Respiratory: Negative.   Cardiovascular: Negative.   Gastrointestinal: Negative for abdominal pain, diarrhea and nausea.  Genitourinary:       Urine stream is erratic in nature  Psychiatric/Behavioral: Positive for confusion.       Does have problems recalling things.        Objective:   Physical Exam  Constitutional: She is oriented to person, place, and time. She appears well-developed and well-nourished. No distress.  HENT:  Head: Normocephalic.  Genitourinary:  Genitourinary Comments: erythematous and very atropic in presentation with cervix flushed with the vagina.   Musculoskeletal: Normal range of motion.  Neurological: She is alert and oriented to person, place, and time.  Skin: Skin is warm and dry.  Psychiatric:  Unable to recall  previous conversation about health. States she is having trouble with memory.  Flat with pressure responds.  Lack of expression in face during communication.    Vitals:   03/20/16 1544  BP: 120/77  Pulse: 66  Weight: 120 lb (54.4 kg)  Height: 5\' 4"  (1.626 m)        Assessment & Plan:  56 yo female with multiple complaints, some worsening.    Postmenopausal atropic vaginitis Estrace and reevaluation.  Pt instructed to use nightly for 2 weeks then twice a week.  May need to continue this for several months and periodically for symptoms.    Vulvar lesion Vulvar lesion present on the Left interior fold. Is not causing her problems currently but would like it removed. Will bring back to clinic for removal.  Urinary stream splitting Referral to urologist to make sure there is no urethra mass or abnormality causing split and spray of urinary stream.  Memory problem Referral to PCP for memory testing

## 2016-03-21 ENCOUNTER — Telehealth: Payer: Self-pay | Admitting: Family Medicine

## 2016-03-21 NOTE — Telephone Encounter (Signed)
Please call patient and see if she would like to be seen for memory problems. I got a note from the OB/GYN.  Beatrice Lecher, MD

## 2016-03-22 ENCOUNTER — Emergency Department
Admission: EM | Admit: 2016-03-22 | Discharge: 2016-03-22 | Disposition: A | Discharge status: Home / Self Care | Payer: BC Managed Care – PPO | Source: Home / Self Care | Attending: Family Medicine | Admitting: Family Medicine

## 2016-03-22 ENCOUNTER — Emergency Department (INDEPENDENT_AMBULATORY_CARE_PROVIDER_SITE_OTHER): Payer: BC Managed Care – PPO

## 2016-03-22 ENCOUNTER — Encounter: Payer: Self-pay | Admitting: *Deleted

## 2016-03-22 DIAGNOSIS — M79671 Pain in right foot: Secondary | ICD-10-CM | POA: Diagnosis not present

## 2016-03-22 DIAGNOSIS — M778 Other enthesopathies, not elsewhere classified: Secondary | ICD-10-CM

## 2016-03-22 DIAGNOSIS — S93601A Unspecified sprain of right foot, initial encounter: Secondary | ICD-10-CM | POA: Diagnosis not present

## 2016-03-22 DIAGNOSIS — M779 Enthesopathy, unspecified: Secondary | ICD-10-CM

## 2016-03-22 LAB — CERVICOVAGINAL ANCILLARY ONLY
Bacterial vaginitis: NEGATIVE
Candida vaginitis: NEGATIVE

## 2016-03-22 NOTE — Telephone Encounter (Signed)
Left a message for a return call.

## 2016-03-22 NOTE — ED Provider Notes (Signed)
[name]    CSN: [id number] Arrival date & time: 03/22/16  0830     History   Chief Complaint Chief Complaint  Patient presents with  . Foot Pain    HPI Shirley Moody is a 56 y.o. female.   While doing aerobics yesterday, patient's right foot slipped off a step and she later had pain in the distal aspect of her right foot.  She has pain with weight bearing.   The history is provided by the patient.  Foot Pain  This is a new problem. The current episode started yesterday. The problem occurs constantly. The problem has not changed since onset.The symptoms are aggravated by walking. Nothing relieves the symptoms. Treatments tried: ice pack and ibuprofen. The treatment provided mild relief.    Past Medical History:  Diagnosis Date  . Allergy   . Colon polyp   . Depression   . Diverticulitis   . IBS (irritable bowel syndrome)     Patient Active Problem List   Diagnosis Date Noted  . Urinary stream splitting 03/20/2016  . Vaginal discharge 03/20/2016  . Osteoporosis 11/17/2015  . Postmenopausal atrophic vaginitis 02/01/2015  . Vulvar lesion 02/01/2015  . Primary osteoarthritis of left knee 06/26/2014  . Impingement syndrome of right shoulder 03/11/2013  . 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 Pain at left costal margin. 11/15/2011  . ADHD (attention deficit hyperactivity disorder) 12/05/2010  . GENERALIZED ANXIETY DISORDER 02/16/2010  . PAP SMEAR, LGSIL, ABNORMAL 02/16/2010  . DIVERTICULITIS, HX OF 01/22/2010  . ALLERGIC RHINITIS 11/16/2009  . COLONIC POLYPS, HYPERPLASTIC 01/27/2009  . DEPRESSION 02/01/2008    Past Surgical History:  Procedure Laterality Date  . DILATION AND CURETTAGE OF UTERUS    . NEUROMA SURGERY     Right foot    OB History    Gravida Para Term Preterm AB Living   1 0     1     SAB TAB Ectopic Multiple Live Births                   Home Medications    Prior to Admission medications   Medication Sig Start Date End  Date Taking? Authorizing Provider  alendronate (FOSAMAX) 70 MG tablet Take 1 tablet (70 mg total) by mouth once a week. Take with a full glass of water on an empty stomach. Patient not taking: Reported on 03/20/2016 12/02/15   Hali Marry, MD  buPROPion (WELLBUTRIN XL) 150 MG 24 hr tablet Take 450 mg by mouth daily.     Historical Provider, MD  cetirizine (ZYRTEC) 10 MG tablet Take 10 mg by mouth daily.      Historical Provider, MD  Estradiol 10 MCG TABS vaginal tablet Insert one tablet vaginally each night for 2 weeks then one tablet twice a week. 03/20/16   Guss Bunde, MD  FLUoxetine (PROZAC) 40 MG capsule TAKE 1 CAPSULE BY MOUTH EVERY DAY 11/19/13   Hali Marry, MD  ibuprofen (ADVIL,MOTRIN) 800 MG tablet Take 1 tablet (800 mg total) by mouth 3 (three) times daily. Patient not taking: Reported on 03/20/2016 10/13/15   Fransico Meadow, PA-C  ipratropium (ATROVENT) 0.06 % nasal spray Place 2 sprays into both nostrils every 4 (four) hours as needed for rhinitis. Patient not taking: Reported on 03/20/2016 03/15/15   Gregor Hams, MD  metroNIDAZOLE (FLAGYL) 500 MG tablet Take 1 tablet (500 mg total) by mouth 3 (three) times daily. For 10 days Patient not taking: Reported on 03/20/2016  12/02/15   Natalie Alexander, DO  mometasone (NASONEX) 50 MCG/ACT nasal spray SHAKE LIQUID AND USE 1 SPRAY IN EACH NOSTRIL EVERY DAY AS DIRECTED Patient not taking: Reported on 03/20/2016 12/28/15   Hali Marry, MD  traZODone (DESYREL) 150 MG tablet TAKE 1 TABLET BY MOUTH EVERY NIGHT AT BEDTIME Patient not taking: Reported on 03/20/2016 11/07/12   Hali Marry, MD  VYVANSE 30 MG capsule TK ONE C PO QAM 06/11/15   Historical Provider, MD    Family History Family History  Problem Relation Age of Onset  . Alcohol abuse Mother   . COPD Mother   . Hypertension Father   . Other Father 45    AMI/ CVA at 75  . Stroke Father   . Colon cancer Father     Social History Social History  Substance  Use Topics  . Smoking status: Former Research scientist (life sciences)  . Smokeless tobacco: Never Used  . Alcohol use 0.0 oz/week     Comment: 1 drink/month     Allergies   Biaxin [clarithromycin]; Concerta [methylphenidate]; and Penicillins   Review of Systems Review of Systems  Moody other systems reviewed and are negative.    Physical Exam Triage Vital Signs ED Triage Vitals  Enc Vitals Group     BP 03/22/16 0852 113/72     Pulse Rate 03/22/16 0852 67     Resp 03/22/16 0852 16     Temp 03/22/16 0852 97.8 F (36.6 C)     Temp Source 03/22/16 0852 Oral     SpO2 03/22/16 0852 99 %     Weight 03/22/16 0857 120 lb (54.4 kg)     Height 03/22/16 0857 5\' 4"  (1.626 m)     Head Circumference --      Peak Flow --      Pain Score 03/22/16 0900 4     Pain Loc --      Pain Edu? --      Excl. in [place]? --    No data found.   Updated Vital Signs BP 113/72 (BP Location: Left Arm)   Pulse 67   Temp 97.8 F (36.6 C) (Oral)   Resp 16   Ht 5\' 4"  (1.626 m)   Wt 120 lb (54.4 kg)   LMP 04/05/2011   SpO2 99%   BMI 20.60 kg/m   Visual Acuity Right Eye Distance:   Left Eye Distance:   Bilateral Distance:    Right Eye Near:   Left Eye Near:    Bilateral Near:     Physical Exam  Constitutional: She appears well-developed and well-nourished. No distress.  HENT:  Head: Atraumatic.  Eyes: Pupils are equal, round, and reactive to light.  Cardiovascular: Normal rate.   Pulmonary/Chest: Effort normal.  Musculoskeletal:       Right foot: There is tenderness and bony tenderness. There is normal range of motion, no swelling, normal capillary refill, no crepitus and no deformity.       Feet:  Right foot has vague tenderness to palpation dorsally over distal metatarsals.  There is distinct tenderness to palpation over extensor tendon to great toe. Right ankle has full range of motion without swelling; minimal tenderness anterior to lateral malleolus.  Neurological: She is alert.  Skin: Skin is warm and dry.   Nursing note and vitals reviewed.    UC Treatments / Results  Labs (Moody labs ordered are listed, but only abnormal results are displayed) Labs Reviewed - No data to display  EKG  EKG Interpretation None       Radiology Dg Foot Complete Right  Result Date: 03/22/2016 CLINICAL DATA:  Right foot pain, missed a step at aerobics class last night EXAM: RIGHT FOOT COMPLETE - 3+ VIEW COMPARISON:  None. FINDINGS: Three views of the right foot submitted. No acute fracture or subluxation. No radiopaque foreign body. No periosteal reaction. No significant soft tissue swelling. IMPRESSION: Negative. Electronically Signed   By: Lahoma Crocker M.D.   On: 03/22/2016 09:24    Procedures Procedures (including critical care time)  Medications Ordered in UC Medications - No data to display   Initial Impression / Assessment and Plan / UC Course  I have reviewed the triage vital signs and the nursing notes.  Pertinent labs & imaging results that were available during my care of the patient were reviewed by me and considered in my medical decision making (see chart for details).    Continue to apply ice pack for 20 to 30 minutes, 2 to 3 times daily  Continue until pain and swelling decrease.  May wear ace wrap if swelling occurs.  Continue Ibuprofen 200mg , 4 tabs every 8 hours with food.  Begin range of motion and stretching exercises as tolerated. Followup with Dr. Aundria Mems or Dr. Lynne Leader ([facility]) if not improving about two weeks.     Final Clinical Impressions(s) / UC Diagnoses   Final diagnoses:  Sprain of right foot, initial encounter  Extensor tendonitis of foot    New Prescriptions New Prescriptions   No medications on file     Kandra Nicolas, MD 03/22/16 [phone]

## 2016-03-22 NOTE — Discharge Instructions (Signed)
Continue to apply ice pack for 20 to 30 minutes, 2 to 3 times daily  Continue until pain and swelling decrease.  May wear ace wrap if swelling occurs.  Continue Ibuprofen 200mg , 4 tabs every 8 hours with food.  Begin range of motion and stretching exercises as tolerated.

## 2016-03-22 NOTE — ED Triage Notes (Signed)
Pt c/o RT foot pain and swelling x 1 day after slipping off a stepper in step class. She took IBF 800mg  last night.

## 2016-03-23 ENCOUNTER — Telehealth: Payer: Self-pay | Admitting: *Deleted

## 2016-03-23 NOTE — Telephone Encounter (Signed)
-----   Message from Guss Bunde, MD sent at 03/23/2016  9:32 AM EST ----- Negative screen for vaginitis.  Pt should be startign Vagifem.

## 2016-03-23 NOTE — Telephone Encounter (Signed)
Pt notified of neg testing for vaginitis and may start her Vagifem.  She has already picked the RX up and will start today.

## 2016-03-24 NOTE — Telephone Encounter (Signed)
Left message for a return call

## 2016-03-28 NOTE — Telephone Encounter (Signed)
Patient states she will call back to schedule.

## 2016-04-07 ENCOUNTER — Encounter: Payer: Self-pay | Admitting: *Deleted

## 2016-04-07 ENCOUNTER — Emergency Department (INDEPENDENT_AMBULATORY_CARE_PROVIDER_SITE_OTHER)
Admission: EM | Admit: 2016-04-07 | Discharge: 2016-04-07 | Disposition: A | Discharge status: Home / Self Care | Payer: BC Managed Care – PPO | Source: Home / Self Care | Attending: Emergency Medicine | Admitting: Emergency Medicine

## 2016-04-07 DIAGNOSIS — R42 Dizziness and giddiness: Secondary | ICD-10-CM

## 2016-04-07 LAB — POCT FASTING CBG KUC MANUAL ENTRY: POCT Glucose (KUC): 106 mg/dL — AB (ref 70–99)

## 2016-04-07 NOTE — ED Notes (Signed)
Pt left AMA after triage. Said she felt fine and had somewhere to be. AMA form signed

## 2016-04-07 NOTE — ED Provider Notes (Signed)
[name]    CSN: [id number] Arrival date & time: 04/07/16  1323     History   Chief Complaint Chief Complaint  Patient presents with  . Dizziness    HPI Shirley Moody is a 56 y.o. female.  Initial HX as quoted from nurses notes:  "Patient c/o onset dizziness today while exercising. Dizziness worsens with movement. C/o minimal nausea. She had a cupcake this AM and drank minimal water. She is currently on leave from work for anxiety and stress. She has not had any Rx medications today."    HPI PT WAS NOT SEEN BY PROVIDER. SHE LEFT AMA  Pt left AMA after triage. Said she felt fine and had somewhere to be. AMA form signed    Electronically signed by Elouise Munroe, RN at 04/07/2016 1:56 PM      Past Medical History:  Diagnosis Date  . Allergy   . Colon polyp   . Depression   . Diverticulitis   . IBS (irritable bowel syndrome)     Patient Active Problem List   Diagnosis Date Noted  . Urinary stream splitting 03/20/2016  . Vaginal discharge 03/20/2016  . Osteoporosis 11/17/2015  . Postmenopausal atrophic vaginitis 02/01/2015  . Vulvar lesion 02/01/2015  . Primary osteoarthritis of left knee 06/26/2014  . Impingement syndrome of right shoulder 03/11/2013  . 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 Pain at left costal margin. 11/15/2011  . ADHD (attention deficit hyperactivity disorder) 12/05/2010  . GENERALIZED ANXIETY DISORDER 02/16/2010  . PAP SMEAR, LGSIL, ABNORMAL 02/16/2010  . DIVERTICULITIS, HX OF 01/22/2010  . ALLERGIC RHINITIS 11/16/2009  . COLONIC POLYPS, HYPERPLASTIC 01/27/2009  . DEPRESSION 02/01/2008    Past Surgical History:  Procedure Laterality Date  . DILATION AND CURETTAGE OF UTERUS    . NEUROMA SURGERY     Right foot    OB History    Gravida Para Term Preterm AB Living   1 0     1     SAB TAB Ectopic Multiple Live Births                   Home Medications    Prior to Admission medications   Medication Sig Start  Date End Date Taking? Authorizing Provider  alendronate (FOSAMAX) 70 MG tablet Take 1 tablet (70 mg total) by mouth once a week. Take with a full glass of water on an empty stomach. 12/02/15   Hali Marry, MD  buPROPion (WELLBUTRIN XL) 150 MG 24 hr tablet Take 450 mg by mouth daily.     Historical Provider, MD  cetirizine (ZYRTEC) 10 MG tablet Take 10 mg by mouth daily.      Historical Provider, MD  Estradiol 10 MCG TABS vaginal tablet Insert one tablet vaginally each night for 2 weeks then one tablet twice a week. 03/20/16   Guss Bunde, MD  FLUoxetine (PROZAC) 40 MG capsule TAKE 1 CAPSULE BY MOUTH EVERY DAY 11/19/13   Hali Marry, MD  mometasone (NASONEX) 50 MCG/ACT nasal spray SHAKE LIQUID AND USE 1 SPRAY IN EACH NOSTRIL EVERY DAY AS DIRECTED Patient not taking: Reported on 03/20/2016 12/28/15   Hali Marry, MD  traZODone (DESYREL) 150 MG tablet TAKE 1 TABLET BY MOUTH EVERY NIGHT AT BEDTIME Patient not taking: Reported on 03/20/2016 11/07/12   Hali Marry, MD  VYVANSE 30 MG capsule TK ONE C PO QAM 06/11/15   Historical Provider, MD    Family History Family History  Problem Relation  Age of Onset  . Alcohol abuse Mother   . COPD Mother   . Hypertension Father   . Other Father 21    AMI/ CVA at 77  . Stroke Father   . Colon cancer Father     Social History Social History  Substance Use Topics  . Smoking status: Former Research scientist (life sciences)  . Smokeless tobacco: Never Used  . Alcohol use 0.0 oz/week     Comment: 1 drink/month     Allergies   Biaxin [clarithromycin]; Concerta [methylphenidate]; and Penicillins   Review of Systems Review of Systems   Physical Exam Triage Vital Signs ED Triage Vitals  Enc Vitals Group     BP 04/07/16 1339 113/73     Pulse Rate 04/07/16 1339 76     Resp --      Temp 04/07/16 1339 98.1 F (36.7 C)     Temp Source 04/07/16 1339 Oral     SpO2 04/07/16 1339 98 %     Weight --      Height --      Head Circumference --        Peak Flow --      Pain Score 04/07/16 1340 0     Pain Loc --      Pain Edu? --      Excl. in [place]? --    Orthostatic VS for the past 24 hrs:  BP- Lying Pulse- Lying BP- Sitting Pulse- Sitting BP- Standing at 0 minutes Pulse- Standing at 0 minutes  04/07/16 1341 113/73 76 114/71 71 97/63 81    Updated Vital Signs BP 113/73 (BP Location: Left Arm)   Pulse 76   Temp 98.1 F (36.7 C) (Oral)   LMP 04/05/2011   SpO2 98%   Visual Acuity Right Eye Distance:   Left Eye Distance:   Bilateral Distance:    Right Eye Near:   Left Eye Near:    Bilateral Near:     Physical Exam   UC Treatments / Results  Labs (all labs ordered are listed, but only abnormal results are displayed) Labs Reviewed  POCT FASTING CBG [facility] MANUAL ENTRY - Abnormal; Notable for the following:       Result Value   POCT Glucose ([facility]) 106 (*)    All other components within normal limits    EKG  EKG Interpretation None       Radiology No results found.  Procedures Procedures (including critical care time)  Medications Ordered in UC Medications - No data to display   Initial Impression / Assessment and Plan / UC Course  I have reviewed the triage vital signs and the nursing notes.  Pertinent labs & imaging results that were available during my care of the patient were reviewed by me and considered in my medical decision making (see chart for details).       Final Clinical Impressions(s) / UC Diagnoses   Final diagnoses:  None    New Prescriptions Discharge Medication List as of 04/07/2016  2:10 PM       Jacqulyn Cane, MD 04/07/16 1921

## 2016-04-07 NOTE — ED Triage Notes (Signed)
Patient c/o onset dizziness today while exercising. Dizziness worsens with movement. C/o minimal nausea. She had a cupcake this AM and drank minimal water. She is currently on leave from work for anxiety and stress. She has not had any Rx medications today.

## 2016-04-07 NOTE — ED Notes (Signed)
Bed: FBP7 Expected date:  Expected time:  Means of arrival:  Comments:

## 2016-04-10 ENCOUNTER — Telehealth: Payer: Self-pay | Admitting: *Deleted

## 2016-04-10 NOTE — Telephone Encounter (Signed)
Callback: LMOM f/u from time in UC. Call back as needed.

## 2016-04-12 ENCOUNTER — Encounter: Payer: Self-pay | Admitting: Obstetrics & Gynecology

## 2016-04-12 ENCOUNTER — Ambulatory Visit (INDEPENDENT_AMBULATORY_CARE_PROVIDER_SITE_OTHER): Payer: BC Managed Care – PPO | Admitting: Obstetrics & Gynecology

## 2016-04-12 VITALS — BP 132/68 | HR 63 | Resp 16 | Ht 64.0 in | Wt 124.0 lb

## 2016-04-12 DIAGNOSIS — L720 Epidermal cyst: Secondary | ICD-10-CM | POA: Diagnosis not present

## 2016-04-12 DIAGNOSIS — N952 Postmenopausal atrophic vaginitis: Secondary | ICD-10-CM | POA: Diagnosis not present

## 2016-04-14 MED ORDER — ESTRADIOL 10 MCG VA TABS: ORAL_TABLET | VAGINAL | 2 refills | Status: DC

## 2016-04-14 NOTE — Progress Notes (Signed)
   Subjective:    Patient ID: Shirley Moody, female    DOB: 08/28/1960, 56 y.o.   MRN: [id number]  HPI Pt presents for f/u of atrophic vaginitis as removal of epidermoid cyst on left labia.  Pt used Vagifem for 2 weeks and and then twice a week.  Her vaginal burning has resolved.  She still has problems with her urine spraying and sees a urologist next month.  She is seeing her counselors and physicians for memory issues.  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    Review of Systems  Constitutional: Negative.   Respiratory: Negative.   Cardiovascular: Negative.   Gastrointestinal: Negative.   Genitourinary: Negative for vaginal discharge and vaginal pain.  Psychiatric/Behavioral: Positive for confusion.       Objective:   Physical Exam  Constitutional: She is oriented to person, place, and time. She appears well-developed and well-nourished. No distress.  HENT:  Head: Normocephalic and atraumatic.  Eyes: Conjunctivae are normal.  Pulmonary/Chest: Effort normal.  Abdominal: Soft. She exhibits no distension.  Genitourinary:  Genitourinary Comments: Epidermoid cyst on left labia majora No evidence of LS or LP Introitus small but pinker and markedly less pain with speculum exam Vagina is less friable and more pal pink Cervix:  Pinpoint and closed.   Musculoskeletal: She exhibits no edema.  Neurological: She is alert and oriented to person, place, and time.  Skin: Skin is warm and dry.  Psychiatric: She has a normal mood and affect.  Vitals reviewed.  Vitals:   04/12/16 1527  BP: 132/68  Pulse: 63  Resp: 16  Weight: 124 lb (56.2 kg)  Height: 5\' 4"  (1.626 m)    Assessment & Plan:  56 yo female with atrophic vaginitis--improving and epidermoid cyst on left labia  1-continue 2x week vagifem 2-cyst removal per patient request (it is painful at times)  See procedure note 3-Urology appt for urine spraying.  VULVAR BIOPSY NOTE  The indications for  vulvar cyst excision were reviewed.   Risks of the excision including pain, bleeding, infection, inadequate specimen, scarring and need for additional procedures  were discussed. The patient stated understanding and agreed to undergo procedure today. Consent was signed,  time out performed.  The patient's vulva was prepped with Betadine. 1% lidocaine was injected into left labia majora. A scalpel was used to incise the cyst (approx .19mm)  Cheesy material was expressed.  The cyst wall was teased out with excised.  Small bleeding was noted and hemostasis was achieved while closing hte incision with 4-0 Vicryl in a running fashion.  The patient tolerated the procedure well. Post-procedure instructions  (pelvic rest for one week) were given to the patient. The patient is to call with heavy bleeding, fever greater than 100.4, foul smelling vaginal discharge or other concerns.

## 2016-05-08 ENCOUNTER — Encounter: Payer: Self-pay | Admitting: Obstetrics & Gynecology

## 2016-06-16 ENCOUNTER — Ambulatory Visit (INDEPENDENT_AMBULATORY_CARE_PROVIDER_SITE_OTHER): Payer: BC Managed Care – PPO | Admitting: Family Medicine

## 2016-06-16 ENCOUNTER — Encounter: Payer: Self-pay | Admitting: Family Medicine

## 2016-06-16 VITALS — BP 105/60 | HR 72 | Ht 63.78 in | Wt 125.0 lb

## 2016-06-16 DIAGNOSIS — R768 Other specified abnormal immunological findings in serum: Secondary | ICD-10-CM

## 2016-06-16 DIAGNOSIS — M791 Myalgia, unspecified site: Secondary | ICD-10-CM

## 2016-06-16 DIAGNOSIS — M2550 Pain in unspecified joint: Secondary | ICD-10-CM

## 2016-06-16 LAB — CBC
HEMATOCRIT: 44.8 % (ref 35.0–45.0)
HEMOGLOBIN: 14.5 g/dL (ref 11.7–15.5)
MCH: 31.3 pg (ref 27.0–33.0)
MCHC: 32.4 g/dL (ref 32.0–36.0)
MCV: 96.8 fL (ref 80.0–100.0)
MPV: 9.6 fL (ref 7.5–12.5)
Platelets: 234 10*3/uL (ref 140–400)
RBC: 4.63 MIL/uL (ref 3.80–5.10)
RDW: 13.4 % (ref 11.0–15.0)
WBC: 3.5 10*3/uL — ABNORMAL LOW (ref 3.8–10.8)

## 2016-06-16 LAB — COMPLETE METABOLIC PANEL WITH GFR
ALBUMIN: 4.3 g/dL (ref 3.6–5.1)
ALK PHOS: 36 U/L (ref 33–130)
ALT: 22 U/L (ref 6–29)
AST: 18 U/L (ref 10–35)
BILIRUBIN TOTAL: 0.5 mg/dL (ref 0.2–1.2)
BUN: 20 mg/dL (ref 7–25)
CO2: 28 mmol/L (ref 20–31)
Calcium: 9.9 mg/dL (ref 8.6–10.4)
Chloride: 103 mmol/L (ref 98–110)
Creat: 0.8 mg/dL (ref 0.50–1.05)
GFR, EST NON AFRICAN AMERICAN: 83 mL/min (ref >=60)
GFR, Est African American: >89 mL/min (ref >=60)
GLUCOSE: 87 mg/dL (ref 65–99)
POTASSIUM: 4.6 mmol/L (ref 3.5–5.3)
SODIUM: 139 mmol/L (ref 135–146)
TOTAL PROTEIN: 6.4 g/dL (ref 6.1–8.1)

## 2016-06-16 LAB — SEDIMENTATION RATE: Sed Rate: 4 mm/hr (ref 0–30)

## 2016-06-16 MED ORDER — MOMETASONE FUROATE 50 MCG/ACT NA SUSP: NASAL | 4 refills | Status: DC

## 2016-06-16 NOTE — Patient Instructions (Signed)
Okay to hold fosamax for a couple of weeks.

## 2016-06-16 NOTE — Progress Notes (Signed)
Subjective:    Patient ID: Shirley Moody, female    DOB: [birth year], 56 y.o.   MRN: [id number]  HPI 56 yo female c/o of pain for the last 1-2 months.  She fels like she is having pain "everywhere".  She has been having a lot of tension in her shoulder and neck.  She has been trying to get massages to help with this. Her pain level is up and down. Today her pain is a 3 out of 10. Wonders if it could be related to her Fosamax. She says particularly in the mornings when she first gets up her hands particularly in all of her knuckles in her wrists feel very sore and this. She's noticed that even when she's exercising and working out for certain things she can't do anymore because if she puts too much pressure on her hands or her wrists very uncomfortable. She says sometimes she feels like there is a weight sitting around her neck resting on her collarbone. She also complains of bilateral elbow and shoulder pain. She has not noticed any redness or swelling in the joints themselves. Sometimes it's an achiness but sometimes it can be more of a sharp pain. She also complains of pain on her outer hips as well as tender points along her IT band on her outer legs.   Review of Systems  She denies any abnormal rashes, fevers chills etc.  BP 105/60   Pulse 72   Ht 5' 3.78" (1.62 m)   Wt 125 lb (56.7 kg)   LMP 04/05/2011   SpO2 98%   BMI 21.60 kg/m     Allergies  Allergen Reactions  . Biaxin [Clarithromycin]     "gives me lockjaw"  . Concerta [Methylphenidate] Other (See Comments)    Says affected her sleep, made her jittery and would suck in her lips  . Penicillins     "amoxicillin doesn't do anything for me anymore so I don't want it'     Past Medical History:  Diagnosis Date  . Allergy   . Colon polyp   . Depression   . Diverticulitis   . IBS (irritable bowel syndrome)     Past Surgical History:  Procedure Laterality Date  . DILATION AND CURETTAGE OF UTERUS    . NEUROMA SURGERY      Right foot    Social History   Social History  . Marital status: Single    Spouse name: N/A  . Number of children: N/A  . Years of education: N/A   Occupational History  . [employer]    school teacher   Social History Main Topics  . Smoking status: Former Research scientist (life sciences)  . Smokeless tobacco: Never Used  . Alcohol use 0.0 oz/week     Comment: 1 drink/month  . Drug use: No  . Sexual activity: Not Currently    Partners: Male    Birth control/ protection: Post-menopausal   Other Topics Concern  . Not on file   Social History Narrative  . No narrative on file    Family History  Problem Relation Age of Onset  . Alcohol abuse Mother   . COPD Mother   . Hypertension Father   . Other Father 52       AMI/ CVA at 30  . Stroke Father   . Colon cancer Father     Outpatient Encounter Prescriptions as of 06/16/2016  Medication Sig  . alendronate (FOSAMAX) 70 MG tablet Take 1 tablet (70 mg  total) by mouth once a week. Take with a full glass of water on an empty stomach.  Marland Kitchen buPROPion (WELLBUTRIN XL) 150 MG 24 hr tablet Take 450 mg by mouth daily.   . Calcium Carbonate (CALCIUM 600 PO) Take 600 mg by mouth.  . cetirizine (ZYRTEC) 10 MG tablet Take 10 mg by mouth daily.    . Cholecalciferol (VITAMIN D PO) Take by mouth.  . Cyanocobalamin (VITAMIN B12 PO) Take 1 tablet by mouth.  . Estradiol 10 MCG TABS vaginal tablet Insert one tablet vaginally twice a week.  Marland Kitchen FLUoxetine (PROZAC) 10 MG capsule TK 1 C PO D  . Magnesium 250 MG TABS Take by mouth.  . mometasone (NASONEX) 50 MCG/ACT nasal spray SHAKE LIQUID AND USE 1 SPRAY IN EACH NOSTRIL EVERY DAY AS DIRECTED  . Probiotic Product (PROBIOTIC PO) Take by mouth.  . traZODone (DESYREL) 100 MG tablet   . VITAMIN K PO Take by mouth.  Marland Kitchen VYVANSE 30 MG capsule TK ONE C PO QAM  . [DISCONTINUED] mometasone (NASONEX) 50 MCG/ACT nasal spray SHAKE LIQUID AND USE 1 SPRAY IN EACH NOSTRIL EVERY DAY AS DIRECTED   No  facility-administered encounter medications on file as of 06/16/2016.          Objective:   Physical Exam  Constitutional: She is oriented to person, place, and time. She appears well-developed and well-nourished.  HENT:  Head: Normocephalic and atraumatic.  Right Ear: External ear normal.  Left Ear: External ear normal.  Nose: Nose normal.  Mouth/Throat: Oropharynx is clear and moist.  TMs and canals are clear.   Eyes: Conjunctivae and EOM are normal. Pupils are equal, round, and reactive to light.  Neck: Neck supple. No thyromegaly present.  Cardiovascular: Normal rate, regular rhythm and normal heart sounds.   Pulmonary/Chest: Effort normal and breath sounds normal. She has no wheezes.  Abdominal: Soft. Bowel sounds are normal. She exhibits no distension and no mass. There is tenderness. There is no rebound and no guarding.  Mount tenderness in the right and left lower quadrants as well as the suprapubic area.  Musculoskeletal: She exhibits no edema.  Lymphadenopathy:    She has no cervical adenopathy.  Neurological: She is alert and oriented to person, place, and time. She has normal reflexes. She displays normal reflexes. No cranial nerve deficit. She exhibits normal muscle tone. Coordination normal.  Strength is 5 out of 5 in upper and lower extremities.  Skin: Skin is warm and dry. No pallor.  Psychiatric: She has a normal mood and affect. Her behavior is normal.  Vitals reviewed.       Assessment & Plan:  Polyarthralgia with some soft tissue pain and discomfort as well. Unclear etiology. We'll look for elevated inflammatory markers. Will check for deficiencies such as iron, vitamin D and magnesium. Will check for elevation in muscle enzyme, CK. I did have her complete questionnaire for screening tool for possible fibromyalgia she actually screened negative. See scanned document.  Okay to hold Fosamax for couple weeks to see if this makes a difference. We will call lab with  lab work results once available.

## 2016-06-17 LAB — TSH: TSH: 1.41 mIU/L

## 2016-06-17 LAB — CK: CK TOTAL: 77 U/L (ref 29–143)

## 2016-06-17 LAB — MAGNESIUM: Magnesium: 2 mg/dL (ref 1.5–2.5)

## 2016-06-17 LAB — FERRITIN: FERRITIN: 283 ng/mL — AB (ref 10–232)

## 2016-06-17 LAB — VITAMIN D 25 HYDROXY (VIT D DEFICIENCY, FRACTURES): VIT D 25 HYDROXY: 64 ng/mL (ref 30–100)

## 2016-06-19 LAB — ANTI-NUCLEAR AB-TITER (ANA TITER): ANA Titer 1: 1:320 {titer} — ABNORMAL HIGH

## 2016-06-19 LAB — C-REACTIVE PROTEIN: CRP: 2.4 mg/L (ref <8.0)

## 2016-06-19 LAB — PTH, INTACT AND CALCIUM
Calcium: 9.9 mg/dL (ref 8.6–10.4)
PTH: 22 pg/mL (ref 14–64)

## 2016-06-19 LAB — ANA: ANA: POSITIVE — AB

## 2016-06-21 NOTE — Addendum Note (Signed)
Addended by: Teddy Spike on: 06/21/2016 04:28 PM   Modules accepted: Orders

## 2016-07-11 DIAGNOSIS — M329 Systemic lupus erythematosus, unspecified: Secondary | ICD-10-CM | POA: Insufficient documentation

## 2016-07-14 ENCOUNTER — Encounter: Payer: Self-pay | Admitting: Family Medicine

## 2016-07-14 ENCOUNTER — Ambulatory Visit (INDEPENDENT_AMBULATORY_CARE_PROVIDER_SITE_OTHER): Payer: BC Managed Care – PPO | Admitting: Family Medicine

## 2016-07-14 VITALS — BP 112/70 | HR 67 | Ht 64.0 in | Wt 126.0 lb

## 2016-07-14 DIAGNOSIS — R7989 Other specified abnormal findings of blood chemistry: Secondary | ICD-10-CM | POA: Diagnosis not present

## 2016-07-14 DIAGNOSIS — M818 Other osteoporosis without current pathological fracture: Secondary | ICD-10-CM | POA: Diagnosis not present

## 2016-07-14 DIAGNOSIS — R768 Other specified abnormal immunological findings in serum: Secondary | ICD-10-CM

## 2016-07-14 NOTE — Progress Notes (Signed)
   Subjective:    Patient ID: Shirley Moody, female    DOB: 04/13/1960, 56 y.o.   MRN: [id number]  HPI 56 year old female follows up today for polyarthralgia. After coming back with an elevated ANA 1:320 With muscle aches and pains I referred her to rheumatology for further evaluation. She was seen approximately 10 days ago at [facility]. She did screen negative for fibromyalgia. They did do some additional lab work. She says the nurse there she called her back and said that she actually does have lupus. They wanted to go ahead and start her on Plaquenil and prednisone. She actually has not had a follow-up appointment with the physician yet but is scheduled for next Tuesday. She has picked up the prescriptions but has opted not to start them until she is able to meet with him and discuss her diagnosis and further detail and discussed potential side effects and expectations of the medication.  I did have her hold her Fosamax for about 4 weeks just to see if she actually felt better off the medication. Rheumatologist recommended that she restart the Fosamax. She really didn't notice a big difference off the medication though she has been feeling a little bit better yesterday and today.  He also wanted to go over her lab results that we did here in the office in detail. I did try to pull the lab work done at [facility] in the only thing that I could get access to was the sedimentation rate. Per the office visit note there were several other labs that were ordered.  Review of Systems     Objective:   Physical Exam  Constitutional: She is oriented to person, place, and time. She appears well-developed and well-nourished.  HENT:  Head: Normocephalic and atraumatic.  Eyes: Conjunctivae and EOM are normal.  Cardiovascular: Normal rate.   Pulmonary/Chest: Effort normal.  Neurological: She is alert and oriented to person, place, and time.  Skin: Skin is dry. No pallor.  Psychiatric: She has a normal mood  and affect. Her behavior is normal.  Vitals reviewed.         Assessment & Plan:  Elevated ANA/possible lupus-I did look in care everywhere to see if there were any notations in her chart about a formal diagnosis. Did not see anything. I did encourage her to wait until Tuesday until she is able to meet with Dr. Posey Pronto to go over her results and discuss her diagnosis. We did discuss some potential side effects of the medications including the prednisone which can increased irritability, and increased hunger etc. Also discussed that she will have to have regular eye exams with Plaquenil but did encourage her to discuss this further with her rheumatologist. She will contact me through my chart after her visit with a rheumatologist and K she has any further concerns or questions.  Osteoporosis-go ahead and restart Fosamax and she did notice a big difference off of the medication.  Reviewed her lab work in detail. Really the only abnormalities were a slightly elevated Ariton level as well as the elevated ANA. Everything else was essentially normal reassuring.

## 2016-09-19 ENCOUNTER — Encounter: Payer: Self-pay | Admitting: Family Medicine

## 2016-09-19 DIAGNOSIS — K9041 Non-celiac gluten sensitivity: Secondary | ICD-10-CM

## 2016-09-28 ENCOUNTER — Encounter: Payer: Self-pay | Admitting: Rehabilitative and Restorative Service Providers"

## 2016-09-28 ENCOUNTER — Ambulatory Visit (INDEPENDENT_AMBULATORY_CARE_PROVIDER_SITE_OTHER): Payer: BC Managed Care – PPO | Admitting: Rehabilitative and Restorative Service Providers"

## 2016-09-28 ENCOUNTER — Ambulatory Visit (INDEPENDENT_AMBULATORY_CARE_PROVIDER_SITE_OTHER): Payer: BC Managed Care – PPO

## 2016-09-28 ENCOUNTER — Encounter: Payer: Self-pay | Admitting: Family Medicine

## 2016-09-28 ENCOUNTER — Ambulatory Visit (INDEPENDENT_AMBULATORY_CARE_PROVIDER_SITE_OTHER): Payer: BC Managed Care – PPO | Admitting: Family Medicine

## 2016-09-28 VITALS — BP 111/66 | HR 61 | Wt 125.0 lb

## 2016-09-28 DIAGNOSIS — M7918 Myalgia, other site: Secondary | ICD-10-CM

## 2016-09-28 DIAGNOSIS — M4185 Other forms of scoliosis, thoracolumbar region: Secondary | ICD-10-CM | POA: Diagnosis not present

## 2016-09-28 DIAGNOSIS — R29898 Other symptoms and signs involving the musculoskeletal system: Secondary | ICD-10-CM

## 2016-09-28 DIAGNOSIS — R293 Abnormal posture: Secondary | ICD-10-CM | POA: Diagnosis not present

## 2016-09-28 DIAGNOSIS — M546 Pain in thoracic spine: Secondary | ICD-10-CM

## 2016-09-28 DIAGNOSIS — M791 Myalgia: Secondary | ICD-10-CM | POA: Diagnosis not present

## 2016-09-28 MED ORDER — CYCLOBENZAPRINE HCL 5 MG PO TABS: 5.0000 mg | ORAL_TABLET | Freq: Every evening | ORAL | 1 refills | Status: DC | PRN

## 2016-09-28 NOTE — Progress Notes (Signed)
Shirley Moody is a 56 y.o. female who presents to [facility] today for back pain. Patient notes a one week history of significant thoracic back pain. She was bending forward and stretching when she felt a pop in her back. She notes pain at the mid thoracic area near the bra strap at the midline into the right side of her back. She denies any radiating across her chest down her arms or legs. She denies any weakness or numbness. She notes the pain is worse with motion and activity but still somewhat present at bedtime. The pain is interfering with sleep. She denies any trouble breathing or palpitations or significant chest pain. She has tried ibuprofen and heating pad which is minimally helpful.   Past Medical History:  Diagnosis Date  . Allergy   . Colon polyp   . Depression   . Diverticulitis   . IBS (irritable bowel syndrome)   . Osteoporosis 11/17/2015   DEXA 2017   Past Surgical History:  Procedure Laterality Date  . DILATION AND CURETTAGE OF UTERUS    . NEUROMA SURGERY     Right foot   Social History  Substance Use Topics  . Smoking status: Former Research scientist (life sciences)  . Smokeless tobacco: Never Used  . Alcohol use 0.0 oz/week     Comment: 1 drink/month     ROS:  As above   Medications: Current Outpatient Prescriptions  Medication Sig Dispense Refill  . alendronate (FOSAMAX) 70 MG tablet Take 1 tablet (70 mg total) by mouth once a week. Take with a full glass of water on an empty stomach. 12 tablet 3  . buPROPion (WELLBUTRIN XL) 150 MG 24 hr tablet Take 450 mg by mouth daily.     . Calcium Carbonate (CALCIUM 600 PO) Take 600 mg by mouth.    . cetirizine (ZYRTEC) 10 MG tablet Take 10 mg by mouth daily.      . Estradiol 10 MCG TABS vaginal tablet Insert one tablet vaginally twice a week. 30 tablet 2  . FLUoxetine (PROZAC) 10 MG capsule TK 1 C PO D  2  . Magnesium 250 MG TABS Take by mouth.    . mometasone (NASONEX) 50 MCG/ACT nasal  spray SHAKE LIQUID AND USE 1 SPRAY IN EACH NOSTRIL EVERY DAY AS DIRECTED 17 g 4  . Probiotic Product (PROBIOTIC PO) Take by mouth.    . traZODone (DESYREL) 100 MG tablet     . cyclobenzaprine (FLEXERIL) 5 MG tablet Take 1-2 tablets (5-10 mg total) by mouth at bedtime as needed for muscle spasms. 30 tablet 1   No current facility-administered medications for this visit.    Allergies  Allergen Reactions  . Biaxin [Clarithromycin]     "gives me lockjaw"  . Concerta [Methylphenidate] Other (See Comments)    Says affected her sleep, made her jittery and would suck in her lips  . Penicillins     "amoxicillin doesn't do anything for me anymore so I don't want it'      Exam:  BP 111/66   Pulse 61   Wt 125 lb (56.7 kg)   LMP 04/05/2011   BMI 21.46 kg/m  General: Well Developed, well nourished, and in no acute distress.  Neuro/Psych: Alert and oriented x3, extra-ocular muscles intact, able to move all 4 extremities, sensation grossly intact. Skin: Warm and dry, no rashes noted.  Respiratory: Not using accessory muscles, speaking in full sentences, trachea midline.  Cardiovascular: Pulses palpable, no extremity edema. Abdomen:  Does not appear distended. MSK:  Thoracic spine mildly tender palpation of the spinal midline at and around T8 or T10 area. No palpable step-offs are present. Tender to palpation at the right thoracic paraspinal muscle group as well as the rhomboid muscle. Scapular motion is intact and normal bilaterally but painful. Upper extremity strength reflexes and sensation are intact. Lower extremity strength sensation reflexes are intact.  X-ray thoracic spine: No obvious vertebral compression fracture Awaiting radiology review     No results found for this or any previous visit (from the past 48 hour(s)). No results found.    Assessment and Plan: 56 y.o. female with right-sided back pain likely rhomboid muscle strain based on symptoms and x-ray findings. Plan to  treat with physical therapy as well as cyclobenzaprine TENS unit heating pad and prescription strength NSAIDs. Recheck in 4 weeks or sooner if needed.    Orders Placed This Encounter  Procedures  . DG Thoracic Spine W/Swimmers    Standing Status:   Future    Number of Occurrences:   1    Standing Expiration Date:   11/28/2017    Order Specific Question:   Reason for Exam (SYMPTOM  OR DIAGNOSIS REQUIRED)    Answer:   eval ? compression fracture around T8-10 level    Order Specific Question:   Is patient pregnant?    Answer:   No    Order Specific Question:   Preferred imaging location?    Answer:   [facility]    Order Specific Question:   Radiology Contrast Protocol - do NOT remove file path    Answer:   [email or web address]  . Ambulatory referral to Physical Therapy    Referral Priority:   Routine    Referral Type:   Physical Medicine    Referral Reason:   Specialty Services Required    Requested Specialty:   Physical Therapy   Meds ordered this encounter  Medications  . cyclobenzaprine (FLEXERIL) 5 MG tablet    Sig: Take 1-2 tablets (5-10 mg total) by mouth at bedtime as needed for muscle spasms.    Dispense:  30 tablet    Refill:  1    Discussed warning signs or symptoms. Please see discharge instructions. Patient expresses understanding.

## 2016-09-28 NOTE — Patient Instructions (Signed)
Axial Extension (Chin Tuck)    Pull chin in and lengthen back of neck. Hold __10 __ seconds while counting out loud. Repeat __5__ times. Do _several___ sessions per day.   Shoulder Blade Squeeze    Rotate shoulders back, then squeeze shoulder blades down and back. Hold 10 sec Repeat __10__ times. Do _several _ sessions per day.  Scapula Adduction With Pectoralis Stretch: Low - Standing   Shoulders at 45 hands even with shoulders, keeping weight through legs, shift weight forward until you feel pull or stretch through the front of your chest. Hold _30__ seconds. Do _3__ times, _2-4__ times per day.   Scapula Adduction With Pectoralis Stretch: Mid-Range - Standing   Shoulders at 90 elbows even with shoulders, keeping weight through legs, shift weight forward until you feel pull or strength through the front of your chest. Hold __30_ seconds. Do _3__ times, __2-4_ times per day.   Scapula Adduction With Pectoralis Stretch: High - Standing   Shoulders at 120 hands up high on the doorway, keeping weight on feet, shift weight forward until you feel pull or stretch through the front of your chest. Hold _30__ seconds. Do _3__ times, _2-3__ times per day.   Self massage using ~ 3 inch plastic ball    Sleeping on Back  Place pillow under knees. A pillow with cervical support and a roll around waist are also helpful. Copyright  VHI. All rights reserved.  Sleeping on Side Place pillow between knees. Use cervical support under neck and a roll around waist as needed. Copyright  VHI. All rights reserved.   Sleeping on Stomach   If this is the only desirable sleeping position, place pillow under lower legs, and under stomach or chest as needed.  Posture - Sitting   Sit upright, head facing forward. Try using a roll to support lower back. Keep shoulders relaxed, and avoid rounded back. Keep hips level with knees. Avoid crossing legs for long periods. Stand to Sit / Sit to  Stand   To sit: Bend knees to lower self onto front edge of chair, then scoot back on seat. To stand: Reverse sequence by placing one foot forward, and scoot to front of seat. Use rocking motion to stand up.   Work Height and Reach  Ideal work height is no more than 2 to 4 inches below elbow level when standing, and at elbow level when sitting. Reaching should be limited to arm's length, with elbows slightly bent.  Bending  Bend at hips and knees, not back. Keep feet shoulder-width apart.    Posture - Standing   Good posture is important. Avoid slouching and forward head thrust. Maintain curve in low back and align ears over shoul- ders, hips over ankles.  Alternating Positions   Alternate tasks and change positions frequently to reduce fatigue and muscle tension. Take rest breaks. Computer Work   Position work to Programmer, multimedia. Use proper work and seat height. Keep shoulders back and down, wrists straight, and elbows at right angles. Use chair that provides full back support. Add footrest and lumbar roll as needed.  Getting Into / Out of Car  Lower self onto seat, scoot back, then bring in one leg at a time. Reverse sequence to get out.  Dressing  Lie on back to pull socks or slacks over feet, or sit and bend leg while keeping back straight.    Housework - Sink  Place one foot on ledge of cabinet under sink when standing at sink  for prolonged periods.   Pushing / Pulling  Pushing is preferable to pulling. Keep back in proper alignment, and use leg muscles to do the work.  Deep Squat   Squat and lift with both arms held against upper trunk. Tighten stomach muscles without holding breath. Use smooth movements to avoid jerking.  Avoid Twisting   Avoid twisting or bending back. Pivot around using foot movements, and bend at knees if needed when reaching for articles.  Carrying Luggage   Distribute weight evenly on both sides. Use a cart whenever possible. Do  not twist trunk. Move body as a unit.   Lifting Principles .Maintain proper posture and head alignment. .Slide object as close as possible before lifting. .Move obstacles out of the way. .Test before lifting; ask for help if too heavy. .Tighten stomach muscles without holding breath. .Use smooth movements; do not jerk. .Use legs to do the work, and pivot with feet. .Distribute the work load symmetrically and close to the center of trunk. .Push instead of pull whenever possible.   Ask For Help   Ask for help and delegate to others when possible. Coordinate your movements when lifting together, and maintain the low back curve.  Log Roll   Lying on back, bend left knee and place left arm across chest. Roll all in one movement to the right. Reverse to roll to the left. Always move as one unit. Housework - Sweeping  Use long-handled equipment to avoid stooping.   Housework - Wiping  Position yourself as close as possible to reach work surface. Avoid straining your back.  Laundry - Unloading Wash   To unload small items at bottom of washer, lift leg opposite to arm being used to reach.  [place] close to area to be raked. Use arm movements to do the work. Keep back straight and avoid twisting.     Cart  When reaching into cart with one arm, lift opposite leg to keep back straight.   Getting Into / Out of Bed  Lower self to lie down on one side by raising legs and lowering head at the same time. Use arms to assist moving without twisting. Bend both knees to roll onto back if desired. To sit up, start from lying on side, and use same move-ments in reverse. Housework - Vacuuming  Hold the vacuum with arm held at side. Step back and forth to move it, keeping head up. Avoid twisting.   Laundry - IT consultant so that bending and twisting can be avoided.   Laundry - Unloading Dryer  Squat down to reach into clothes dryer or use a  reacher.  Gardening - Weeding / Probation officer or Kneel. Knee pads may be helpful.

## 2016-09-28 NOTE — Patient Instructions (Signed)
Thank you for coming in today. Attend PT.  Use the heating pad.  Continue ibuprofen for pain as needed.  Use cyclobenzaprine muscle relaxer at bedtime as needed.  Use a TENS unit.   Recheck with me in 4-6 weeks if not better.  Return sooner if needed.   TENS UNIT: This is helpful for muscle pain and spasm.   Search and Purchase a TENS 7000 2nd edition at  www.tenspros.com or www.[place].com It should be less than $30.     TENS unit instructions: Do not shower or bathe with the unit on Turn the unit off before removing electrodes or batteries If the electrodes lose stickiness add a drop of water to the electrodes after they are disconnected from the unit and place on plastic sheet. If you continued to have difficulty, call the TENS unit company to purchase more electrodes. Do not apply lotion on the skin area prior to use. Make sure the skin is clean and dry as this will help prolong the life of the electrodes. After use, always check skin for unusual red areas, rash or other skin difficulties. If there are any skin problems, does not apply electrodes to the same area. Never remove the electrodes from the unit by pulling the wires. Do not use the TENS unit or electrodes other than as directed. Do not change electrode placement without consultating your therapist or physician. Keep 2 fingers with between each electrode. Wear time ratio is 2:1, on to off times.    For example on for 30 minutes off for 15 minutes and then on for 30 minutes off for 15 minutes

## 2016-09-28 NOTE — Therapy (Signed)
[facility] [place], [place], [address] Phone: [phone]   Fax:  [phone]  Physical Therapy Evaluation  Patient Details  Name: Shirley Moody MRN: [id number] Date of Birth: 03-Nov-1960 Referring Provider: Dr Lynne Leader   Encounter Date: 09/28/2016      PT End of Session - 09/28/16 1602    Visit Number 1   Number of Visits 12   Date for PT Re-Evaluation 11/08/16   PT Start Time 1856   PT Stop Time 1602   PT Time Calculation (min) 68 min   Activity Tolerance Patient tolerated treatment well      Past Medical History:  Diagnosis Date  . Allergy   . Colon polyp   . Depression   . Diverticulitis   . IBS (irritable bowel syndrome)   . Osteoporosis 11/17/2015   DEXA 2017    Past Surgical History:  Procedure Laterality Date  . DILATION AND CURETTAGE OF UTERUS    . NEUROMA SURGERY     Right foot    There were no vitals filed for this visit.       Subjective Assessment - 09/28/16 1504    Subjective Patient reports that she eas having LBP 2 weeks ago. About a week ago she sat up in bed and felt a pop in the Rt mid back area. She can function to do most ADLs' but continues to have intermittent pain which can be sharp in nature and unexpected. She has had some "pulled muscles" before but never anything like this.    Pertinent History Arthritis mid and LB; hips; Lt knee; shds; Lupus dx in June 2018; osteopenia   How long can you sit comfortably? varies    How long can you stand comfortably? prolonged standing ~ 1-1 1/2 hours    How long can you walk comfortably? 45 min    Diagnostic tests xrays    Patient Stated Goals get rid of pain    Currently in Pain? Yes   Pain Score 5    Pain Location Thoracic   Pain Orientation Right   Pain Descriptors / Indicators Dull;Stabbing  occasionally stabbing with certain movements    Pain Type Acute pain   Pain Onset 1 to 4 weeks ago   Pain Frequency  Intermittent   Aggravating Factors  sleeping with head propped on pillow; lying on side, stomach or back - hard to get comfortable; deep breathe; reaching; lifting; brushing and drying hair; bending over    Pain Relieving Factors heating pad; meds            [facility] PT Assessment - 09/28/16 0001      Assessment   Medical Diagnosis Rt thoracic pain    Referring Provider Dr Lynne Leader    Onset Date/Surgical Date 09/21/16   Hand Dominance Right   Next MD Visit 10/31/16   Prior Therapy yes      Precautions   Precautions None     Balance Screen   Has the patient fallen in the past 6 months No   Has the patient had a decrease in activity level because of a fear of falling?  No   Is the patient reluctant to leave their home because of a fear of falling?  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    Leisure household chores; exercise 3x/wk; travel; pets; swimming at least 2x/wk  Observation/Other Assessments   Focus on Therapeutic Outcomes (FOTO)  56% limitation      Sensation   Additional Comments WFL's per pt report      Posture/Postural Control   Posture Comments head forward; shoudlers rounded and elevated; increased thoracic kyphosis; scapulae abducted and rotated along the thoracic wall      AROM   Right/Left Shoulder --  WNL's bilat    Cervical Flexion 55   Cervical Extension 34   Cervical - Right Side Bend 35   Cervical - Left Side Bend 20  pain; pulling; tightness    Cervical - Right Rotation 74   Cervical - Left Rotation 67  pain pulling      Strength   Overall Strength Comments 5/5 except lower trap Rt 5-/5 and painful      Palpation   Spinal mobility hypomobile and tender cervical and thoracic through T8/9 CPA and lateral glides Rt > Lt    Palpation comment muscular tightness bilar Rt > Lt pecs; upper trap; leveator; rhomboids; thoracic paraspinals     Special Tests     Special Tests --  pain with spring testing ribs Rt > Lt             Objective measurements completed on examination: See above findings.          [facility] Adult PT Treatment/Exercise - 09/28/16 0001      Therapeutic Activites    Therapeutic Activities --  instructed in myofacial ball release work      Neuro Re-ed    Neuro Re-ed Details  initiated work on posture and alignment engaging posterior shoudler girdle musculature      Shoulder Exercises: Standing   Other Standing Exercises scap squeeze 10 sec x 10 with noodle      Shoulder Exercises: Stretch   Other Shoulder Stretches 3 way doorway stretch 30 sec x 2      Moist Heat Therapy   Number Minutes Moist Heat 20 Minutes   Moist Heat Location Shoulder  thoracic spine      Electrical Stimulation   Electrical Stimulation Location Rt posterior thoracic spine    Electrical Stimulation Action IFC   Electrical Stimulation Parameters to tolerance   Electrical Stimulation Goals Pain;Tone                PT Education - 09/28/16 1601    Education provided Yes   Education Details HEP; posture   Person(s) Educated Patient   Methods Explanation;Demonstration;Tactile cues;Verbal cues;Handout   Comprehension Verbalized understanding;Returned demonstration;Verbal cues required;Tactile cues required             PT Long Term Goals - 09/28/16 1615      PT LONG TERM GOAL #1   Title I with advanced HEP 11/09/16   Time 6   Period Weeks   Status New     PT LONG TERM GOAL #2   Title Improve posture and alignment with patient to demonstrate improved upright posture with engagement of posterior shoudler girdle musculature 11/09/16   Time 6   Period Weeks   Status New     PT LONG TERM GOAL #3   Title patient reports 50-75% resloution of thoracic and Rt shoulder girdle pain 11/09/16   Time 6   Period Weeks   Status New     PT LONG TERM GOAL #4   Title Increase cervical ROM by 8-10 degrees in limited planes  11/09/16   Time 6   Period Weeks   Status  New     PT LONG TERM GOAL #5   Title Improve FOTO to </= 35% limitation 11/09/16   Time 6   Period Weeks   Status New                Plan - 09/28/16 1605    Clinical Impression Statement Patient presents with thoracic myofacial dysfunction with pain on an intermittent basis which seems to be related to movement and positioning. She has limited cervical ROM; poor thoracic spine mobilty; muscular tightness to palpation through the cervical and thoracic musculature; pain with spring testing through ribs. She has tightness through the T5/6/7/8 area. Patient has tightness through the anterior chest and upper traps as well sa weakness in posterior shoudler girdle musculature. She will benefit from PT to address problems identified.    Clinical Presentation Evolving   Clinical Decision Making Low   Rehab Potential Good   PT Frequency 2x / week   PT Duration 6 weeks   PT Next Visit Plan review HEP; continue to work on postural correction; add manual work through the cervical and thoracic musculature; gentle mobs through same and ribs; modlaities as indicated    Consulted and Agree with Plan of Care Patient      Patient will benefit from skilled therapeutic intervention in order to improve the following deficits and impairments:  Postural dysfunction, Improper body mechanics, Pain, Increased fascial restricitons, Increased muscle spasms, Decreased range of motion, Decreased mobility, Decreased strength, Decreased activity tolerance  Visit Diagnosis: Pain in thoracic spine - Plan: PT plan of care cert/re-cert  Other symptoms and signs involving the musculoskeletal system - Plan: PT plan of care cert/re-cert  Abnormal posture - Plan: PT plan of care cert/re-cert     Problem List Patient Active Problem List   Diagnosis Date Noted  . Urinary stream splitting 03/20/2016  . Osteoporosis 11/17/2015  . Postmenopausal atrophic vaginitis  02/01/2015  . Vulvar lesion 02/01/2015  . Primary osteoarthritis of left knee 06/26/2014  . Impingement syndrome of right shoulder 03/11/2013  . 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 Pain at left costal margin. 11/15/2011  . ADHD (attention deficit hyperactivity disorder) 12/05/2010  . GENERALIZED ANXIETY DISORDER 02/16/2010  . PAP SMEAR, LGSIL, ABNORMAL 02/16/2010  . DIVERTICULITIS, HX OF 01/22/2010  . ALLERGIC RHINITIS 11/16/2009  . COLONIC POLYPS, HYPERPLASTIC 01/27/2009  . DEPRESSION 02/01/2008    Celyn Nilda Simmer PT, MPH  09/28/2016, 5:49 PM  [facility] [place], [place], [address] Phone: [phone]   Fax:  [phone]  Name: Shirley Moody MRN: [id number] Date of Birth: [birth year]

## 2016-10-04 ENCOUNTER — Encounter: Payer: Self-pay | Admitting: Rehabilitative and Restorative Service Providers"

## 2016-10-04 ENCOUNTER — Ambulatory Visit (INDEPENDENT_AMBULATORY_CARE_PROVIDER_SITE_OTHER): Payer: BC Managed Care – PPO | Admitting: Rehabilitative and Restorative Service Providers"

## 2016-10-04 DIAGNOSIS — M546 Pain in thoracic spine: Secondary | ICD-10-CM

## 2016-10-04 DIAGNOSIS — R29898 Other symptoms and signs involving the musculoskeletal system: Secondary | ICD-10-CM | POA: Diagnosis not present

## 2016-10-04 DIAGNOSIS — R293 Abnormal posture: Secondary | ICD-10-CM

## 2016-10-04 NOTE — Patient Instructions (Addendum)
Resisted External Rotation: in Neutral - Bilateral   PALMS UP Sit or stand, tubing in both hands, elbows at sides, bent to 90, forearms forward. Pinch shoulder blades together and rotate forearms out. Keep elbows at sides. Repeat __10__ times per set. Do _2-3___ sets per session. Do _2-3___ sessions per day.     Scapular Retraction: Elbow Flexion (Standing)    With elbows bent to 90, pinch shoulder blades together and rotate arms out, keeping elbows bent. Repeat __10__ times per set. Do __1-2__ sets per session. Do _4-5 ___ sessions per day.    SUPINE Tips A    Being in the supine position means to be lying on the back. Lying on the back is the position of least compression on the bones and discs of the spine, and helps to re-align the natural curves of the back. Work toward 3-5 minutes - can bend elbows to release stretch as needed then straighten arms again. Gradually work toward bringing arms up to 120 degrees from side. Can lie on swim noodle as this stretch becomes easy.

## 2016-10-04 NOTE — Therapy (Signed)
[place], [place], [address] Phone: [phone]   Fax:  [phone]  Physical Therapy Treatment  Patient Details  Name: Shirley Moody MRN: [id number] Date of Birth: 02/20/1960 Referring Provider: Dr Lynne Leader   Encounter Date: 10/04/2016      PT End of Session - 10/04/16 0805    Visit Number 2   Number of Visits 12   Date for PT Re-Evaluation 11/08/16   PT Start Time 0803   PT Stop Time 0900   PT Time Calculation (min) 57 min   Activity Tolerance Patient tolerated treatment well      Past Medical History:  Diagnosis Date  . Allergy   . Colon polyp   . Depression   . Diverticulitis   . IBS (irritable bowel syndrome)   . Osteoporosis 11/17/2015   DEXA 2017    Past Surgical History:  Procedure Laterality Date  . DILATION AND CURETTAGE OF UTERUS    . NEUROMA SURGERY     Right foot    There were no vitals filed for this visit.      Subjective Assessment - 10/04/16 0808    Subjective Patient reports that she is significantly improved. She has been doing her exercises and working on the posture. Pleased with progress. Symptoms continue but not as bad. Can live life now.    Currently in Pain? Yes   Pain Score 2    Pain Location Thoracic   Pain Orientation Right   Pain Descriptors / Indicators Dull;Stabbing   Pain Type Acute pain   Pain Onset 1 to 4 weeks ago   Pain Frequency Intermittent                         [facility] Adult PT Treatment/Exercise - 10/04/16 0001      Neuro Re-ed    Neuro Re-ed Details  work on posture and alignment engaging posterior shoudler girdle musculature      Shoulder Exercises: Supine   Other Supine Exercises snow angel supine ~ 3 min UE's at ~ 100 deg      Shoulder Exercises: Standing   Retraction AROM;Strengthening;Both;15 reps;Theraband  one set woithout one with TB   Theraband Level (Shoulder Retraction) Level 1 (Yellow)    Retraction Limitations W's x 10    Other Standing Exercises scap squeeze 10 sec x 10 with noodle    Other Standing Exercises axial extension 10 sec x 5      Shoulder Exercises: Stretch   Other Shoulder Stretches 3 way doorway stretch 30 sec x 2      Moist Heat Therapy   Number Minutes Moist Heat 20 Minutes   Moist Heat Location Shoulder  thoracic spine      Electrical Stimulation   Electrical Stimulation Location Rt pecs/ thoracic spine    Electrical Stimulation Action IFC   Electrical Stimulation Parameters to tolerance   Electrical Stimulation Goals Pain;Tone     Manual Therapy   Manual therapy comments pt supine    Joint Mobilization cervical CPA mobs    Soft tissue mobilization bilat cervical/pecs/thoracic    Myofascial Release pecs   Passive ROM springing through ribs bialt    Manual Traction cervical                 PT Education - 10/04/16 0817    Education provided Yes   Education Details HEP    Person(s) Educated Patient   Methods Explanation;Demonstration;Tactile  cues;Verbal cues;Handout   Comprehension Verbalized understanding;Returned demonstration;Verbal cues required;Tactile cues required             PT Long Term Goals - 10/04/16 0806      PT LONG TERM GOAL #1   Title I with advanced HEP 11/09/16   Time 6   Period Weeks   Status On-going     PT LONG TERM GOAL #2   Title Improve posture and alignment with patient to demonstrate improved upright posture with engagement of posterior shoudler girdle musculature 11/09/16   Time 6   Period Weeks   Status On-going     PT LONG TERM GOAL #3   Title patient reports 50-75% resloution of thoracic and Rt shoulder girdle pain 11/09/16   Time 6   Period Weeks   Status On-going     PT LONG TERM GOAL #4   Title Increase cervical ROM by 8-10 degrees in limited planes 11/09/16   Time 6   Period Weeks   Status On-going     PT LONG TERM GOAL #5   Title Improve FOTO to </= 35% limitation 11/09/16    Time 6   Period Weeks   Status On-going               Plan - 10/04/16 0817    Clinical Impression Statement Progressing well with decreased pain and improved functional activity level. Note improved thoracic mobilty with decresed tissue tightness. Progressing well toward stated goals of therapy.    Rehab Potential Good   PT Frequency 2x / week   PT Duration 6 weeks   PT Next Visit Plan HEP; continue to work on postural correction; add manual work through the cervical and thoracic musculature; gentle mobs through same and ribs; modlaities as indicated    Consulted and Agree with Plan of Care Patient      Patient will benefit from skilled therapeutic intervention in order to improve the following deficits and impairments:  Postural dysfunction, Improper body mechanics, Pain, Increased fascial restricitons, Increased muscle spasms, Decreased range of motion, Decreased mobility, Decreased strength, Decreased activity tolerance  Visit Diagnosis: Pain in thoracic spine  Other symptoms and signs involving the musculoskeletal system  Abnormal posture     Problem List Patient Active Problem List   Diagnosis Date Noted  . Urinary stream splitting 03/20/2016  . Osteoporosis 11/17/2015  . Postmenopausal atrophic vaginitis 02/01/2015  . Vulvar lesion 02/01/2015  . Primary osteoarthritis of left knee 06/26/2014  . Impingement syndrome of right shoulder 03/11/2013  . 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 Pain at left costal margin. 11/15/2011  . ADHD (attention deficit hyperactivity disorder) 12/05/2010  . GENERALIZED ANXIETY DISORDER 02/16/2010  . PAP SMEAR, LGSIL, ABNORMAL 02/16/2010  . DIVERTICULITIS, HX OF 01/22/2010  . ALLERGIC RHINITIS 11/16/2009  . COLONIC POLYPS, HYPERPLASTIC 01/27/2009  . DEPRESSION 02/01/2008    Celyn Nilda Simmer PT, MPH  10/04/2016, 8:52 AM  [facility] [place], [place],  [address] Phone: [phone]   Fax:  [phone]  Name: Shirley Moody MRN: [id number] Date of Birth: 08-Nov-1960

## 2016-10-06 ENCOUNTER — Encounter: Payer: Self-pay | Admitting: Rehabilitative and Restorative Service Providers"

## 2016-10-06 ENCOUNTER — Ambulatory Visit (INDEPENDENT_AMBULATORY_CARE_PROVIDER_SITE_OTHER): Payer: BC Managed Care – PPO | Admitting: Rehabilitative and Restorative Service Providers"

## 2016-10-06 DIAGNOSIS — R29898 Other symptoms and signs involving the musculoskeletal system: Secondary | ICD-10-CM

## 2016-10-06 DIAGNOSIS — R293 Abnormal posture: Secondary | ICD-10-CM

## 2016-10-06 DIAGNOSIS — M546 Pain in thoracic spine: Secondary | ICD-10-CM | POA: Diagnosis not present

## 2016-10-06 NOTE — Patient Instructions (Signed)

## 2016-10-06 NOTE — Therapy (Signed)
[place], [place], [address] Phone: [phone]   Fax:  [phone]  Physical Therapy Treatment  Patient Details  Name: Shirley Moody MRN: [id number] Date of Birth: October 12, 1960 Referring Provider: Dr Lynne Leader   Encounter Date: 10/06/2016      PT End of Session - 10/06/16 1153    Visit Number 3   Number of Visits 12   Date for PT Re-Evaluation 11/08/16   PT Start Time 1150   PT Stop Time 1249   PT Time Calculation (min) 59 min   Activity Tolerance Patient tolerated treatment well      Past Medical History:  Diagnosis Date  . Allergy   . Colon polyp   . Depression   . Diverticulitis   . IBS (irritable bowel syndrome)   . Osteoporosis 11/17/2015   DEXA 2017    Past Surgical History:  Procedure Laterality Date  . DILATION AND CURETTAGE OF UTERUS    . NEUROMA SURGERY     Right foot    There were no vitals filed for this visit.      Subjective Assessment - 10/06/16 1153    Subjective Pain has moved from the Rt side to the Lt side of the posterior rib cage. She has had some tingling into the Lt UE. Less pain on the Rt. Happened first thing this moring when she awoke and got out of bed.    Currently in Pain? Yes   Pain Score 6    Pain Location Thoracic   Pain Orientation Left   Pain Descriptors / Indicators Dull;Aching   Pain Type Acute pain   Pain Onset Today   Pain Frequency Constant                         [facility] Adult PT Treatment/Exercise - 10/06/16 0001      Shoulder Exercises: Standing   Retraction AROM;Strengthening;Both;15 reps;Theraband  one set woithout one with TB   Theraband Level (Shoulder Retraction) Level 1 (Yellow)   Retraction Limitations W's x 10    Other Standing Exercises scap squeeze 10 sec x 10 with noodle    Other Standing Exercises axial extension 10 sec x 5      Shoulder Exercises: Stretch   Other Shoulder Stretches 3 way  doorway stretch 30 sec x 2      Moist Heat Therapy   Number Minutes Moist Heat 20 Minutes   Moist Heat Location Shoulder  thoracic spine      Electrical Stimulation   Electrical Stimulation Location Rt pecs/ thoracic spine    Electrical Stimulation Action IFC   Electrical Stimulation Parameters to tolerance   Electrical Stimulation Goals Pain;Tone     Manual Therapy   Manual therapy comments pt prone    Soft tissue mobilization working through the thoracic paraspinals, middle and lower traps, rhomboids bilat    Myofascial Release thoracic spine           Trigger Point Dry Needling - 10/06/16 1300    Consent Given? Yes   Education Handout Provided Yes   Muscles Treated Upper Body Rhomboids;Longissimus  middle/lower traps at origin at spine bilat    Rhomboids Response Palpable increased muscle length   Longissimus Response Palpable increased muscle length              PT Education - 10/06/16 1302    Education provided Yes   Education Details HEP    Person(s)  Educated Patient   Methods Explanation   Comprehension Verbalized understanding             PT Long Term Goals - 10/04/16 0806      PT LONG TERM GOAL #1   Title I with advanced HEP 11/09/16   Time 6   Period Weeks   Status On-going     PT LONG TERM GOAL #2   Title Improve posture and alignment with patient to demonstrate improved upright posture with engagement of posterior shoudler girdle musculature 11/09/16   Time 6   Period Weeks   Status On-going     PT LONG TERM GOAL #3   Title patient reports 50-75% resloution of thoracic and Rt shoulder girdle pain 11/09/16   Time 6   Period Weeks   Status On-going     PT LONG TERM GOAL #4   Title Increase cervical ROM by 8-10 degrees in limited planes 11/09/16   Time 6   Period Weeks   Status On-going     PT LONG TERM GOAL #5   Title Improve FOTO to </= 35% limitation 11/09/16   Time 6   Period Weeks   Status On-going                Plan - 10/06/16 1157    Clinical Impression Statement Change in symptoms this am with patient reporting the primary site of the pain has moved from the Rt to Lt mid back. She still has some pain in the Rt thoracic spine but this is less intense. Some improvement in pain with DN with decreased palpable tightness noted.    PT Frequency 2x / week   PT Duration 6 weeks   PT Treatment/Interventions Patient/family education;ADLs/Self Care Home Management;Cryotherapy;Electrical Stimulation;Iontophoresis 4mg /ml Dexamethasone;Moist Heat;Ultrasound;Dry needling;Manual techniques;Therapeutic activities;Therapeutic exercise;Neuromuscular re-education   PT Next Visit Plan HEP; continue to work on postural correction; add manual work through the cervical and thoracic musculature; gentle mobs through same and ribs; modlaities as indicated assess response to DN   Consulted and Agree with Plan of Care Patient      Patient will benefit from skilled therapeutic intervention in order to improve the following deficits and impairments:  Postural dysfunction, Improper body mechanics, Pain, Increased fascial restricitons, Increased muscle spasms, Decreased range of motion, Decreased mobility, Decreased strength, Decreased activity tolerance  Visit Diagnosis: Pain in thoracic spine  Other symptoms and signs involving the musculoskeletal system  Abnormal posture     Problem List Patient Active Problem List   Diagnosis Date Noted  . Urinary stream splitting 03/20/2016  . Osteoporosis 11/17/2015  . Postmenopausal atrophic vaginitis 02/01/2015  . Vulvar lesion 02/01/2015  . Primary osteoarthritis of left knee 06/26/2014  . Impingement syndrome of right shoulder 03/11/2013  . 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 Pain at left costal margin. 11/15/2011  . ADHD (attention deficit hyperactivity disorder) 12/05/2010  . GENERALIZED ANXIETY DISORDER 02/16/2010  . PAP SMEAR, LGSIL, ABNORMAL 02/16/2010  .  DIVERTICULITIS, HX OF 01/22/2010  . ALLERGIC RHINITIS 11/16/2009  . COLONIC POLYPS, HYPERPLASTIC 01/27/2009  . DEPRESSION 02/01/2008    Cammi Consalvo Nilda Simmer PT, MPH  10/06/2016, 1:06 PM  [facility] [place], [place], [address] Phone: [phone]   Fax:  [phone]  Name: Amabel Stmarie MRN: [id number] Date of Birth: 03/21/1960

## 2016-10-09 ENCOUNTER — Ambulatory Visit (INDEPENDENT_AMBULATORY_CARE_PROVIDER_SITE_OTHER): Payer: BC Managed Care – PPO | Admitting: Physical Therapy

## 2016-10-09 DIAGNOSIS — R29898 Other symptoms and signs involving the musculoskeletal system: Secondary | ICD-10-CM | POA: Diagnosis not present

## 2016-10-09 DIAGNOSIS — R293 Abnormal posture: Secondary | ICD-10-CM | POA: Diagnosis not present

## 2016-10-09 DIAGNOSIS — M546 Pain in thoracic spine: Secondary | ICD-10-CM

## 2016-10-09 NOTE — Therapy (Signed)
[place], [place], [address] Phone: [phone]   Fax:  [phone]  Physical Therapy Treatment  Patient Details  Name: Shirley Moody MRN: [id number] Date of Birth: [birth year] Referring Provider: Dr. Lynne Leader  Encounter Date: 10/09/2016      PT End of Session - 10/09/16 1641    Visit Number 4   Number of Visits 12   Date for PT Re-Evaluation 11/08/16   PT Start Time 1603   PT Stop Time 1653   PT Time Calculation (min) 50 min   Activity Tolerance Patient tolerated treatment well   Behavior During Therapy [facility] for tasks assessed/performed      Past Medical History:  Diagnosis Date  . Allergy   . Colon polyp   . Depression   . Diverticulitis   . IBS (irritable bowel syndrome)   . Osteoporosis 11/17/2015   DEXA 2017    Past Surgical History:  Procedure Laterality Date  . DILATION AND CURETTAGE OF UTERUS    . NEUROMA SURGERY     Right foot    There were no vitals filed for this visit.      Subjective Assessment - 10/09/16 1604    Subjective Pt reports she is no longer having stabbing pain in her back. She is unsure if the DN helped or not.  She feels 70% improvement since intiating therapy, however she feels she is 60% from feeling her old self again.    Pertinent History Arthritis mid and LB; hips; Lt knee; shds; Lupus dx in June 2018; osteopenia   Patient Stated Goals get rid of pain    Currently in Pain? Yes   Pain Score 3    Pain Location Thoracic   Pain Orientation Left;Right  L>R   Pain Descriptors / Indicators Aching;Dull   Aggravating Factors  reaching, lifting, brushing and drying hair, bending over.    Pain Relieving Factors heating pad/ ice/ stretching            [facility] PT Assessment - 10/09/16 0001      Assessment   Medical Diagnosis Rt thoracic pain    Referring Provider Dr. Lynne Leader   Onset Date/Surgical Date 09/21/16   Hand Dominance Right   Next  MD Visit 10/31/16           [facility] Adult PT Treatment/Exercise - 10/09/16 0001      Shoulder Exercises: Supine   Horizontal ABduction Strengthening;Both;15 reps;Theraband   Theraband Level (Shoulder Horizontal ABduction) Level 1 (Yellow)   External Rotation Both;10 reps;Theraband  2 sets   Theraband Level (Shoulder External Rotation) Level 1 (Yellow)   Other Supine Exercises hooklying on green noodle:  snow angels x 15 reps.    Other Supine Exercises D2 flexion with yellow band x 10 each arm.      Shoulder Exercises: Prone   Retraction Strengthening;Both;12 reps  with axial ext and gentle shoulder ext.      Shoulder Exercises: ROM/Strengthening   UBE (Upper Arm Bike) L1: 3 min backward (standing)     Shoulder Exercises: Stretch   Other Shoulder Stretches 3 way doorway stretch 45 sec x 2    Other Shoulder Stretches childs pose with lateral trunk flexion x 20 sec each side; yoga thread the needle (childs pose with thoracic rotation x 20 sec each side, 2 sets to tolerance      Moist Heat Therapy   Number Minutes Moist Heat 15 Minutes   Moist Heat Location --  thoracic region     Acupuncturist Location bilat levator and rhomboids   Electrical Stimulation Action IFC   Electrical Stimulation Parameters to tolerance    Electrical Stimulation Goals Pain                     PT Long Term Goals - 10/04/16 0806      PT LONG TERM GOAL #1   Title I with advanced HEP 11/09/16   Time 6   Period Weeks   Status On-going     PT LONG TERM GOAL #2   Title Improve posture and alignment with patient to demonstrate improved upright posture with engagement of posterior shoudler girdle musculature 11/09/16   Time 6   Period Weeks   Status On-going     PT LONG TERM GOAL #3   Title patient reports 50-75% resloution of thoracic and Rt shoulder girdle pain 11/09/16   Time 6   Period Weeks   Status On-going     PT LONG TERM GOAL #4   Title  Increase cervical ROM by 8-10 degrees in limited planes 11/09/16   Time 6   Period Weeks   Status On-going     PT LONG TERM GOAL #5   Title Improve FOTO to </= 35% limitation 11/09/16   Time 6   Period Weeks   Status On-going               Plan - 10/09/16 1643    Clinical Impression Statement Pt tolerated all exercises well, with slight decrease in pain.  Pt reported further reduction in pain with use of MHP and estim to bilat mid back.  Pt reporting overall reduction of pain and improvement of mobility; progressing towards established goals.    Rehab Potential Good   PT Frequency 2x / week   PT Duration 6 weeks   PT Treatment/Interventions Patient/family education;ADLs/Self Care Home Management;Cryotherapy;Electrical Stimulation;Iontophoresis 4mg /ml Dexamethasone;Moist Heat;Ultrasound;Dry needling;Manual techniques;Therapeutic activities;Therapeutic exercise;Neuromuscular re-education   PT Next Visit Plan Continue postural strengthening.  Manual therapy and modalities as indicated.    Consulted and Agree with Plan of Care Patient      Patient will benefit from skilled therapeutic intervention in order to improve the following deficits and impairments:  Postural dysfunction, Improper body mechanics, Pain, Increased fascial restricitons, Increased muscle spasms, Decreased range of motion, Decreased mobility, Decreased strength, Decreased activity tolerance  Visit Diagnosis: Pain in thoracic spine  Other symptoms and signs involving the musculoskeletal system  Abnormal posture     Problem List Patient Active Problem List   Diagnosis Date Noted  . Urinary stream splitting 03/20/2016  . Osteoporosis 11/17/2015  . Postmenopausal atrophic vaginitis 02/01/2015  . Vulvar lesion 02/01/2015  . Primary osteoarthritis of left knee 06/26/2014  . Impingement syndrome of right shoulder 03/11/2013  . 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 Pain at left costal margin. 11/15/2011  .  ADHD (attention deficit hyperactivity disorder) 12/05/2010  . GENERALIZED ANXIETY DISORDER 02/16/2010  . PAP SMEAR, LGSIL, ABNORMAL 02/16/2010  . DIVERTICULITIS, HX OF 01/22/2010  . ALLERGIC RHINITIS 11/16/2009  . COLONIC POLYPS, HYPERPLASTIC 01/27/2009  . DEPRESSION 02/01/2008   Kerin Perna, PTA 10/09/16 4:51 PM  [place], [place], [address] Phone: [phone]   Fax:  [phone]  Name: Shulamis Wenberg MRN: [id number] Date of Birth: 10/01/1960

## 2016-10-09 NOTE — Patient Instructions (Signed)
TENS UNIT: This is helpful for muscle pain and spasm.   Search and Purchase a TENS 7000 2nd edition at www.tenspros.com. Or [email or web address] It should be less than $30.     TENS unit instructions: Do not shower or bathe with the unit on Turn the unit off before removing electrodes or batteries If the electrodes lose stickiness add a drop of water to the electrodes after they are disconnected from the unit and place on plastic sheet. If you continued to have difficulty, call the TENS unit company to purchase more electrodes. Do not apply lotion on the skin area prior to use. Make sure the skin is clean and dry as this will help prolong the life of the electrodes. After use, always check skin for unusual red areas, rash or other skin difficulties. If there are any skin problems, does not apply electrodes to the same area. Never remove the electrodes from the unit by pulling the wires. Do not use the TENS unit or electrodes other than as directed. Do not change electrode placement without consultating your therapist or physician. Keep 2 fingers with between each electrode. Wear time ratio is 2:1, on to off times.    Scapular Retraction (Prone)    Lie with arms at sides. Pinch shoulder blades together and raise arms a few inches from floor and forehead off towel.  Repeat __10__ times per set. Do __1-2__ sets per session. Do ____ sessions per day.

## 2016-10-11 ENCOUNTER — Encounter: Payer: Self-pay | Admitting: Rehabilitative and Restorative Service Providers"

## 2016-10-12 ENCOUNTER — Ambulatory Visit (INDEPENDENT_AMBULATORY_CARE_PROVIDER_SITE_OTHER): Payer: BC Managed Care – PPO | Admitting: Rehabilitative and Restorative Service Providers"

## 2016-10-12 ENCOUNTER — Encounter: Payer: Self-pay | Admitting: Rehabilitative and Restorative Service Providers"

## 2016-10-12 DIAGNOSIS — R293 Abnormal posture: Secondary | ICD-10-CM | POA: Diagnosis not present

## 2016-10-12 DIAGNOSIS — M546 Pain in thoracic spine: Secondary | ICD-10-CM

## 2016-10-12 DIAGNOSIS — R29898 Other symptoms and signs involving the musculoskeletal system: Secondary | ICD-10-CM | POA: Diagnosis not present

## 2016-10-12 NOTE — Patient Instructions (Signed)

## 2016-10-12 NOTE — Therapy (Signed)
Shirley Moody [place], [place], [address] Phone: [phone]   Fax:  [phone]  Physical Therapy Treatment  Patient Details  Name: Shirley Moody MRN: [id number] Date of Birth: 12/08/1960 Referring Provider: Dr. Lynne Leader  Encounter Date: 10/12/2016      PT End of Session - 10/12/16 0809    Visit Number 5   Number of Visits 12   Date for PT Re-Evaluation 11/08/16   PT Start Time 0803   PT Stop Time 0900   PT Time Calculation (min) 57 min   Activity Tolerance Patient tolerated treatment well      Past Medical History:  Diagnosis Date  . Allergy   . Colon polyp   . Depression   . Diverticulitis   . IBS (irritable bowel syndrome)   . Osteoporosis 11/17/2015   DEXA 2017    Past Surgical History:  Procedure Laterality Date  . DILATION AND CURETTAGE OF UTERUS    . NEUROMA SURGERY     Right foot    There were no vitals filed for this visit.      Subjective Assessment - 10/12/16 0807    Subjective Patient reports temporary relief from therapy but no significant change overall. Sees MD ~ 10/31/16. On vacation next week. Will be active using UE's next week and would like to be able to enjoy the activities without pain    Currently in Pain? Yes   Pain Score 5    Pain Location Thoracic   Pain Orientation Right;Left   Pain Descriptors / Indicators Aching;Dull   Pain Type Acute pain                         [facility] Adult PT Treatment/Exercise - 10/12/16 0001      Shoulder Exercises: Supine   Other Supine Exercises supine mobilization lying over small foam roll ~ 1 min T spine      Shoulder Exercises: Standing   Extension Strengthening;Both;15 reps;Theraband   Theraband Level (Shoulder Extension) Level 2 (Red)   Row Strengthening;Both;15 reps;Theraband   Theraband Level (Shoulder Row) Level 2 (Red)   Retraction Strengthening;Both;15 reps   Theraband Level (Shoulder Retraction)  Level 1 (Yellow)  with noodle   Other Standing Exercises scap squeeze 10 sec x 10 with noodle    Other Standing Exercises axial extension 10 sec x 5      Shoulder Exercises: ROM/Strengthening   UBE (Upper Arm Bike) L2: 4 min forward/backward (standing)     Shoulder Exercises: Stretch   Other Shoulder Stretches 3 way doorway stretch 45 sec x 2    Other Shoulder Stretches childs pose with lateral trunk flexion x 20 sec each side; yoga thread the needle (childs pose with thoracic rotation x 20 sec each side, 2 sets to tolerance      Moist Heat Therapy   Number Minutes Moist Heat 20 Minutes   Moist Heat Location --  thoracic region     Electrical Stimulation   Electrical Stimulation Location bilat levator and rhomboids   Electrical Stimulation Action IFC   Electrical Stimulation Parameters to tolerance   Electrical Stimulation Goals Pain;Tone     Ultrasound   Ultrasound Location Rt medial scapular area - traps/rhomboids   Ultrasound Parameters 1.5 w/cm2; 1 mHz; 100%; 8 min    Ultrasound Goals Pain;Other (Comment)  tightness      Manual Therapy   Manual therapy comments pt prone    Soft tissue mobilization  working through the thoracic paraspinals, middle and lower traps, rhomboids bilat    Myofascial Release thoracic spine    Passive ROM springing through ribs bialt           Trigger Point Dry Needling - 10/12/16 0831    Consent Given? Yes   Muscles Treated Upper Body Rhomboids;Longissimus  bilat    Rhomboids Response Palpable increased muscle length   Longissimus Response Palpable increased muscle length  thoracic bilat               PT Education - 10/12/16 1138    Education provided Yes   Education Details HEP   Person(s) Educated Patient   Methods Explanation;Demonstration;Tactile cues;Verbal cues;Handout   Comprehension Verbalized understanding;Returned demonstration;Verbal cues required;Tactile cues required             PT Long Term Goals -  10/12/16 0809      PT LONG TERM GOAL #1   Title I with advanced HEP 11/09/16   Time 6   Period Weeks   Status On-going     PT LONG TERM GOAL #2   Title Improve posture and alignment with patient to demonstrate improved upright posture with engagement of posterior shoudler girdle musculature 11/09/16   Time 6   Period Weeks   Status On-going     PT LONG TERM GOAL #3   Title patient reports 50-75% resloution of thoracic and Rt shoulder girdle pain 11/09/16   Time 6   Period Weeks   Status On-going     PT LONG TERM GOAL #4   Title Increase cervical ROM by 8-10 degrees in limited planes 11/09/16   Time 6   Period Weeks   Status On-going     PT LONG TERM GOAL #5   Title Improve FOTO to </= 35% limitation 11/09/16   Time 6   Period Weeks   Status On-going               Plan - 10/12/16 1138    Clinical Impression Statement Continued mid back pain Rt > Lt. Some temporary relief od symptoms with treatment but no significant change in pain. Note less palpable tightness posterior thoracic spine musculature. Only trial of DN x 2 - will continue trial of DN to assess response. Continue with stretching and postural strengthening.    Rehab Potential Good   PT Frequency 2x / week   PT Duration 6 weeks   PT Treatment/Interventions Patient/family education;ADLs/Self Care Home Management;Cryotherapy;Electrical Stimulation;Iontophoresis 4mg /ml Dexamethasone;Moist Heat;Ultrasound;Dry needling;Manual techniques;Therapeutic activities;Therapeutic exercise;Neuromuscular re-education   PT Next Visit Plan Continue postural strengthening.  Manual therapy and modalities as indicated. Consistent trial of DN    Consulted and Agree with Plan of Care Patient      Patient will benefit from skilled therapeutic intervention in order to improve the following deficits and impairments:  Postural dysfunction, Improper body mechanics, Pain, Increased fascial restricitons, Increased muscle spasms,  Decreased range of motion, Decreased mobility, Decreased strength, Decreased activity tolerance  Visit Diagnosis: Pain in thoracic spine  Other symptoms and signs involving the musculoskeletal system  Abnormal posture     Problem List Patient Active Problem List   Diagnosis Date Noted  . Urinary stream splitting 03/20/2016  . Osteoporosis 11/17/2015  . Postmenopausal atrophic vaginitis 02/01/2015  . Vulvar lesion 02/01/2015  . Primary osteoarthritis of left knee 06/26/2014  . Impingement syndrome of right shoulder 03/11/2013  . 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 Pain at left costal margin. 11/15/2011  . ADHD (attention  deficit hyperactivity disorder) 12/05/2010  . GENERALIZED ANXIETY DISORDER 02/16/2010  . PAP SMEAR, LGSIL, ABNORMAL 02/16/2010  . DIVERTICULITIS, HX OF 01/22/2010  . ALLERGIC RHINITIS 11/16/2009  . COLONIC POLYPS, HYPERPLASTIC 01/27/2009  . DEPRESSION 02/01/2008    Catlynn Grondahl P Dolores Mcgovern Pt, MPH  10/12/2016, 11:42 AM  [facility] [place], [place], [address] Phone: [phone]   Fax:  [phone]  Name: Maybel Dambrosio MRN: [id number] Date of Birth: [birth year]

## 2016-10-13 ENCOUNTER — Encounter: Payer: Self-pay | Admitting: Physical Therapy

## 2016-10-16 ENCOUNTER — Ambulatory Visit (INDEPENDENT_AMBULATORY_CARE_PROVIDER_SITE_OTHER): Payer: BC Managed Care – PPO | Admitting: Rehabilitative and Restorative Service Providers"

## 2016-10-16 ENCOUNTER — Encounter: Payer: Self-pay | Admitting: Rehabilitative and Restorative Service Providers"

## 2016-10-16 DIAGNOSIS — M546 Pain in thoracic spine: Secondary | ICD-10-CM

## 2016-10-16 DIAGNOSIS — R29898 Other symptoms and signs involving the musculoskeletal system: Secondary | ICD-10-CM

## 2016-10-16 DIAGNOSIS — R293 Abnormal posture: Secondary | ICD-10-CM | POA: Diagnosis not present

## 2016-10-16 NOTE — Patient Instructions (Signed)
  Cat / Cow Flow    Inhale, press spine toward ceiling like a Halloween cat. Keeping strength in arms and abdominals, exhale to soften spine through neutral and into cow pose. Open chest and arch back. Initiate movement between cat and cow at tailbone, one vertebrae at a time. Repeat _5-7___ times.  BACK: Child's Pose (Sciatica)    Sit in knee-chest position and reach arms forward. Separate knees for comfort. Hold position for 20-30 seconds . Repeat _2-3__ times. Do _2-3__ times per day.   Can also walk hands to side for stretch through the opposite side of trunk  20-30 sec hold 2-3 reps 2-3 times times per day

## 2016-10-16 NOTE — Therapy (Signed)
[place], [place], [address] Phone: [phone]   Fax:  [phone]  Physical Therapy Treatment  Patient Details  Name: Shirley Moody MRN: [id number] Date of Birth: [birth year] Referring Provider: Dr Lynne Leader   Encounter Date: 10/16/2016      PT End of Session - 10/16/16 0847    Visit Number 6   Number of Visits 12   Date for PT Re-Evaluation 11/08/16   PT Start Time 0845   PT Stop Time 0933   PT Time Calculation (min) 48 min   Activity Tolerance Patient tolerated treatment well      Past Medical History:  Diagnosis Date  . Allergy   . Colon polyp   . Depression   . Diverticulitis   . IBS (irritable bowel syndrome)   . Osteoporosis 11/17/2015   DEXA 2017    Past Surgical History:  Procedure Laterality Date  . DILATION AND CURETTAGE OF UTERUS    . NEUROMA SURGERY     Right foot    There were no vitals filed for this visit.      Subjective Assessment - 10/16/16 0845    Subjective Less pain in that one spot in the midback. No tlonger stabbing unless she turns a certain.    Currently in Pain? Yes   Pain Score 3    Pain Location Thoracic   Pain Orientation Right;Left   Pain Descriptors / Indicators Aching;Dull   Pain Type Acute pain            [facility] PT Assessment - 10/16/16 0001      Assessment   Medical Diagnosis Rt thoracic pain    Referring Provider Dr Lynne Leader    Onset Date/Surgical Date 09/21/16   Hand Dominance Right   Next MD Visit 10/31/16     Palpation   Spinal mobility hypomobile and tender cervical and thoracic through T8/9 CPA and lateral glides Rt > Lt    Palpation comment muscular tightness bilar Rt > Lt pecs; upper trap; leveator; rhomboids; thoracic paraspinals                     [facility] Adult PT Treatment/Exercise - 10/16/16 0001      Shoulder Exercises: Prone   Retraction Strengthening;10 reps  axial extension with scap  squeeze 1-2 sec hold      Shoulder Exercises: Standing   Extension Strengthening;Both;15 reps;Theraband   Theraband Level (Shoulder Extension) Level 2 (Red)   Row Strengthening;Both;15 reps;Theraband   Theraband Level (Shoulder Row) Level 2 (Red)   Retraction Strengthening;Both;15 reps   Theraband Level (Shoulder Retraction) Level 1 (Yellow)  with noodle   Other Standing Exercises scap squeeze 10 sec x 10 with noodle    Other Standing Exercises L's and W's x 10      Shoulder Exercises: ROM/Strengthening   UBE (Upper Arm Bike) L2: 4 min forward/backward (standing)     Shoulder Exercises: Stretch   Other Shoulder Stretches 3 way doorway stretch 45 sec x 2    Other Shoulder Stretches childs pose with lateral trunk flexion x 20 sec each side; yoga thread the needle (childs pose with thoracic rotation x 20 sec each side, 2 sets to tolerance      Moist Heat Therapy   Number Minutes Moist Heat 15 Minutes   Moist Heat Location --  thoracic region     Electrical Stimulation   Electrical Stimulation Location bilat levator and rhomboids   Electrical Stimulation  Action IFC   Electrical Stimulation Parameters to tolerance   Electrical Stimulation Goals Pain;Tone     Manual Therapy   Manual therapy comments pt prone    Soft tissue mobilization working through the thoracic paraspinals, middle and lower traps, rhomboids bilat    Myofascial Release thoracic spine    Passive ROM springing through ribs bialt           Trigger Point Dry Needling - 10/16/16 0909    Consent Given? Yes   Muscles Treated Upper Body --  bilat with estim    Rhomboids Response Palpable increased muscle length   Longissimus Response Palpable increased muscle length              PT Education - 10/16/16 0908    Education provided Yes   Education Details HEP    Person(s) Educated Patient   Methods Explanation;Demonstration;Tactile cues;Verbal cues;Handout   Comprehension Verbalized  understanding;Returned demonstration;Verbal cues required;Tactile cues required             PT Long Term Goals - 10/12/16 0809      PT LONG TERM GOAL #1   Title I with advanced HEP 11/09/16   Time 6   Period Weeks   Status On-going     PT LONG TERM GOAL #2   Title Improve posture and alignment with patient to demonstrate improved upright posture with engagement of posterior shoudler girdle musculature 11/09/16   Time 6   Period Weeks   Status On-going     PT LONG TERM GOAL #3   Title patient reports 50-75% resloution of thoracic and Rt shoulder girdle pain 11/09/16   Time 6   Period Weeks   Status On-going     PT LONG TERM GOAL #4   Title Increase cervical ROM by 8-10 degrees in limited planes 11/09/16   Time 6   Period Weeks   Status On-going     PT LONG TERM GOAL #5   Title Improve FOTO to </= 35% limitation 11/09/16   Time 6   Period Weeks   Status On-going               Plan - 10/16/16 0847    Clinical Impression Statement Less pain with functional activities and at rest. Progressing gradually toward stated goals of therapy. Good response to DN with estim    Rehab Potential Good   PT Frequency 2x / week   PT Duration 6 weeks   PT Treatment/Interventions Patient/family education;ADLs/Self Care Home Management;Cryotherapy;Electrical Stimulation;Iontophoresis 4mg /ml Dexamethasone;Moist Heat;Ultrasound;Dry needling;Manual techniques;Therapeutic activities;Therapeutic exercise;Neuromuscular re-education   PT Next Visit Plan Continue postural strengthening.  Manual therapy and modalities as indicated. Consistent trial of DN with estim       Patient will benefit from skilled therapeutic intervention in order to improve the following deficits and impairments:  Postural dysfunction, Improper body mechanics, Pain, Increased fascial restricitons, Increased muscle spasms, Decreased range of motion, Decreased mobility, Decreased strength, Decreased activity  tolerance  Visit Diagnosis: Pain in thoracic spine  Other symptoms and signs involving the musculoskeletal system  Abnormal posture     Problem List Patient Active Problem List   Diagnosis Date Noted  . Urinary stream splitting 03/20/2016  . Osteoporosis 11/17/2015  . Postmenopausal atrophic vaginitis 02/01/2015  . Vulvar lesion 02/01/2015  . Primary osteoarthritis of left knee 06/26/2014  . Impingement syndrome of right shoulder 03/11/2013  . 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 Pain at left costal margin. 11/15/2011  . ADHD (attention deficit hyperactivity disorder)  12/05/2010  . GENERALIZED ANXIETY DISORDER 02/16/2010  . PAP SMEAR, LGSIL, ABNORMAL 02/16/2010  . DIVERTICULITIS, HX OF 01/22/2010  . ALLERGIC RHINITIS 11/16/2009  . COLONIC POLYPS, HYPERPLASTIC 01/27/2009  . DEPRESSION 02/01/2008    Shelli Portilla Nilda Simmer PT, MPH  10/16/2016, 9:19 AM  [facility] [place], [place], [address] Phone: [phone]   Fax:  [phone]  Name: Shirley Moody MRN: [id number] Date of Birth: [birth year]

## 2016-10-18 ENCOUNTER — Ambulatory Visit (INDEPENDENT_AMBULATORY_CARE_PROVIDER_SITE_OTHER): Payer: BC Managed Care – PPO | Admitting: Rehabilitative and Restorative Service Providers"

## 2016-10-18 ENCOUNTER — Encounter: Payer: Self-pay | Admitting: Rehabilitative and Restorative Service Providers"

## 2016-10-18 DIAGNOSIS — M546 Pain in thoracic spine: Secondary | ICD-10-CM | POA: Diagnosis not present

## 2016-10-18 DIAGNOSIS — R29898 Other symptoms and signs involving the musculoskeletal system: Secondary | ICD-10-CM | POA: Diagnosis not present

## 2016-10-18 DIAGNOSIS — R293 Abnormal posture: Secondary | ICD-10-CM

## 2016-10-18 NOTE — Therapy (Signed)
[place], [place], [address] Phone: [phone]   Fax:  [phone]  Physical Therapy Treatment  Patient Details  Name: Shirley Moody MRN: [id number] Date of Birth: [birth year] Referring Provider: Dr Lynne Leader   Encounter Date: 10/18/2016      PT End of Session - 10/18/16 0851    Visit Number 7   Number of Visits 12   Date for PT Re-Evaluation 11/08/16   PT Start Time 0845   PT Stop Time 0941   PT Time Calculation (min) 56 min   Activity Tolerance Patient tolerated treatment well      Past Medical History:  Diagnosis Date  . Allergy   . Colon polyp   . Depression   . Diverticulitis   . IBS (irritable bowel syndrome)   . Osteoporosis 11/17/2015   DEXA 2017    Past Surgical History:  Procedure Laterality Date  . DILATION AND CURETTAGE OF UTERUS    . NEUROMA SURGERY     Right foot    There were no vitals filed for this visit.      Subjective Assessment - 10/18/16 0852    Subjective Improving. The spot where she initially had pain is much better - mostly when she moves. Some pain across the neck and Rt shoudler area but Lt feels good. Pleased with progress. Will be out of town for the next 2 weeks. Returns to MD 10/31/16. Will determine need for further treatment at that time.    Currently in Pain? Yes   Pain Score 2   0/10 at rest - 2/10 with movement    Pain Location Thoracic   Pain Orientation Right;Left   Pain Descriptors / Indicators Aching;Dull   Pain Type Acute pain   Pain Onset More than a month ago   Pain Frequency Intermittent                         [facility] Adult PT Treatment/Exercise - 10/18/16 0001      Shoulder Exercises: Prone   Retraction Strengthening;10 reps  axial extension with scap squeeze 1-2 sec hold    Other Prone Exercises w's; superman; hands clasp behind head x 10 each     Shoulder Exercises: Standing   Extension  Strengthening;Both;15 reps;Theraband   Theraband Level (Shoulder Extension) Level 2 (Red)   Row Strengthening;Both;15 reps;Theraband   Theraband Level (Shoulder Row) Level 2 (Red)   Retraction Strengthening;Both;15 reps   Theraband Level (Shoulder Retraction) Level 1 (Yellow)  with noodle   Other Standing Exercises scap squeeze 10 sec x 10 with noodle    Other Standing Exercises L's and W's x 10      Shoulder Exercises: ROM/Strengthening   UBE (Upper Arm Bike) L2: 4 min forward/backward (standing)     Shoulder Exercises: Stretch   Other Shoulder Stretches 3 way doorway stretch 45 sec x 2    Other Shoulder Stretches childs pose with lateral trunk flexion x 20 sec each side; yoga thread the needle (childs pose with thoracic rotation x 20 sec each side, 2 sets to tolerance      Moist Heat Therapy   Number Minutes Moist Heat 20 Minutes   Moist Heat Location --  thoracic region     Electrical Stimulation   Electrical Stimulation Location bilat thoracic paraspinals    Electrical Stimulation Action IFC   Electrical Stimulation Parameters to tolerance   Electrical Stimulation Goals Pain;Tone  Ultrasound   Ultrasound Location Rt/Lt thoracic paraspinals    Ultrasound Parameters 1.5 w/cm2; 1 mHz; 100%; 8 min    Ultrasound Goals Pain;Other (Comment)  muscular tightness      Manual Therapy   Manual therapy comments pt prone    Soft tissue mobilization working through the thoracic paraspinals, middle and lower traps, rhomboids bilat    Myofascial Release thoracic spine    Passive ROM springing through ribs bialt           Trigger Point Dry Needling - 10/18/16 1226    Consent Given? Yes   Muscles Treated Upper Body --  bilat with e-stim    Rhomboids Response Palpable increased muscle length   Longissimus Response Palpable increased muscle length              PT Education - 10/18/16 0901    Education provided Yes   Education Details HEP    Person(s) Educated Patient    Methods Explanation;Demonstration;Tactile cues;Verbal cues;Handout   Comprehension Verbalized understanding;Returned demonstration;Verbal cues required;Tactile cues required             PT Long Term Goals - 10/18/16 0921      PT LONG TERM GOAL #1   Title I with advanced HEP 11/09/16   Time 6   Period Weeks   Status On-going     PT LONG TERM GOAL #2   Title Improve posture and alignment with patient to demonstrate improved upright posture with engagement of posterior shoudler girdle musculature 11/09/16   Time 6   Period Weeks   Status On-going     PT LONG TERM GOAL #3   Title patient reports 50-75% resloution of thoracic and Rt shoulder girdle pain 11/09/16   Time 6   Period Weeks   Status Achieved     PT LONG TERM GOAL #4   Title Increase cervical ROM by 8-10 degrees in limited planes 11/09/16   Time 6   Period Weeks   Status On-going     PT LONG TERM GOAL #5   Title Improve FOTO to </= 35% limitation 11/09/16   Time 6   Period Weeks   Status On-going               Plan - 10/18/16 0919    Clinical Impression Statement Progressing well. Decreased pain and muscular tightness. Progressing well with goals of therapy. Patient will be out of town for almost 2 weeks. She will continue with HEP and call to schedule additional appointments as needed.    Rehab Potential Good   PT Frequency 2x / week   PT Duration 6 weeks   PT Treatment/Interventions Patient/family education;ADLs/Self Care Home Management;Cryotherapy;Electrical Stimulation;Iontophoresis 4mg /ml Dexamethasone;Moist Heat;Ultrasound;Dry needling;Manual techniques;Therapeutic activities;Therapeutic exercise;Neuromuscular re-education   PT Next Visit Plan Continue postural strengthening.  Manual therapy and modalities as indicated. Continue DN with estim (Patient will be out of town ~ 2 weeks - will call to schedule as indicated)    Consulted and Agree with Plan of Care Patient      Patient will  benefit from skilled therapeutic intervention in order to improve the following deficits and impairments:  Postural dysfunction, Improper body mechanics, Pain, Increased fascial restricitons, Increased muscle spasms, Decreased range of motion, Decreased mobility, Decreased strength, Decreased activity tolerance  Visit Diagnosis: Pain in thoracic spine  Other symptoms and signs involving the musculoskeletal system  Abnormal posture     Problem List Patient Active Problem List   Diagnosis Date Noted  . Urinary  stream splitting 03/20/2016  . Osteoporosis 11/17/2015  . Postmenopausal atrophic vaginitis 02/01/2015  . Vulvar lesion 02/01/2015  . Primary osteoarthritis of left knee 06/26/2014  . Impingement syndrome of right shoulder 03/11/2013  . 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 Pain at left costal margin. 11/15/2011  . ADHD (attention deficit hyperactivity disorder) 12/05/2010  . GENERALIZED ANXIETY DISORDER 02/16/2010  . PAP SMEAR, LGSIL, ABNORMAL 02/16/2010  . DIVERTICULITIS, HX OF 01/22/2010  . ALLERGIC RHINITIS 11/16/2009  . COLONIC POLYPS, HYPERPLASTIC 01/27/2009  . DEPRESSION 02/01/2008    Celyn Nilda Simmer PT, MPH  10/18/2016, 12:31 PM  [facility] [place], [place], [address] Phone: [phone]   Fax:  [phone]  Name: Shirley Moody MRN: [id number] Date of Birth: 21-Mar-1960

## 2016-10-18 NOTE — Patient Instructions (Addendum)
Shoulder Blade Squeeze: Fingers Interlaced    Fingers interlaced behind your body, palms facing each other. Press pelvis down. Squeeze backbone with shoulder blades. Raise front of shoulders, chest, and head. Keep neck neutral. Hold _1-2__ seconds. Relax. Repeat _5-10__ times.   Shoulder Blade Squeeze: Airplane    Arms out to sides at 90, elbows straight, palms down. Press pelvis down. Squeeze backbone with shoulder blades. Raise arms, front of shoulders, chest, and head. Keep neck neutral. Hold _1-2__ seconds. Relax. Repeat __5-10_ times.   Shoulder Blade Squeeze: W    Arms out to sides at 90 palms down. Bend elbows to 90. Press pelvis down. Squeeze backbone with shoulder blades. Raise arms, front of shoulders, chest, and head. Keep neck neutral. Hold __1-2_ seconds. Relax. Repeat _5-10__ times.

## 2016-10-31 ENCOUNTER — Ambulatory Visit: Payer: Self-pay | Admitting: Family Medicine

## 2016-11-02 ENCOUNTER — Ambulatory Visit (INDEPENDENT_AMBULATORY_CARE_PROVIDER_SITE_OTHER): Payer: BC Managed Care – PPO | Admitting: Family Medicine

## 2016-11-02 VITALS — BP 99/66 | HR 73 | Wt 123.0 lb

## 2016-11-02 DIAGNOSIS — M2021 Hallux rigidus, right foot: Secondary | ICD-10-CM | POA: Diagnosis not present

## 2016-11-02 DIAGNOSIS — M21621 Bunionette of right foot: Secondary | ICD-10-CM

## 2016-11-02 DIAGNOSIS — M7700 Medial epicondylitis, unspecified elbow: Secondary | ICD-10-CM | POA: Insufficient documentation

## 2016-11-02 DIAGNOSIS — M7918 Myalgia, other site: Secondary | ICD-10-CM

## 2016-11-02 MED ORDER — DICLOFENAC SODIUM 1 % TD GEL: 2.0000 g | Freq: Four times a day (QID) | TRANSDERMAL | 11 refills | Status: DC

## 2016-11-02 NOTE — Patient Instructions (Signed)
Thank you for coming in today. Apply voltaren gel where you have pain.  You could also use over the counter aspercream.   Attend PT.   Do the elbow and wrist exercises.  Remember to let your wrist down slowly.   Recheck with me in 6 weeks.   Return sooner if needed.     [facility] Ask your health care provider which exercises are safe for you. Do exercises exactly as told by your health care provider and adjust them as directed. It is normal to feel mild stretching, pulling, tightness, or discomfort as you do these exercises, but you should stop right away if you feel sudden pain or your pain gets worse. Do not begin these exercises until told by your health care provider. Stretching and range of motion exercises These exercises warm up your muscles and joints and improve the movement and flexibility of your elbow. Exercise A: Wrist flexors  1. Straighten your left / right elbow in front of you with your palm up. If told by your health care provider, do this stretch with your elbow bent rather than straight. 2. With your other hand, gently pull your left / right hand and fingers toward you until you feel a gentle stretch on the top of your forearm. 3. Hold this position for __________ seconds. Repeat __________ times. Complete this exercise __________ times a day. Strengthening exercises These exercises build strength and endurance in your elbow. Endurance is the ability to use your muscles for a long time, even after several repetitions. Exercise B: Wrist flexion  1. Sit with your left / right forearm palm-up and supported on a table or other surface. 2. Let your left / right wrist extend over the edge of the surface. 3. Hold a __________ weight or a piece of rubber exercise band or tubing. Slowly curl your hand up toward your forearm. Try to only move your hand and keep the rest of your arm still. 4. Hold this position for __________ seconds. 5. Slowly return to the  starting position. Repeat __________ times. Complete this exercise __________ times a day. Exercise C: Eccentric wrist flexion 1. Sit with your left / right forearm palm-up and supported on a table or other surface. 2. Let your left / right wrist extend over the edge of the surface. 3. Hold a __________ weight or a piece of rubber exercise band or tubing. 4. Use your other hand to move your left / right hand up toward your forearm. 5. Slowly return to the starting position using only your left / right hand. Repeat __________ times. Complete this exercise __________ times a day. Exercise D: Hand turns, pronation i 1. Sit with your left / right forearm supported on a table or other surface. 2. Move your wrist so your pinkie travels toward your forearm and your thumb moves away from your forearm. 3. Hold this position for __________ seconds. 4. Slowly return to the starting position. Exercise E: Hand turns, pronation ii  1. Sit with your left / right forearm supported on a table or other surface. 2. Hold a hammer in your left / right hand. ? The exercise will be easier if you hold on near the head of the hammer. ? If you hold on toward the end of the handle, the exercise will be harder. 3. Rest your left / right hand over the edge of the table with your palm facing up. 4. Without moving your elbow, slowly turn your palm and your hand  down toward the table. 5. Hold this position for __________ seconds. 6. Slowly return to the starting position. Repeat __________ times. Complete this exercise __________ times a day. Exercise F: Shoulder blade squeeze 1. Sit in a stable chair with good posture. Do not let your back touch the back of the chair. 2. Your arms should be at your sides with your elbows bent. You can rest your forearms on a pillow. 3. Squeeze your shoulder blades together. Keep your shoulders level. Do not lift your shoulders up toward your ears. 4. Hold this position for __________  seconds. 5. Slowly release. Repeat __________ times. Complete this exercise __________ times a day. This information is not intended to replace advice given to you by your health care provider. Make sure you discuss any questions you have with your health care provider. Document Released: 01/02/2005 Document Revised: 09/09/2015 Document Reviewed: 09/14/2014 Elsevier Interactive Patient Education  [employer].

## 2016-11-02 NOTE — Progress Notes (Signed)
Shirley Moody is a 56 y.o. female who presents to [facility] today for follow up throacic back pain.   Jazz was seen about 1 month ago for thoracic back pain thought to be due to Rhomboid strain. She's had multiple sessions with physical therapy and notes considerable improvement in pain. She notes that she's not entirely better would like to continue to attend physical therapy.  Additionally patient notes pain in the bilateral medial elbows. This is been ongoing now for months is worse with activity and better with rest. She denies any radiating pain weakness or numbness.  Additionally she notes pain in her right foot. She has pain at the first MTP and fifth MTP. This is worse with walking and prolonged standing and better with rest. She notes that loosefitting shoes such as flip flops seemed to help.   Past Medical History:  Diagnosis Date  . Allergy   . Colon polyp   . Depression   . Diverticulitis   . IBS (irritable bowel syndrome)   . Osteoporosis 11/17/2015   DEXA 2017   Past Surgical History:  Procedure Laterality Date  . DILATION AND CURETTAGE OF UTERUS    . NEUROMA SURGERY     Right foot   Social History  Substance Use Topics  . Smoking status: Former Research scientist (life sciences)  . Smokeless tobacco: Never Used  . Alcohol use 0.0 oz/week     Comment: 1 drink/month     ROS:  As above   Medications: Current Outpatient Prescriptions  Medication Sig Dispense Refill  . alendronate (FOSAMAX) 70 MG tablet Take 1 tablet (70 mg total) by mouth once a week. Take with a full glass of water on an empty stomach. 12 tablet 3  . buPROPion (WELLBUTRIN XL) 150 MG 24 hr tablet Take 450 mg by mouth daily.     . Calcium Carbonate (CALCIUM 600 PO) Take 600 mg by mouth.    . cetirizine (ZYRTEC) 10 MG tablet Take 10 mg by mouth daily.      . cyclobenzaprine (FLEXERIL) 5 MG tablet Take 1-2 tablets (5-10 mg total) by mouth at bedtime as needed for  muscle spasms. 30 tablet 1  . diclofenac sodium (VOLTAREN) 1 % GEL Apply 2 g topically 4 (four) times daily. To affected joint. 100 g 11  . Estradiol 10 MCG TABS vaginal tablet Insert one tablet vaginally twice a week. 30 tablet 2  . FLUoxetine (PROZAC) 10 MG capsule TK 1 C PO D  2  . Magnesium 250 MG TABS Take by mouth.    . mometasone (NASONEX) 50 MCG/ACT nasal spray SHAKE LIQUID AND USE 1 SPRAY IN EACH NOSTRIL EVERY DAY AS DIRECTED 17 g 4  . Probiotic Product (PROBIOTIC PO) Take by mouth.    . traZODone (DESYREL) 100 MG tablet      No current facility-administered medications for this visit.    Allergies  Allergen Reactions  . Biaxin [Clarithromycin]     "gives me lockjaw"  . Concerta [Methylphenidate] Other (See Comments)    Says affected her sleep, made her jittery and would suck in her lips  . Penicillins     "amoxicillin doesn't do anything for me anymore so I don't want it'      Exam:  BP 99/66   Pulse 73   Wt 123 lb (55.8 kg)   LMP 04/05/2011   BMI 21.11 kg/m  General: Well Developed, well nourished, and in no acute distress.  Neuro/Psych: Alert and oriented  x3, extra-ocular muscles intact, able to move all 4 extremities, sensation grossly intact. Skin: Warm and dry, no rashes noted.  Respiratory: Not using accessory muscles, speaking in full sentences, trachea midline.  Cardiovascular: Pulses palpable, no extremity edema. Abdomen: Does not appear distended. MSK:  Thoracic spine nontender to midline. Normal motion.  Elbows bilaterally normal appearing normal motion. Tender palpation medial epicondyle. Pain with resisted wrist flexion. Grip strength pulses capillary refill and sensation intact distally.  Right foot slight bunion and bunionette deformity present. Decreased motion first MTP. Otherwise unremarkable. Pulses capillary refill sensation intact distally.    No results found for this or any previous visit (from the past 48 hour(s)). No results  found.    Assessment and Plan: 56 y.o. female with  Thoracic back pain due to rhomboid spasm and strain. Improving. Continue physical therapy recheck in 6 weeks.  Medial epicondylitis bilaterally: Plan for home physical therapy exercises, formal physical therapy, and topical diclofenac gel. Recheck in 6 weeks.  Right foot pain is hallux rigidus and bunionette. Plan for well fitting sneakers and recheck in 6 weeks.    Orders Placed This Encounter  Procedures  . Ambulatory referral to Physical Therapy    Referral Priority:   Routine    Referral Type:   Physical Medicine    Referral Reason:   Specialty Services Required    Requested Specialty:   Physical Therapy   Meds ordered this encounter  Medications  . diclofenac sodium (VOLTAREN) 1 % GEL    Sig: Apply 2 g topically 4 (four) times daily. To affected joint.    Dispense:  100 g    Refill:  11    Discussed warning signs or symptoms. Please see discharge instructions. Patient expresses understanding.

## 2016-11-03 ENCOUNTER — Other Ambulatory Visit: Payer: Self-pay | Admitting: Family Medicine

## 2016-11-06 ENCOUNTER — Encounter: Payer: Self-pay | Admitting: Rehabilitative and Restorative Service Providers"

## 2016-11-06 ENCOUNTER — Ambulatory Visit: Payer: Self-pay | Admitting: Sports Medicine

## 2016-11-06 ENCOUNTER — Ambulatory Visit (INDEPENDENT_AMBULATORY_CARE_PROVIDER_SITE_OTHER): Payer: BC Managed Care – PPO | Admitting: Rehabilitative and Restorative Service Providers"

## 2016-11-06 DIAGNOSIS — R293 Abnormal posture: Secondary | ICD-10-CM

## 2016-11-06 DIAGNOSIS — R29898 Other symptoms and signs involving the musculoskeletal system: Secondary | ICD-10-CM | POA: Diagnosis not present

## 2016-11-06 DIAGNOSIS — M546 Pain in thoracic spine: Secondary | ICD-10-CM

## 2016-11-06 NOTE — Therapy (Signed)
[place], [place], [address] Phone: [phone]   Fax:  [phone]  Physical Therapy Treatment  Patient Details  Name: Shirley Moody MRN: [id number] Date of Birth: 12-26-1960 Referring Provider: Dr Lynne Leader  Encounter Date: 11/06/2016      PT End of Session - 11/06/16 0723    Visit Number 8   Number of Visits 20   Date for PT Re-Evaluation 12/18/16   PT Start Time 4259   PT Stop Time 0814   PT Time Calculation (min) 57 min   Activity Tolerance Patient tolerated treatment well      Past Medical History:  Diagnosis Date  . Allergy   . Colon polyp   . Depression   . Diverticulitis   . IBS (irritable bowel syndrome)   . Osteoporosis 11/17/2015   DEXA 2017    Past Surgical History:  Procedure Laterality Date  . DILATION AND CURETTAGE OF UTERUS    . NEUROMA SURGERY     Right foot    There were no vitals filed for this visit.      Subjective Assessment - 11/06/16 0725    Subjective Continued improvement - gradually. She is pleased with her progress. She wants to continue with PT to further resolve pain and symptoms.    Currently in Pain? Yes   Pain Score 3    Pain Location Thoracic   Pain Orientation Right;Left   Pain Descriptors / Indicators Aching;Dull   Pain Type Acute pain   Pain Onset More than a month ago   Pain Frequency Intermittent   Aggravating Factors  prolonged standing or sitting; reaching; lifting; bending forward   Pain Relieving Factors heat, ice, stretching             [facility] PT Assessment - 11/06/16 0001      Assessment   Medical Diagnosis Rt thoracic pain    Referring Provider Dr Lynne Leader   Onset Date/Surgical Date 09/21/16   Hand Dominance Right   Next MD Visit 12/14/16     AROM   Cervical Flexion 59   Cervical Extension 56   Cervical - Right Side Bend 35   Cervical - Left Side Bend 26   Cervical - Right Rotation 68   Cervical - Left  Rotation 65     Palpation   Spinal mobility hypomobile and tender cervical and thoracic through T8/9 CPA and lateral glides Rt > Lt    Palpation comment muscular tightness bilar Rt > Lt pecs; upper trap; leveator; rhomboids; thoracic paraspinals                     [facility] Adult PT Treatment/Exercise - 11/06/16 0001      Shoulder Exercises: Prone   Retraction Strengthening;10 reps  axial extension with scap squeeze 1-2 sec hold    Other Prone Exercises w's; superman; hands clasp behind head x 10 each     Shoulder Exercises: Standing   Other Standing Exercises scap squeeze 10 sec x 10 with noodle    Other Standing Exercises L's and W's x 10      Shoulder Exercises: Stretch   Other Shoulder Stretches 3 way doorway stretch 45 sec x 2    Other Shoulder Stretches childs pose with lateral trunk flexion x 20 sec each side; yoga thread the needle (childs pose with thoracic rotation x 20 sec each side, 2 sets to tolerance      Moist Heat Therapy  Number Minutes Moist Heat 20 Minutes   Moist Heat Location --  thoracic region     Electrical Stimulation   Electrical Stimulation Location bilat thoracic paraspinals    Electrical Stimulation Action IFC   Electrical Stimulation Parameters to tolerance   Electrical Stimulation Goals Pain;Tone     Ultrasound   Ultrasound Location Rt/Lt thoracic paraspinals    Ultrasound Parameters 1.5 w/cm2; 1 mHz; 100%; 8 min    Ultrasound Goals Pain;Other (Comment)  muscular tightness      Manual Therapy   Manual therapy comments pt prone    Soft tissue mobilization working through the thoracic paraspinals, middle and lower traps, rhomboids bilat    Myofascial Release thoracic spine    Passive ROM springing through ribs bialt                      PT Long Term Goals - 11/06/16 0724      PT LONG TERM GOAL #1   Title I with advanced HEP 12/18/16   Time 12   Period Weeks   Status Revised     PT LONG TERM GOAL #2   Title  Improve posture and alignment with patient to demonstrate improved upright posture with engagement of posterior shoudler girdle musculature 12/18/16   Time 12   Period Weeks   Status Revised     PT LONG TERM GOAL #3   Title patient reports 75-95% resloution of thoracic and Rt shoulder girdle pain 12/18/16   Time 12   Period Weeks   Status Revised     PT LONG TERM GOAL #4   Title Increase cervical ROM by 8-10 degrees in limited planes 12/18/16   Time 12   Period Weeks   Status Revised     PT LONG TERM GOAL #5   Title Improve FOTO to </= 35% limitation 12/18/16   Time 12   Period Weeks   Status Revised               Plan - 11/06/16 0800    Clinical Impression Statement Gradual porgression continues with patient reporting decrease tightness and discomfort; improved functional activity. She demonstrates increased in some cervical mobilty. She has continued muscular tightness through bilat thoracic parasspinals. Patient will benefit from continued therapy to accomplish goals.    Rehab Potential Good   PT Frequency 2x / week   PT Duration 12 weeks   PT Treatment/Interventions Patient/family education;ADLs/Self Care Home Management;Cryotherapy;Electrical Stimulation;Iontophoresis 4mg /ml Dexamethasone;Moist Heat;Ultrasound;Dry needling;Manual techniques;Therapeutic activities;Therapeutic exercise;Neuromuscular re-education   PT Next Visit Plan Continue postural strengthening.  Manual therapy and modalities as indicated. Continue DN with estim prn    Consulted and Agree with Plan of Care Patient      Patient will benefit from skilled therapeutic intervention in order to improve the following deficits and impairments:  Postural dysfunction, Improper body mechanics, Pain, Increased fascial restricitons, Increased muscle spasms, Decreased range of motion, Decreased mobility, Decreased strength, Decreased activity tolerance  Visit Diagnosis: Pain in thoracic spine - Plan: PT plan of care  cert/re-cert  Other symptoms and signs involving the musculoskeletal system - Plan: PT plan of care cert/re-cert  Abnormal posture - Plan: PT plan of care cert/re-cert     Problem List Patient Active Problem List   Diagnosis Date Noted  . Medial epicondylitis 11/02/2016  . Hallux rigidus of right foot 11/02/2016  . Bunionette of right foot 11/02/2016  . Urinary stream splitting 03/20/2016  . Osteoporosis 11/17/2015  . Postmenopausal atrophic vaginitis  02/01/2015  . Vulvar lesion 02/01/2015  . Primary osteoarthritis of left knee 06/26/2014  . Impingement syndrome of right shoulder 03/11/2013  . 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 Pain at left costal margin. 11/15/2011  . ADHD (attention deficit hyperactivity disorder) 12/05/2010  . GENERALIZED ANXIETY DISORDER 02/16/2010  . PAP SMEAR, LGSIL, ABNORMAL 02/16/2010  . DIVERTICULITIS, HX OF 01/22/2010  . ALLERGIC RHINITIS 11/16/2009  . COLONIC POLYPS, HYPERPLASTIC 01/27/2009  . DEPRESSION 02/01/2008    Dewey Neukam Nilda Simmer PT, MPH  11/06/2016, 8:04 AM  [facility] [place], [place], [address] Phone: [phone]   Fax:  [phone]  Name: Tylene Quashie MRN: [id number] Date of Birth: 04/01/1960

## 2016-11-08 ENCOUNTER — Ambulatory Visit (INDEPENDENT_AMBULATORY_CARE_PROVIDER_SITE_OTHER): Payer: BC Managed Care – PPO | Admitting: Rehabilitative and Restorative Service Providers"

## 2016-11-08 ENCOUNTER — Encounter: Payer: Self-pay | Admitting: Rehabilitative and Restorative Service Providers"

## 2016-11-08 DIAGNOSIS — M546 Pain in thoracic spine: Secondary | ICD-10-CM

## 2016-11-08 DIAGNOSIS — R293 Abnormal posture: Secondary | ICD-10-CM | POA: Diagnosis not present

## 2016-11-08 DIAGNOSIS — R29898 Other symptoms and signs involving the musculoskeletal system: Secondary | ICD-10-CM

## 2016-11-08 NOTE — Therapy (Signed)
[place], [place], [address] Phone: [phone]   Fax:  [phone]  Physical Therapy Treatment  Patient Details  Name: Tahisha Hakim MRN: [id number] Date of Birth: [birth year] Referring Provider: Dr Lynne Leader  Encounter Date: 11/08/2016      PT End of Session - 11/08/16 0721    Visit Number 9   Number of Visits 20   Date for PT Re-Evaluation 12/18/16   PT Start Time 0716   PT Stop Time 0814   PT Time Calculation (min) 58 min   Activity Tolerance Patient tolerated treatment well      Past Medical History:  Diagnosis Date  . Allergy   . Colon polyp   . Depression   . Diverticulitis   . IBS (irritable bowel syndrome)   . Osteoporosis 11/17/2015   DEXA 2017    Past Surgical History:  Procedure Laterality Date  . DILATION AND CURETTAGE OF UTERUS    . NEUROMA SURGERY     Right foot    There were no vitals filed for this visit.      Subjective Assessment - 11/08/16 0720    Subjective Some soreness and pain this morning - not sure why. She has not done anything different that she can think may have caused increase in symptoms. Lt shoulder area seems to be getting worse,    Currently in Pain? Yes   Pain Score 4    Pain Location Thoracic   Pain Orientation Right;Left   Pain Descriptors / Indicators Aching;Dull   Pain Type Acute pain   Pain Onset More than a month ago   Pain Frequency Intermittent                         [facility] Adult PT Treatment/Exercise - 11/08/16 0001      Shoulder Exercises: Prone   Retraction Strengthening;10 reps  axial extension with scap squeeze 1-2 sec hold    Other Prone Exercises w's; superman; hands clasp behind head x 10 each     Shoulder Exercises: Standing   Extension Strengthening;Both;15 reps;Theraband   Theraband Level (Shoulder Extension) Level 3 (Green)   Row Strengthening;Both;15 reps;Theraband   Theraband Level  (Shoulder Row) Level 3 (Green)   Retraction Strengthening;Both;15 reps   Theraband Level (Shoulder Retraction) Level 2 (Red)   Other Standing Exercises scap squeeze 10 sec x 10 with noodle    Other Standing Exercises L's and W's x 10      Shoulder Exercises: ROM/Strengthening   UBE (Upper Arm Bike) L3: 4 min forward/backward (standing)     Shoulder Exercises: Stretch   Other Shoulder Stretches 3 way doorway stretch 45 sec x 2    Other Shoulder Stretches childs pose with lateral trunk flexion x 20 sec each side; yoga thread the needle (childs pose with thoracic rotation x 20 sec x 1; sitting lateral trunk flexion over pillows 30-45 sec x 1      Moist Heat Therapy   Number Minutes Moist Heat 20 Minutes   Moist Heat Location --  thoracic region     Electrical Stimulation   Electrical Stimulation Location bilat thoracic paraspinals    Electrical Stimulation Action IFC   Electrical Stimulation Parameters to tolerance   Electrical Stimulation Goals Pain;Tone     Ultrasound   Ultrasound Location Rt/Lt thoracic paraspinals   Ultrasound Parameters 1.5 w/cm2; 1 mHz; 100%; 8 min    Ultrasound Goals Pain;Other (Comment)  muscular  tightness      Manual Therapy   Manual therapy comments pt prone    Soft tissue mobilization working through the thoracic paraspinals, middle and lower traps, rhomboids bilat    Myofascial Release thoracic spine    Passive ROM springing through ribs bialt                 PT Education - 11/08/16 0759    Education provided Yes   Education Details HEP    Person(s) Educated Patient   Methods Explanation;Demonstration;Tactile cues;Verbal cues;Handout   Comprehension Verbalized understanding;Returned demonstration;Verbal cues required;Tactile cues required             PT Long Term Goals - 11/06/16 0724      PT LONG TERM GOAL #1   Title I with advanced HEP 12/18/16   Time 12   Period Weeks   Status Revised     PT LONG TERM GOAL #2   Title  Improve posture and alignment with patient to demonstrate improved upright posture with engagement of posterior shoudler girdle musculature 12/18/16   Time 12   Period Weeks   Status Revised     PT LONG TERM GOAL #3   Title patient reports 75-95% resloution of thoracic and Rt shoulder girdle pain 12/18/16   Time 12   Period Weeks   Status Revised     PT LONG TERM GOAL #4   Title Increase cervical ROM by 8-10 degrees in limited planes 12/18/16   Time 12   Period Weeks   Status Revised     PT LONG TERM GOAL #5   Title Improve FOTO to </= 35% limitation 12/18/16   Time 12   Period Weeks   Status Revised               Plan - 11/08/16 4401    Clinical Impression Statement Flare up of symptoms with no known cause. Patient reports compliance with HEP. She continues to have muscular tightness and tenderness through the thoracic paraspinals and Lt posterior shoulder girdle.    Rehab Potential Good   PT Frequency 2x / week   PT Duration 12 weeks   PT Treatment/Interventions Patient/family education;ADLs/Self Care Home Management;Cryotherapy;Electrical Stimulation;Iontophoresis 4mg /ml Dexamethasone;Moist Heat;Ultrasound;Dry needling;Manual techniques;Therapeutic activities;Therapeutic exercise;Neuromuscular re-education   PT Next Visit Plan Continue postural strengthening.  Manual therapy and modalities as indicated. Continue DN with estim prn    Consulted and Agree with Plan of Care Patient      Patient will benefit from skilled therapeutic intervention in order to improve the following deficits and impairments:  Postural dysfunction, Improper body mechanics, Pain, Increased fascial restricitons, Increased muscle spasms, Decreased range of motion, Decreased mobility, Decreased strength, Decreased activity tolerance  Visit Diagnosis: Pain in thoracic spine  Other symptoms and signs involving the musculoskeletal system  Abnormal posture     Problem List Patient Active Problem  List   Diagnosis Date Noted  . Medial epicondylitis 11/02/2016  . Hallux rigidus of right foot 11/02/2016  . Bunionette of right foot 11/02/2016  . Urinary stream splitting 03/20/2016  . Osteoporosis 11/17/2015  . Postmenopausal atrophic vaginitis 02/01/2015  . Vulvar lesion 02/01/2015  . Primary osteoarthritis of left knee 06/26/2014  . Impingement syndrome of right shoulder 03/11/2013  . 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 Pain at left costal margin. 11/15/2011  . ADHD (attention deficit hyperactivity disorder) 12/05/2010  . GENERALIZED ANXIETY DISORDER 02/16/2010  . PAP SMEAR, LGSIL, ABNORMAL 02/16/2010  . DIVERTICULITIS, HX OF 01/22/2010  . ALLERGIC  RHINITIS 11/16/2009  . COLONIC POLYPS, HYPERPLASTIC 01/27/2009  . DEPRESSION 02/01/2008    Glenford Garis Nilda Simmer  PT, MPH  11/08/2016, 8:00 AM  [facility] [place], [place], [address] Phone: [phone]   Fax:  [phone]  Name: Aidaly Cordner MRN: [id number] Date of Birth: 1960-05-26

## 2016-11-08 NOTE — Patient Instructions (Addendum)
Side Bend, Sitting    Sit cross-legged on floor. Bend to one side, touching elbow to floor. Hold _30-60__ seconds. To increase stretch, raise other arm above head.  Repeat 2-3___ times per session. Do _1-2__ sessions per day.

## 2016-11-13 ENCOUNTER — Encounter: Payer: Self-pay | Admitting: Rehabilitative and Restorative Service Providers"

## 2016-11-15 ENCOUNTER — Encounter: Payer: Self-pay | Admitting: Rehabilitative and Restorative Service Providers"

## 2016-11-16 ENCOUNTER — Encounter: Payer: Self-pay | Admitting: Rehabilitative and Restorative Service Providers"

## 2016-11-17 ENCOUNTER — Encounter: Payer: Self-pay | Admitting: Rehabilitative and Restorative Service Providers"

## 2016-11-17 ENCOUNTER — Ambulatory Visit (INDEPENDENT_AMBULATORY_CARE_PROVIDER_SITE_OTHER): Payer: BC Managed Care – PPO | Admitting: Rehabilitative and Restorative Service Providers"

## 2016-11-17 DIAGNOSIS — R29898 Other symptoms and signs involving the musculoskeletal system: Secondary | ICD-10-CM | POA: Diagnosis not present

## 2016-11-17 DIAGNOSIS — M546 Pain in thoracic spine: Secondary | ICD-10-CM | POA: Diagnosis not present

## 2016-11-17 DIAGNOSIS — R293 Abnormal posture: Secondary | ICD-10-CM

## 2016-11-17 NOTE — Therapy (Signed)
[place], [place], [address] Phone: [phone]   Fax:  [phone]  Physical Therapy Treatment  Patient Details  Name: Shirley Moody MRN: [id number] Date of Birth: February 05, 1960 Referring Provider: Dr Lynne Leader   Encounter Date: 11/17/2016      PT End of Session - 11/17/16 0932    Visit Number 10   Number of Visits 20   Date for PT Re-Evaluation 12/18/16   PT Start Time 0930   PT Stop Time 1027   PT Time Calculation (min) 57 min   Activity Tolerance Patient tolerated treatment well      Past Medical History:  Diagnosis Date  . Allergy   . Colon polyp   . Depression   . Diverticulitis   . IBS (irritable bowel syndrome)   . Osteoporosis 11/17/2015   DEXA 2017    Past Surgical History:  Procedure Laterality Date  . DILATION AND CURETTAGE OF UTERUS    . NEUROMA SURGERY     Right foot    There were no vitals filed for this visit.      Subjective Assessment - 11/17/16 0933    Subjective Has been doing well. Noticed a little increase in pain in the past couple of days. She is doing her exercises at home and using the TENS unit. Pleased with progress.     Currently in Pain? Yes   Pain Score 2    Pain Location Thoracic   Pain Orientation Right;Left   Pain Descriptors / Indicators Aching;Dull   Pain Type Acute pain   Pain Onset More than a month ago            [facility] PT Assessment - 11/17/16 0001      Assessment   Medical Diagnosis Rt thoracic pain    Referring Provider Dr Lynne Leader    Onset Date/Surgical Date 09/21/16   Hand Dominance Right   Next MD Visit 12/14/16     Observation/Other Assessments   Focus on Therapeutic Outcomes (FOTO)  51% limitation      Posture/Postural Control   Posture Comments improving posture with less head forward; shoudlers rounded and elevated; increased thoracic kyphosis; scapulae abducted and rotated along the thoracic wall      AROM    Right/Left Shoulder --  WFL's bilat pain Lt end range flex; IR; horiz abd      Strength   Overall Strength Comments 5/5 bilat UE's      Palpation   Palpation comment muscular tightness Lt anterior shoulder through pecs/biceps; bilat Rt > Lt pecs; upper trap; leveator; rhomboids; thoracic paraspinals                     [facility] Adult PT Treatment/Exercise - 11/17/16 0001      Shoulder Exercises: Standing   Extension Strengthening;Both;15 reps;Theraband   Theraband Level (Shoulder Extension) Level 3 (Green)   Row Strengthening;Both;15 reps;Theraband   Theraband Level (Shoulder Row) Level 3 (Green)   Retraction Strengthening;Both;15 reps   Theraband Level (Shoulder Retraction) Level 2 (Red)   Other Standing Exercises scap squeeze 10 sec x 10 with noodle    Other Standing Exercises L's and W's x 10      Shoulder Exercises: ROM/Strengthening   UBE (Upper Arm Bike) L3: 4 min forward/backward (standing)     Shoulder Exercises: Stretch   Star Gazer Stretch --  horizontal abduction Lt UE 90 deg abd at wall 30 sec x 2  Other Shoulder Stretches 3 way doorway stretch 45 sec x 2    Other Shoulder Stretches childs pose with lateral trunk flexion x 20 sec each side; yoga thread the needle (childs pose with thoracic rotation x 20 sec x 1; sitting lateral trunk flexion over pillows 30-45 sec x 1      Moist Heat Therapy   Number Minutes Moist Heat 20 Minutes   Moist Heat Location --  thoracic region     Electrical Stimulation   Electrical Stimulation Location bilat thoracic paraspinals    Electrical Stimulation Action IFC    Electrical Stimulation Parameters to tolerance   Electrical Stimulation Goals Pain;Tone     Ultrasound   Ultrasound Location Rt/Lt thoracic paraspinals    Ultrasound Parameters 1.5 w/cm2; 1 mHz; 100%; 8 min    Ultrasound Goals Pain;Other (Comment)  muscular tightness      Manual Therapy   Manual therapy comments pt prone    Soft tissue mobilization  working through the thoracic paraspinals, middle and lower traps, rhomboids bilat    Myofascial Release thoracic spine    Passive ROM springing through ribs bialt           Trigger Point Dry Needling - 11/17/16 1010    Consent Given? Yes   Muscles Treated Upper Body --  bilat estim   Rhomboids Response Palpable increased muscle length   Longissimus Response Palpable increased muscle length                   PT Long Term Goals - 11/17/16 0934      PT LONG TERM GOAL #1   Title I with advanced HEP 12/18/16   Time 12   Period Weeks   Status On-going     PT LONG TERM GOAL #2   Title Improve posture and alignment with patient to demonstrate improved upright posture with engagement of posterior shoudler girdle musculature 12/18/16   Time 12   Period Weeks   Status On-going     PT LONG TERM GOAL #3   Title patient reports 75-95% resloution of thoracic and Rt shoulder girdle pain 12/18/16   Time 12   Period Weeks   Status On-going     PT LONG TERM GOAL #4   Title Increase cervical ROM by 8-10 degrees in limited planes 12/18/16   Time 12   Period Weeks   Status On-going     PT LONG TERM GOAL #5   Title Improve FOTO to </= 35% limitation 12/18/16   Time 12   Period Weeks   Status On-going             Patient will benefit from skilled therapeutic intervention in order to improve the following deficits and impairments:     Visit Diagnosis: Pain in thoracic spine  Other symptoms and signs involving the musculoskeletal system  Abnormal posture     Problem List Patient Active Problem List   Diagnosis Date Noted  . Medial epicondylitis 11/02/2016  . Hallux rigidus of right foot 11/02/2016  . Bunionette of right foot 11/02/2016  . Urinary stream splitting 03/20/2016  . Osteoporosis 11/17/2015  . Postmenopausal atrophic vaginitis 02/01/2015  . Vulvar lesion 02/01/2015  . Primary osteoarthritis of left knee 06/26/2014  . Impingement syndrome of right  shoulder 03/11/2013  . 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 Pain at left costal margin. 11/15/2011  . ADHD (attention deficit hyperactivity disorder) 12/05/2010  . GENERALIZED ANXIETY DISORDER 02/16/2010  . PAP SMEAR, LGSIL, ABNORMAL 02/16/2010  .  DIVERTICULITIS, HX OF 01/22/2010  . ALLERGIC RHINITIS 11/16/2009  . COLONIC POLYPS, HYPERPLASTIC 01/27/2009  . DEPRESSION 02/01/2008    Celyn Nilda Simmer PT, MPH  11/17/2016, 10:12 AM  [facility] [place], [place], [address] Phone: [phone]   Fax:  [phone]  Name: Shirley Moody MRN: [id number] Date of Birth: 1960/06/01

## 2016-11-20 ENCOUNTER — Encounter: Payer: Self-pay | Admitting: Rehabilitative and Restorative Service Providers"

## 2016-11-20 ENCOUNTER — Ambulatory Visit (INDEPENDENT_AMBULATORY_CARE_PROVIDER_SITE_OTHER): Payer: BC Managed Care – PPO | Admitting: Rehabilitative and Restorative Service Providers"

## 2016-11-20 DIAGNOSIS — R29898 Other symptoms and signs involving the musculoskeletal system: Secondary | ICD-10-CM | POA: Diagnosis not present

## 2016-11-20 DIAGNOSIS — M546 Pain in thoracic spine: Secondary | ICD-10-CM

## 2016-11-20 DIAGNOSIS — R293 Abnormal posture: Secondary | ICD-10-CM | POA: Diagnosis not present

## 2016-11-20 NOTE — Therapy (Signed)
[place], [place], [address] Phone: [phone]   Fax:  [phone]  Physical Therapy Treatment  Patient Details  Name: Shirley Moody MRN: [id number] Date of Birth: 10/04/1960 Referring Provider: Dr Lynne Leader    Encounter Date: 11/20/2016  PT End of Session - 11/20/16 0949    Visit Number  11    Number of Visits  20    Date for PT Re-Evaluation  12/18/16    PT Start Time  0946 pt 16 min late for appt    pt 16 min late for appt    PT Stop Time  1032    PT Time Calculation (min)  46 min    Activity Tolerance  Patient tolerated treatment well       Past Medical History:  Diagnosis Date  . Allergy   . Colon polyp   . Depression   . Diverticulitis   . IBS (irritable bowel syndrome)   . Osteoporosis 11/17/2015   DEXA 2017    Past Surgical History:  Procedure Laterality Date  . DILATION AND CURETTAGE OF UTERUS    . NEUROMA SURGERY     Right foot    There were no vitals filed for this visit.  Subjective Assessment - 11/20/16 0950    Subjective  Kink in her neck today on the Lt 6/10. Tried a new yoga class yesterday and that may have irritated her neck.     Currently in Pain?  Yes    Pain Score  3     Pain Location  Thoracic    Pain Orientation  Right;Left    Pain Descriptors / Indicators  Aching;Dull    Pain Type  Acute pain    Pain Onset  More than a month ago    Pain Frequency  Intermittent                      [facility] Adult PT Treatment/Exercise - 11/20/16 0001      Moist Heat Therapy   Number Minutes Moist Heat  20 Minutes    Moist Heat Location  Cervical thoracic region   thoracic region     Electrical Stimulation   Electrical Stimulation Location  Lt posterior cervical and thoracic     Electrical Stimulation Action  IFC'    Electrical Stimulation Parameters  to tolerance    Electrical Stimulation Goals  Pain;Tone      Ultrasound   Ultrasound Location   Rt/Lt thoracic paraspinals; Lt posteriorlateral cervical musculature     Ultrasound Parameters  1.5 w/cm2; 1 mHz; 100%; 9 min     Ultrasound Goals  Pain;Other (Comment) muscular tightness    muscular tightness      Manual Therapy   Manual therapy comments  pt prone     Joint Mobilization  thoracic CPA and lateral mobs tight Rt > Lt ~ T4/5 area     Soft tissue mobilization  working through [employer] cervical musculature and thoracic paraspinals, middle and lower traps, rhomboids bilat     Myofascial Release  thoracic spine     Passive ROM  springing through ribs bialt                   PT Long Term Goals - 11/17/16 0934      PT LONG TERM GOAL #1   Title  I with advanced HEP 12/18/16    Time  12    Period  Weeks  Status  On-going      PT LONG TERM GOAL #2   Title  Improve posture and alignment with patient to demonstrate improved upright posture with engagement of posterior shoudler girdle musculature 12/18/16    Time  12    Period  Weeks    Status  On-going      PT LONG TERM GOAL #3   Title  patient reports 75-95% resloution of thoracic and Rt shoulder girdle pain 12/18/16    Time  12    Period  Weeks    Status  On-going      PT LONG TERM GOAL #4   Title  Increase cervical ROM by 8-10 degrees in limited planes 12/18/16    Time  12    Period  Weeks    Status  On-going      PT LONG TERM GOAL #5   Title  Improve FOTO to </= 35% limitation 12/18/16    Time  12    Period  Weeks    Status  On-going            Plan - 11/20/16 1021    Clinical Impression Statement  Tawonna presents today with some increase in thoracic pain as well as Lt posterior/lateral cervical musculature. She has palpable tightness through the Lt cervical paraspinals; scaleni; upper trap and thoracic paraspinals/middle and lower traps. She arrived late for therapy. Focus was on modalities and manual work with patient to work on current exercise program at home.     Rehab Potential   Good    PT Frequency  2x / week    PT Duration  12 weeks    PT Treatment/Interventions  Patient/family education;ADLs/Self Care Home Management;Cryotherapy;Electrical Stimulation;Iontophoresis 4mg /ml Dexamethasone;Moist Heat;Ultrasound;Dry needling;Manual techniques;Therapeutic activities;Therapeutic exercise;Neuromuscular re-education    PT Next Visit Plan  Continue postural strengthening.  Manual therapy and modalities as indicated. Continue DN with estim prn - assess ROM at next visit.     Consulted and Agree with Plan of Care  Patient       Patient will benefit from skilled therapeutic intervention in order to improve the following deficits and impairments:  Postural dysfunction, Improper body mechanics, Pain, Increased fascial restricitons, Increased muscle spasms, Decreased range of motion, Decreased mobility, Decreased strength, Decreased activity tolerance  Visit Diagnosis: Pain in thoracic spine  Other symptoms and signs involving the musculoskeletal system  Abnormal posture     Problem List Patient Active Problem List   Diagnosis Date Noted  . Medial epicondylitis 11/02/2016  . Hallux rigidus of right foot 11/02/2016  . Bunionette of right foot 11/02/2016  . Urinary stream splitting 03/20/2016  . Osteoporosis 11/17/2015  . Postmenopausal atrophic vaginitis 02/01/2015  . Vulvar lesion 02/01/2015  . Primary osteoarthritis of left knee 06/26/2014  . Impingement syndrome of right shoulder 03/11/2013  . 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 Pain at left costal margin. 11/15/2011  . ADHD (attention deficit hyperactivity disorder) 12/05/2010  . GENERALIZED ANXIETY DISORDER 02/16/2010  . PAP SMEAR, LGSIL, ABNORMAL 02/16/2010  . DIVERTICULITIS, HX OF 01/22/2010  . ALLERGIC RHINITIS 11/16/2009  . COLONIC POLYPS, HYPERPLASTIC 01/27/2009  . DEPRESSION 02/01/2008    Nare Gaspari Nilda Simmer PT, MPH  11/20/2016, 10:35 AM  [facility]  [place], [place], [address] Phone: [phone]   Fax:  [phone]  Name: Joniece Smotherman MRN: [id number] Date of Birth: 06/26/1960

## 2016-11-22 ENCOUNTER — Encounter: Payer: Self-pay | Admitting: Rehabilitative and Restorative Service Providers"

## 2016-11-22 ENCOUNTER — Ambulatory Visit: Payer: BC Managed Care – PPO | Admitting: Rehabilitative and Restorative Service Providers"

## 2016-11-22 DIAGNOSIS — M546 Pain in thoracic spine: Secondary | ICD-10-CM

## 2016-11-22 DIAGNOSIS — R29898 Other symptoms and signs involving the musculoskeletal system: Secondary | ICD-10-CM | POA: Diagnosis not present

## 2016-11-22 DIAGNOSIS — R293 Abnormal posture: Secondary | ICD-10-CM | POA: Diagnosis not present

## 2016-11-22 NOTE — Therapy (Signed)
[place], [place], [address] Phone: [phone]   Fax:  [phone]  Physical Therapy Treatment  Patient Details  Name: Shirley Moody MRN: [id number] Date of Birth: [birth year] Referring Provider: Dr Lynne Leader    Encounter Date: 11/22/2016  PT End of Session - 11/22/16 0855    Visit Number  12    Number of Visits  20    Date for PT Re-Evaluation  12/18/16    PT Start Time  0802    PT Stop Time  0856    PT Time Calculation (min)  54 min    Activity Tolerance  Patient tolerated treatment well       Past Medical History:  Diagnosis Date  . Allergy   . Colon polyp   . Depression   . Diverticulitis   . IBS (irritable bowel syndrome)   . Osteoporosis 11/17/2015   DEXA 2017    Past Surgical History:  Procedure Laterality Date  . DILATION AND CURETTAGE OF UTERUS    . NEUROMA SURGERY     Right foot    There were no vitals filed for this visit.  Subjective Assessment - 11/22/16 0900    Subjective  Worked out again yesterday at the gym and is sore and hurting "all over"     Currently in Pain?  Yes    Pain Score  5     Pain Location  Thoracic    Pain Orientation  Right;Left    Pain Descriptors / Indicators  Aching;Dull    Pain Type  Acute pain    Pain Onset  More than a month ago    Pain Frequency  Intermittent         [facility] PT Assessment - 11/22/16 0001      Assessment   Medical Diagnosis  Rt thoracic pain     Referring Provider  Dr Lynne Leader     Onset Date/Surgical Date  09/21/16    Hand Dominance  Right    Next MD Visit  12/14/16      Posture/Postural Control   Posture Comments  continued kyphotic thoracic posture; head forward; shoulders rounded       Palpation   Palpation comment  tightness noted through the thoracic paraspinals; Lt posterior cervical muaculature; upper traps; leveator; pecs with tightness noted bilaterally throughout                    [facility] Adult PT Treatment/Exercise - 11/22/16 0001      Shoulder Exercises: Prone   Retraction  Strengthening;10 reps axial extension with thoracic lift 10 sec x 10    axial extension with thoracic lift 10 sec x 10    Other Prone Exercises  w's; superman; hands clasp behind head x 10 each      Shoulder Exercises: Stretch   Other Shoulder Stretches  3 way doorway stretch 45 sec x 2     Other Shoulder Stretches  childs pose with lateral trunk flexion x 20 sec each side; yoga thread the needle (childs pose with thoracic rotation x 20 sec x 1; sitting lateral trunk flexion over pillows 30-45 sec x 1       Moist Heat Therapy   Number Minutes Moist Heat  20 Minutes    Moist Heat Location  Cervical thoracic region   thoracic region     Electrical Stimulation   Electrical Stimulation Location  bilat  thoracic     Electrical Stimulation  Action  IFC    Electrical Stimulation Parameters  to tolerance    Electrical Stimulation Goals  Pain;Tone      Ultrasound   Ultrasound Location  Rt/Lt thoracic paraspinals     Ultrasound Parameters  1.5 w/cm2; 1 mHz; 100%; 10 min     Ultrasound Goals  Pain;Other (Comment) muscular tightness    muscular tightness      Manual Therapy   Manual therapy comments  pt prone     Joint Mobilization  thoracic CPA and lateral mobs tight Rt > Lt ~ T4/5 area     Soft tissue mobilization  IASTM with edge tool through the thoracic paraspinals; working through [employer] cervical musculature and thoracic paraspinals, middle and lower traps, rhomboids bilat     Myofascial Release  thoracic spine     Passive ROM  springing through ribs bialt                   PT Long Term Goals - 11/22/16 0856      PT LONG TERM GOAL #1   Title  I with advanced HEP 12/18/16    Time  12    Period  Weeks    Status  Partially Met      PT LONG TERM GOAL #2   Title  Improve posture and alignment with patient to demonstrate improved upright  posture with engagement of posterior shoudler girdle musculature 12/18/16    Time  12    Period  Weeks    Status  On-going      PT LONG TERM GOAL #3   Title  patient reports 75-95% resloution of thoracic and Rt shoulder girdle pain 12/18/16    Time  12    Period  Weeks    Status  On-going      PT LONG TERM GOAL #4   Title  Increase cervical ROM by 8-10 degrees in limited planes 12/18/16    Time  12    Period  Weeks    Status  Partially Met      PT LONG TERM GOAL #5   Title  Improve FOTO to </= 35% limitation 12/18/16    Time  12    Period  Weeks    Status  On-going            Plan - 11/22/16 0857    Clinical Impression Statement  Patient reports increased pain following workouts in the gym this week. Muscular tightness persists in bilat thoracic paraspinals and Lt cervical musculature. She is progressing with goals of therapy.     Rehab Potential  Good    PT Frequency  2x / week    PT Duration  12 weeks    PT Treatment/Interventions  Patient/family education;ADLs/Self Care Home Management;Cryotherapy;Electrical Stimulation;Iontophoresis 45m/ml Dexamethasone;Moist Heat;Ultrasound;Dry needling;Manual techniques;Therapeutic activities;Therapeutic exercise;Neuromuscular re-education    PT Next Visit Plan  Continue postural strengthening.  Manual therapy and modalities as indicated. Continue DN with estim prn - assess ROM at next visit.     Consulted and Agree with Plan of Care  Patient       Patient will benefit from skilled therapeutic intervention in order to improve the following deficits and impairments:  Postural dysfunction, Improper body mechanics, Pain, Increased fascial restricitons, Increased muscle spasms, Decreased range of motion, Decreased mobility, Decreased strength, Decreased activity tolerance  Visit Diagnosis: Pain in thoracic spine  Other symptoms and signs involving the musculoskeletal system  Abnormal posture     Problem List Patient  Active Problem  List   Diagnosis Date Noted  . Medial epicondylitis 11/02/2016  . Hallux rigidus of right foot 11/02/2016  . Bunionette of right foot 11/02/2016  . Urinary stream splitting 03/20/2016  . Osteoporosis 11/17/2015  . Postmenopausal atrophic vaginitis 02/01/2015  . Vulvar lesion 02/01/2015  . Primary osteoarthritis of left knee 06/26/2014  . Impingement syndrome of right shoulder 03/11/2013  . 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 Pain at left costal margin. 11/15/2011  . ADHD (attention deficit hyperactivity disorder) 12/05/2010  . GENERALIZED ANXIETY DISORDER 02/16/2010  . PAP SMEAR, LGSIL, ABNORMAL 02/16/2010  . DIVERTICULITIS, HX OF 01/22/2010  . ALLERGIC RHINITIS 11/16/2009  . COLONIC POLYPS, HYPERPLASTIC 01/27/2009  . DEPRESSION 02/01/2008    Dionel Archey Nilda Simmer PT, MPH  11/22/2016, 9:15 AM  [facility] [place], [place], [address] Phone: [phone]   Fax:  [phone]  Name: Tytionna Cloyd MRN: [id number] Date of Birth: Mar 20, 1960

## 2016-11-27 ENCOUNTER — Encounter: Payer: Self-pay | Admitting: Rehabilitative and Restorative Service Providers"

## 2016-11-27 ENCOUNTER — Other Ambulatory Visit: Payer: Self-pay

## 2016-11-27 ENCOUNTER — Ambulatory Visit: Payer: BC Managed Care – PPO | Admitting: Rehabilitative and Restorative Service Providers"

## 2016-11-27 DIAGNOSIS — R29898 Other symptoms and signs involving the musculoskeletal system: Secondary | ICD-10-CM | POA: Diagnosis not present

## 2016-11-27 DIAGNOSIS — M546 Pain in thoracic spine: Secondary | ICD-10-CM

## 2016-11-27 DIAGNOSIS — R293 Abnormal posture: Secondary | ICD-10-CM

## 2016-11-27 NOTE — Therapy (Signed)
[place], [place], [address] Phone: [phone]   Fax:  [phone]  Physical Therapy Treatment  Patient Details  Name: Shirley Moody MRN: [id number] Date of Birth: Feb 12, 1960 Referring Provider: Dr Lynne Leader    Encounter Date: 11/27/2016  PT End of Session - 11/27/16 0803    Visit Number  13    Number of Visits  20    Date for PT Re-Evaluation  12/18/16    PT Start Time  0800    PT Stop Time  0856    PT Time Calculation (min)  56 min    Activity Tolerance  Patient tolerated treatment well       Past Medical History:  Diagnosis Date  . Allergy   . Colon polyp   . Depression   . Diverticulitis   . IBS (irritable bowel syndrome)   . Osteoporosis 11/17/2015   DEXA 2017    Past Surgical History:  Procedure Laterality Date  . DILATION AND CURETTAGE OF UTERUS    . NEUROMA SURGERY     Right foot    There were no vitals filed for this visit.  Subjective Assessment - 11/27/16 0803    Subjective  Not bd today - had increased pain Saturday but was better yesterday.     Currently in Pain?  Yes    Pain Score  2     Pain Location  Thoracic    Pain Orientation  Left;Right    Pain Descriptors / Indicators  Aching;Dull    Pain Onset  More than a month ago         [facility] PT Assessment - 11/27/16 0001      Assessment   Medical Diagnosis  Rt thoracic pain     Referring Provider  Dr Lynne Leader     Onset Date/Surgical Date  09/21/16    Hand Dominance  Right    Next MD Visit  12/14/16      AROM   Right/Left Shoulder  -- ROM WNL's pain with Lt shd flex/scaption/abduction       Strength   Overall Strength Comments  5/5 bilat UE's       Palpation   Palpation comment  tightness noted through the thoracic paraspinals; Lt posterior cervical muaculature; upper traps; leveator; pecs with tightness noted bilaterally throughout                   [facility] Adult PT Treatment/Exercise  - 11/27/16 0001      Neuro Re-ed    Neuro Re-ed Details   work on posture and alignment engaging posterior shoudler girdle musculature  patient will work on cues for home to address posture       Shoulder Exercises: Stretch   Table Stretch - Flexion  2 reps;20 seconds biceps stretch at door     Other Shoulder Stretches  3 way doorway stretch 45 sec x 2  correction of position     Other Shoulder Stretches  childs pose with lateral trunk flexion x 20 sec each side; yoga thread the needle (childs pose with thoracic rotation x 20 sec x 1; sitting lateral trunk flexion over pillows 30-45 sec x 1       Moist Heat Therapy   Number Minutes Moist Heat  20 Minutes    Moist Heat Location  Shoulder thoracic; Lt shoulder       Acupuncturist Location  bilat  thoracic  Electrical Stimulation Action  IFC    Electrical Stimulation Parameters  to tolerance    Electrical Stimulation Goals  Pain;Tone      Ultrasound   Ultrasound Location  Lt/Rt thoracic paraspinals     Ultrasound Parameters  1.5 w/cm2; 1 mHz; 100%; 8 min     Ultrasound Goals  Pain;Other (Comment) muscular tightness       Manual Therapy   Manual therapy comments  pt prone and supine     Joint Mobilization  thoracic CPA and lateral mobs tight Rt > Lt ~ T4/5 area     Soft tissue mobilization  Lt anterior shoulder through pecs/biceps; thoracic paraspinals; working through [employer] cervical musculature and thoracic paraspinals, middle and lower traps, rhomboids bilat     Myofascial Release  thoracic spine     Passive ROM  springing through ribs bialt              PT Education - 11/27/16 0843    Education provided  Yes    Education Details  HEP     Person(s) Educated  Patient    Methods  Explanation;Demonstration;Tactile cues;Verbal cues;Handout    Comprehension  Verbalized understanding;Returned demonstration;Verbal cues required;Tactile cues required          PT Long Term  Goals - 11/22/16 0856      PT LONG TERM GOAL #1   Title  I with advanced HEP 12/18/16    Time  12    Period  Weeks    Status  Partially Met      PT LONG TERM GOAL #2   Title  Improve posture and alignment with patient to demonstrate improved upright posture with engagement of posterior shoudler girdle musculature 12/18/16    Time  12    Period  Weeks    Status  On-going      PT LONG TERM GOAL #3   Title  patient reports 75-95% resloution of thoracic and Rt shoulder girdle pain 12/18/16    Time  12    Period  Weeks    Status  On-going      PT LONG TERM GOAL #4   Title  Increase cervical ROM by 8-10 degrees in limited planes 12/18/16    Time  12    Period  Weeks    Status  Partially Met      PT LONG TERM GOAL #5   Title  Improve FOTO to </= 35% limitation 12/18/16    Time  12    Period  Weeks    Status  On-going            Plan - 11/27/16 0848    Clinical Impression Statement  Continiued intermittent pain in the thoracic spine area and anterior Lt shoulder. Respinded well to manual work through the anterior Lt shoulder and thoracic. Added biceps stretch and work on postural extension through thoracic spine.     Rehab Potential  Good    PT Frequency  2x / week    PT Duration  12 weeks    PT Treatment/Interventions  Patient/family education;ADLs/Self Care Home Management;Cryotherapy;Electrical Stimulation;Iontophoresis 21m/ml Dexamethasone;Moist Heat;Ultrasound;Dry needling;Manual techniques;Therapeutic activities;Therapeutic exercise;Neuromuscular re-education    PT Next Visit Plan  Continue postural strengthening.  Manual therapy and modalities as indicated. Continue DN with estim prn added neuromuscular re-ed cues for home to address thoracic posture     Consulted and Agree with Plan of Care  Patient       Patient will benefit from skilled therapeutic  intervention in order to improve the following deficits and impairments:  Postural dysfunction, Improper body mechanics,  Pain, Increased fascial restricitons, Increased muscle spasms, Decreased range of motion, Decreased mobility, Decreased strength, Decreased activity tolerance  Visit Diagnosis: Pain in thoracic spine  Other symptoms and signs involving the musculoskeletal system  Abnormal posture     Problem List Patient Active Problem List   Diagnosis Date Noted  . Medial epicondylitis 11/02/2016  . Hallux rigidus of right foot 11/02/2016  . Bunionette of right foot 11/02/2016  . Urinary stream splitting 03/20/2016  . Osteoporosis 11/17/2015  . Postmenopausal atrophic vaginitis 02/01/2015  . Vulvar lesion 02/01/2015  . Primary osteoarthritis of left knee 06/26/2014  . Impingement syndrome of right shoulder 03/11/2013  . 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 Pain at left costal margin. 11/15/2011  . ADHD (attention deficit hyperactivity disorder) 12/05/2010  . GENERALIZED ANXIETY DISORDER 02/16/2010  . PAP SMEAR, LGSIL, ABNORMAL 02/16/2010  . DIVERTICULITIS, HX OF 01/22/2010  . ALLERGIC RHINITIS 11/16/2009  . COLONIC POLYPS, HYPERPLASTIC 01/27/2009  . DEPRESSION 02/01/2008    Pedram Goodchild Nilda Simmer PT, MPH  11/27/2016, 8:51 AM  [facility] [place], [place], [address] Phone: [phone]   Fax:  [phone]  Name: Shakiyah Cirilo MRN: [id number] Date of Birth: Feb 25, 1960

## 2016-11-27 NOTE — Patient Instructions (Signed)
ELBOW: Biceps - Standing    Standing in doorway, place one hand on wall, elbow straight. Lean forward. Hold _30_ seconds. _2-3__ reps per set, 2-3 times per day

## 2016-11-29 ENCOUNTER — Encounter: Payer: Self-pay | Admitting: Rehabilitative and Restorative Service Providers"

## 2016-11-29 ENCOUNTER — Ambulatory Visit: Payer: BC Managed Care – PPO | Admitting: Rehabilitative and Restorative Service Providers"

## 2016-11-29 ENCOUNTER — Other Ambulatory Visit: Payer: Self-pay

## 2016-11-29 DIAGNOSIS — R29898 Other symptoms and signs involving the musculoskeletal system: Secondary | ICD-10-CM | POA: Diagnosis not present

## 2016-11-29 DIAGNOSIS — R293 Abnormal posture: Secondary | ICD-10-CM

## 2016-11-29 DIAGNOSIS — M546 Pain in thoracic spine: Secondary | ICD-10-CM

## 2016-11-29 NOTE — Therapy (Addendum)
[place], [place], [address] Phone: [phone]   Fax:  [phone]  Physical Therapy Treatment  Patient Details  Name: Sumedha Munnerlyn MRN: [id number] Date of Birth: 1960/08/10 Referring Provider: Dr Lynne Leader    Encounter Date: 11/29/2016  PT End of Session - 11/29/16 0807    Visit Number  14    Number of Visits  20    Date for PT Re-Evaluation  12/18/16    PT Start Time  0800    PT Stop Time  3361    PT Time Calculation (min)  57 min    Activity Tolerance  Patient tolerated treatment well       Past Medical History:  Diagnosis Date  . Allergy   . Colon polyp   . Depression   . Diverticulitis   . IBS (irritable bowel syndrome)   . Osteoporosis 11/17/2015   DEXA 2017    Past Surgical History:  Procedure Laterality Date  . DILATION AND CURETTAGE OF UTERUS    . NEUROMA SURGERY     Right foot    There were no vitals filed for this visit.  Subjective Assessment - 11/29/16 0809    Subjective  Lt shoulder into Lt neck area hurting today. Not sure of anything she did that may have irritated it. Midback is not bad.     Currently in Pain?  Yes    Pain Score  1     Pain Location  Thoracic Pain in the Lt shd and neck 5/10 today     Pain Orientation  Left;Right    Pain Descriptors / Indicators  Aching;Dull    Pain Type  Acute pain    Pain Onset  More than a month ago    Pain Frequency  Intermittent                      [facility] Adult PT Treatment/Exercise - 11/29/16 0001      Shoulder Exercises: Prone   Other Prone Exercises  w's; superman; hands clasp behind head x 10 each      Shoulder Exercises: Standing   Extension  Strengthening;Both;15 reps;Theraband    Theraband Level (Shoulder Extension)  Level 3 (Green)    Row  Strengthening;Both;15 reps;Theraband    Theraband Level (Shoulder Row)  Level 3 (Green)    Retraction  Strengthening;Both;15 reps    Theraband  Level (Shoulder Retraction)  Level 2 (Red)      Shoulder Exercises: Stretch   Table Stretch - Flexion  2 reps;20 seconds biceps stretch at door     Other Shoulder Stretches  3 way doorway stretch 45 sec x 2  correction of position     Other Shoulder Stretches  childs pose with lateral trunk flexion x 20 sec each side; yoga thread the needle (childs pose with thoracic rotation x 20 sec x 1; sitting lateral trunk flexion over pillows 30-45 sec x 1       Moist Heat Therapy   Number Minutes Moist Heat  20 Minutes    Moist Heat Location  Shoulder thoracic; Lt shoulder       Chief Executive Officer Action  IFC    Electrical Stimulation Parameters  to tolerance    Electrical Stimulation Goals  Pain;Tone      Ultrasound   Ultrasound Location  Lt pecs to biceps  Ultrasound Parameters  1.5 w/cm2; 1 mHz; 100%; 8 min     Ultrasound Goals  Pain;Other (Comment) muscular tightness       Manual Therapy   Manual therapy comments  pt supine     Soft tissue mobilization  Lt pecs; biceps     Myofascial Release  Lt pecs; biceps        Trigger Point Dry Needling - 11/29/16 0847    Muscles Treated Upper Body  -- Lt biceps - palpable decrease in tightness                 PT Long Term Goals - 11/29/16 0808      PT LONG TERM GOAL #1   Title  I with advanced HEP 12/18/16    Time  12    Period  Weeks    Status  Partially Met      PT LONG TERM GOAL #2   Title  Improve posture and alignment with patient to demonstrate improved upright posture with engagement of posterior shoudler girdle musculature 12/18/16    Time  12    Period  Weeks    Status  On-going      PT LONG TERM GOAL #3   Title  patient reports 75-95% resloution of thoracic and Rt shoulder girdle pain 12/18/16    Time  12    Period  Weeks    Status  On-going      PT LONG TERM GOAL #4   Title  Increase cervical ROM by 8-10 degrees in limited  planes 12/18/16    Time  12    Period  Weeks    Status  Partially Met      PT LONG TERM GOAL #5   Title  Improve FOTO to </= 35% limitation 12/18/16    Time  12    Period  Weeks    Status  On-going            Plan - 11/29/16 0845    Clinical Impression Statement  Thoracic pain is improved. Patient has increased pain and muscular tightness through the Lt pecs and biceps - likely from exercise class yesterday. Tolerated work through this area today with improved tissue extensibility noted following treatment.     Rehab Potential  Good    PT Frequency  2x / week    PT Duration  12 weeks    PT Treatment/Interventions  Patient/family education;ADLs/Self Care Home Management;Cryotherapy;Electrical Stimulation;Iontophoresis 40m/ml Dexamethasone;Moist Heat;Ultrasound;Dry needling;Manual techniques;Therapeutic activities;Therapeutic exercise;Neuromuscular re-education    PT Next Visit Plan  Continue postural strengthening.  Manual therapy and modalities as indicated. Continue DN with estim prn added neuromuscular re-ed cues for home to address thoracic posture     Consulted and Agree with Plan of Care  Patient       Patient will benefit from skilled therapeutic intervention in order to improve the following deficits and impairments:  Postural dysfunction, Improper body mechanics, Pain, Increased fascial restricitons, Increased muscle spasms, Decreased range of motion, Decreased mobility, Decreased strength, Decreased activity tolerance  Visit Diagnosis: Pain in thoracic spine  Other symptoms and signs involving the musculoskeletal system  Abnormal posture     Problem List Patient Active Problem List   Diagnosis Date Noted  . Medial epicondylitis 11/02/2016  . Hallux rigidus of right foot 11/02/2016  . Bunionette of right foot 11/02/2016  . Urinary stream splitting 03/20/2016  . Osteoporosis 11/17/2015  . Postmenopausal atrophic vaginitis 02/01/2015  . Vulvar lesion 02/01/2015   .  Primary osteoarthritis of left knee 06/26/2014  . Impingement syndrome of right shoulder 03/11/2013  . 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 Pain at left costal margin. 11/15/2011  . ADHD (attention deficit hyperactivity disorder) 12/05/2010  . GENERALIZED ANXIETY DISORDER 02/16/2010  . PAP SMEAR, LGSIL, ABNORMAL 02/16/2010  . DIVERTICULITIS, HX OF 01/22/2010  . ALLERGIC RHINITIS 11/16/2009  . COLONIC POLYPS, HYPERPLASTIC 01/27/2009  . DEPRESSION 02/01/2008    Katesha Eichel Nilda Simmer PT, MPH  11/29/2016, 8:48 AM  [facility] [place], [place], [address] Phone: [phone]   Fax:  [phone]  Name: Maridee Slape MRN: [id number] Date of Birth: 1960/09/25  PHYSICAL THERAPY DISCHARGE SUMMARY  Visits from Start of Care: 14  Current functional level related to goals / functional outcomes: See progress note for discharge status   Remaining deficits: Unknown    Education / Equipment: HEP  Plan: Patient agrees to discharge.  Patient goals were partially met. Patient is being discharged due to not returning since the last visit.  ?????    Raekwon Winkowski P. Helene Kelp PT, MPH 12/22/16 8:21 AM

## 2016-12-14 ENCOUNTER — Ambulatory Visit: Payer: BC Managed Care – PPO | Admitting: Family Medicine

## 2016-12-14 VITALS — BP 117/79 | HR 90 | Ht 63.0 in | Wt 124.0 lb

## 2016-12-14 DIAGNOSIS — M7582 Other shoulder lesions, left shoulder: Secondary | ICD-10-CM | POA: Insufficient documentation

## 2016-12-14 DIAGNOSIS — M7701 Medial epicondylitis, right elbow: Secondary | ICD-10-CM | POA: Diagnosis not present

## 2016-12-14 MED ORDER — NITROGLYCERIN 0.2 MG/HR TD PT24: MEDICATED_PATCH | TRANSDERMAL | 1 refills | Status: DC

## 2016-12-14 NOTE — Patient Instructions (Signed)
Thank you for coming in today. Continue the exercises.  Use the nitro patches on the elbow and shoulder.  Recheck in 6 weeks or sooner if needed.   Nitroglycerin Protocol   Apply 1/4 nitroglycerin patch to affected area daily.  Change position of patch within the affected area every 24 hours.  You may experience a headache during the first 1-2 weeks of using the patch, these should subside.  If you experience headaches after beginning nitroglycerin patch treatment, you may take your preferred over the counter pain reliever.  Another side effect of the nitroglycerin patch is skin irritation or rash related to patch adhesive.  Please notify our office if you develop more severe headaches or rash, and stop the patch.  Tendon healing with nitroglycerin patch may require 12 to 24 weeks depending on the extent of injury.  Men should not use if taking Viagra, Cialis, or Levitra.   Do not use if you have migraines or rosacea.

## 2016-12-14 NOTE — Progress Notes (Signed)
Dyanara Cozza is a 56 y.o. female who presents to [place] today for shoulder and elbow pain.   Ms. Graw was seen previously in September and subsequently in October for thoracic back pain and bilateral medial epicondylitis.  She is been seen in physical therapy multiple times and notes that her thoracic back pain has largely improved.  Her elbow pain has also improved and is now mostly in the right medial elbow.  She does note new lateral upper arm and shoulder pain.  She suspects this is rotator cuff tendinitis as it is consistent with rotator cuff tendinitis that she had in her right shoulder.  He notes the shoulder pain is worse with overhead motion and reaching back.  She denies any radiating pain.  She notes the right medial elbow pain is worse with action.  She continues home exercises for the elbow which she finds helpful.   Past Medical History:  Diagnosis Date  . Allergy   . Colon polyp   . Depression   . Diverticulitis   . IBS (irritable bowel syndrome)   . Osteoporosis 11/17/2015   DEXA 2017   Past Surgical History:  Procedure Laterality Date  . DILATION AND CURETTAGE OF UTERUS    . NEUROMA SURGERY     Right foot   Social History   Tobacco Use  . Smoking status: Former Research scientist (life sciences)  . Smokeless tobacco: Never Used  Substance Use Topics  . Alcohol use: Yes    Alcohol/week: 0.0 oz    Comment: 1 drink/month     ROS:  As above   Medications: Current Outpatient Medications  Medication Sig Dispense Refill  . alendronate (FOSAMAX) 70 MG tablet TAKE 1 TABLET(70 MG) BY MOUTH 1 TIME A WEEK WITH A FULL GLASS OF WATER AND ON AN EMPTY STOMACH 12 tablet 4  . buPROPion (WELLBUTRIN XL) 150 MG 24 hr tablet Take 450 mg by mouth daily.     . Calcium Carbonate (CALCIUM 600 PO) Take 600 mg by mouth.    . cetirizine (ZYRTEC) 10 MG tablet Take 10 mg by mouth daily.      . cyclobenzaprine (FLEXERIL) 5 MG tablet Take 1-2 tablets  (5-10 mg total) by mouth at bedtime as needed for muscle spasms. 30 tablet 1  . diclofenac sodium (VOLTAREN) 1 % GEL Apply 2 g topically 4 (four) times daily. To affected joint. 100 g 11  . Estradiol 10 MCG TABS vaginal tablet Insert one tablet vaginally twice a week. 30 tablet 2  . FLUoxetine (PROZAC) 10 MG capsule TK 1 C PO D  2  . Magnesium 250 MG TABS Take by mouth.    . mometasone (NASONEX) 50 MCG/ACT nasal spray SHAKE LIQUID AND USE 1 SPRAY IN EACH NOSTRIL EVERY DAY AS DIRECTED 17 g 4  . Probiotic Product (PROBIOTIC PO) Take by mouth.    . traZODone (DESYREL) 100 MG tablet     . nitroGLYCERIN (NITRODUR - DOSED IN MG/24 HR) 0.2 mg/hr patch 1/4 patch to tendon daily for tendonitis 30 patch 1   No current facility-administered medications for this visit.    Allergies  Allergen Reactions  . Biaxin [Clarithromycin]     "gives me lockjaw"  . Concerta [Methylphenidate] Other (See Comments)    Says affected her sleep, made her jittery and would suck in her lips  . Penicillins     "amoxicillin doesn't do anything for me anymore so I don't want it'      Exam:  BP 117/79   Pulse 90   Ht 5\' 3"  (1.6 m)   Wt 124 lb (56.2 kg)   LMP 04/05/2011   BMI 21.97 kg/m  General: Well Developed, well nourished, and in no acute distress.  Neuro/Psych: Alert and oriented x3, extra-ocular muscles intact, able to move all 4 extremities, sensation grossly intact. Skin: Warm and dry, no rashes noted.  Respiratory: Not using accessory muscles, speaking in full sentences, trachea midline.  Cardiovascular: Pulses palpable, no extremity edema. Abdomen: Does not appear distended. MSK:  Left shoulder normal-appearing nontender. Normal motion pain with rotation. Positive Hawkins and Neer's test. Positive empty can.  Negative Yergason's and speeds test. Length is intact.  Right elbow normal-appearing tender palpation medial epicondyle.  Pain with resisted wrist flexion.  Normal elbow motion.   No  results found for this or any previous visit (from the past 48 hour(s)). No results found.    Assessment and Plan: 56 y.o. female with  Left shoulder pain: New rotator cuff tendinopathy.  Plan for home exercise, physical therapy, and nitroglycerin patches.  Discussed injections patient will have a trial of therapy first.  Right medial epicondylitis: Improving but not yet fully improved.  Continue therapy and exercise program.  Will use trial of nitroglycerin patches.    Recheck in a few weeks if not better.    Orders Placed This Encounter  Procedures  . Ambulatory referral to Physical Therapy    Referral Priority:   Routine    Referral Type:   Physical Medicine    Referral Reason:   Specialty Services Required    Requested Specialty:   Physical Therapy   Meds ordered this encounter  Medications  . nitroGLYCERIN (NITRODUR - DOSED IN MG/24 HR) 0.2 mg/hr patch    Sig: 1/4 patch to tendon daily for tendonitis    Dispense:  30 patch    Refill:  1    Discussed warning signs or symptoms. Please see discharge instructions. Patient expresses understanding.

## 2016-12-27 ENCOUNTER — Encounter: Payer: Self-pay | Admitting: Rehabilitative and Restorative Service Providers"

## 2016-12-27 ENCOUNTER — Ambulatory Visit: Payer: BC Managed Care – PPO | Admitting: Rehabilitative and Restorative Service Providers"

## 2016-12-27 DIAGNOSIS — M25521 Pain in right elbow: Secondary | ICD-10-CM

## 2016-12-27 DIAGNOSIS — M25512 Pain in left shoulder: Secondary | ICD-10-CM

## 2016-12-27 DIAGNOSIS — R29898 Other symptoms and signs involving the musculoskeletal system: Secondary | ICD-10-CM | POA: Diagnosis not present

## 2016-12-27 DIAGNOSIS — M25522 Pain in left elbow: Secondary | ICD-10-CM | POA: Diagnosis not present

## 2016-12-27 DIAGNOSIS — R293 Abnormal posture: Secondary | ICD-10-CM | POA: Diagnosis not present

## 2016-12-27 NOTE — Patient Instructions (Signed)
Scapula Adduction With Pectoralis Stretch: Low - Standing   Shoulders at 45 hands even with shoulders, keeping weight through legs, shift weight forward until you feel pull or stretch through the front of your chest. Hold _30__ seconds. Do _3__ times, _2-4__ times per day.   Scapula Adduction With Pectoralis Stretch: Mid-Range - Standing   Shoulders at 90 elbows even with shoulders, keeping weight through legs, shift weight forward until you feel pull or strength through the front of your chest. Hold __30_ seconds. Do _3__ times, __2-4_ times per day.   Scapula Adduction With Pectoralis Stretch: High - Standing   Shoulders at 120 hands up high on the doorway, keeping weight on feet, shift weight forward until you feel pull or stretch through the front of your chest. Hold _30__ seconds. Do _3__ times, _2-3__ times per day.   Stretching for inside of elbow  Palm up Pull palm down stretching wrist and forearm Hold 30 sec x 3 reps 2-3 times/day   Standing chest up - yellow band around forearms tense band without moving arms  Hold 5-10 sec  Repeat 10 times 1-2 sets 2-3 times/day   Axial Extension (Chin Tuck)    Pull chin in and lengthen back of neck. Hold __5__ seconds while counting out loud. Repeat __10__ times. Do __several__ sessions per day.  Shoulder Blade Squeeze    Rotate shoulders back, then squeeze shoulder blades down and back. Hold 10 sec Repeat _10___ times. Do __3-4 __ sessions per day.  Upper Back Strength: Lower Trapezius / Rotator Cuff " L's "     Arms in waitress pose, palms up. Press hands back and slide shoulder blades down. Hold for __5__ seconds. Repeat _10___ times. 1-2 times per day.    Scapular Retraction: Elbow Flexion (Standing)  "W's"     With elbows bent to 90, pinch shoulder blades together and rotate arms out, keeping elbows bent. Repeat __10__ times per set. Do __1-2__ sets per session. Do _several ___ sessions per day.   [facility] [place], [place] [address]  [phone] (office) [phone] (fax)

## 2016-12-27 NOTE — Therapy (Signed)
[place], [place], [address] Phone: [phone]   Fax:  [phone]  Physical Therapy Evaluation  Patient Details  Name: Shirley Moody MRN: [id number] Date of Birth: 01/19/1960 Referring Provider: Dr Lynne Leader    Encounter Date: 12/27/2016  PT End of Session - 12/27/16 1102    Visit Number  1    Number of Visits  12    Date for PT Re-Evaluation  02/07/17    PT Start Time  1100    PT Stop Time  1201    PT Time Calculation (min)  61 min    Activity Tolerance  Patient tolerated treatment well       Past Medical History:  Diagnosis Date  . Allergy   . Colon polyp   . Depression   . Diverticulitis   . IBS (irritable bowel syndrome)   . Osteoporosis 11/17/2015   DEXA 2017    Past Surgical History:  Procedure Laterality Date  . DILATION AND CURETTAGE OF UTERUS    . NEUROMA SURGERY     Right foot    There were no vitals filed for this visit.   Subjective Assessment - 12/27/16 1105    Subjective  Lt shoulder continues to hurt. She has had pain for the past several weeks. She was in an exercise class and felt significant increase in pain in the Lt shoulder. She has some pain in the Lt elbow but that seems to be improved.     Pertinent History  prior LBP; osteoporosis; Rt rotator cuff pain     How long can you sit comfortably?  no limit    How long can you stand comfortably?  no limit    How long can you walk comfortably?  no limit    Patient Stated Goals  get rid of painin the Lt shoulder     Currently in Pain?  Yes    Pain Score  7     Pain Location  Shoulder    Pain Orientation  Left    Pain Descriptors / Indicators  Dull;Aching    Pain Type  Acute pain    Pain Radiating Towards  inbto the Lt neck area and across chest to underarm     Pain Onset  More than a month ago    Pain Frequency  Intermittent    Aggravating Factors   using Lt arm for functional activities; reaching up;  reaching behnd her back; putting arm in coat; lying on the Lt side; mopping; too much exercise     Pain Relieving Factors  heat; ice; stretching         [facility] PT Assessment - 12/27/16 0001      Assessment   Medical Diagnosis  Lt rotator cuff tendonitis; Lt medial epicondylitis    Referring Provider  Dr Lynne Leader     Onset Date/Surgical Date  11/04/16    Hand Dominance  Right    Next MD Visit  01/22/17    Prior Therapy  yes for thoracic pain      Precautions   Precautions  None      Balance Screen   Has the patient fallen in the past 6 months  No    Has the patient had a decrease in activity level because of a fear of falling?   No    Is the patient reluctant to leave their home because of a fear of falling?   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     Leisure  exercise class 2-3 times/wk for 45-90 min       Observation/Other Assessments   Focus on Therapeutic Outcomes (FOTO)   43% limitation       Sensation   Additional Comments  Wfl's per pt report       Posture/Postural Control   Posture Comments  kyphotic thoracic posture; head forward; shoulders rounded       AROM   Right Shoulder Extension  64 Degrees    Right Shoulder Flexion  163 Degrees    Right Shoulder ABduction  172 Degrees    Right Shoulder Internal Rotation  35 Degrees    Right Shoulder External Rotation  98 Degrees    Left Shoulder Extension  49 Degrees    Left Shoulder Flexion  145 Degrees    Left Shoulder ABduction  130 Degrees    Left Shoulder Internal Rotation  33 Degrees    Left Shoulder External Rotation  90 Degrees    Right/Left Elbow  -- bilat elbow ROM WNL's     Cervical Flexion  58    Cervical Extension  54    Cervical - Right Side Bend  37    Cervical - Left Side Bend  32    Cervical - Right Rotation  72    Cervical - Left Rotation  71      Strength   Overall  Strength Comments  5/5 Rt UE; 5-/5 Lt shoulder flex; abd; ER; IR       Palpation   Palpation comment  tightness noted through Lt posterior/lateral cervical muaculature; upper traps; leveator; pecs Lt > Rt; medial epicondyle Rt > Lt              Objective measurements completed on examination: See above findings.      [facility] Adult PT Treatment/Exercise - 12/27/16 0001      Shoulder Exercises: Standing   Other Standing Exercises  ; isometric scapular retraction yellow TB 10 5 sec x 10 with noodle     Other Standing Exercises  L's and W's x 10       Shoulder Exercises: ROM/Strengthening   UBE (Upper Arm Bike)  L3: 4 min forward/backward (standing)      Shoulder Exercises: Stretch   Other Shoulder Stretches  3 way doorway stretch 30 sec x 3; horizontal adductorshoulder stretch  30 sec x 2 Lt  correction of position     Other Shoulder Stretches  forearm flexor stretch elbow extended; wrist extended 30 sec x 2 each UE       Moist Heat Therapy   Number Minutes Moist Heat  20 Minutes    Moist Heat Location  Shoulder thoracic; Lt shoulder       Electrical Stimulation   Electrical Stimulation Location  Lt shoulder girdle     Electrical Stimulation Action  IFC    Electrical Stimulation Parameters  to tolerance    Electrical Stimulation Goals  Pain;Tone      Iontophoresis   Type of Iontophoresis  Dexamethasone    Location  bilat medial epicondyles    Dose  80 mAmp     Time  6 hours              PT Education - 12/27/16 1145    Education provided  Yes  Education Details  HEP     Person(s) Educated  Patient    Methods  Explanation;Demonstration;Tactile cues;Verbal cues;Handout    Comprehension  Verbalized understanding;Returned demonstration;Verbal cues required;Tactile cues required          PT Long Term Goals - 12/27/16 1210      PT LONG TERM GOAL #1   Title  I with advanced HEP 02/07/17    Time  6    Period  Weeks    Status  New      PT LONG TERM GOAL #2    Title  Improve posture and alignment with patient to demonstrate improved upright posture with engagement of posterior shoudler girdle musculature 02/07/17    Time  6    Period  Weeks    Status  New      PT LONG TERM GOAL #3   Title  patient reports 50-75% resloution of Lt shoulder girdle pain and bilat elbow pain 02/07/17    Time  6    Period  Weeks    Status  New      PT LONG TERM GOAL #4   Title  Increase AROM Lt shoulder to equal AROM Rt shoulder 02/07/17    Time  6    Period  Weeks    Status  New      PT LONG TERM GOAL #5   Title  Improve FOTO to </= 33% limitation 02/07/17    Time  6    Period  Weeks    Status  New             Plan - 12/27/16 1205    Clinical Impression Statement  Kimley presents with Lt shoulder pain and bilat medial epicondyle pain which has been present for the past couple of months. Patient was previously treated in PT for Rt thoracic and shoulder pain with symptoms resolved for the most part. She does not report any known injury to Lt shoulder. Lorriane Shire presents with poor thoracic and cervical posture; limited Lt shoulder ROM; decreased strength and pain with AROM Lt shoulder. She has muscular tightness and pain with palpation through the Lt shoulder girdle into the Lt posterior/lateral cervical spine musculature. She has pain on a daily basis with functional activities and will benefit form PT to address problems identified.     Rehab Potential  Good    PT Frequency  2x / week    PT Duration  6 weeks    PT Treatment/Interventions  Patient/family education;ADLs/Self Care Home Management;Cryotherapy;Electrical Stimulation;Iontophoresis 4mg /ml Dexamethasone;Moist Heat;Ultrasound;Dry needling;Manual techniques;Therapeutic activities;Therapeutic exercise;Neuromuscular re-education    PT Next Visit Plan  Continue postural strengthening.  Manual therapy and modalities as indicated. Continue DN with estim prn added neuromuscular re-ed cues for home to address  thoracic posture; tiial of iontophoresis bilat medial epicondylitis     Consulted and Agree with Plan of Care  Patient       Patient will benefit from skilled therapeutic intervention in order to improve the following deficits and impairments:  Postural dysfunction, Improper body mechanics, Pain, Increased fascial restricitons, Increased muscle spasms, Decreased range of motion, Decreased mobility, Decreased strength, Decreased activity tolerance  Visit Diagnosis: Acute pain of left shoulder - Plan: PT plan of care cert/re-cert  Other symptoms and signs involving the musculoskeletal system - Plan: PT plan of care cert/re-cert  Abnormal posture - Plan: PT plan of care cert/re-cert  Pain in right elbow - Plan: PT plan of care cert/re-cert  Pain in left elbow - Plan:  PT plan of care cert/re-cert     Problem List Patient Active Problem List   Diagnosis Date Noted  . Rotator cuff tendonitis, left 12/14/2016  . Medial epicondylitis 11/02/2016  . Hallux rigidus of right foot 11/02/2016  . Bunionette of right foot 11/02/2016  . Urinary stream splitting 03/20/2016  . Osteoporosis 11/17/2015  . Postmenopausal atrophic vaginitis 02/01/2015  . Vulvar lesion 02/01/2015  . Primary osteoarthritis of left knee 06/26/2014  . Impingement syndrome of right shoulder 03/11/2013  . 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 Pain at left costal margin. 11/15/2011  . ADHD (attention deficit hyperactivity disorder) 12/05/2010  . GENERALIZED ANXIETY DISORDER 02/16/2010  . PAP SMEAR, LGSIL, ABNORMAL 02/16/2010  . DIVERTICULITIS, HX OF 01/22/2010  . ALLERGIC RHINITIS 11/16/2009  . COLONIC POLYPS, HYPERPLASTIC 01/27/2009  . DEPRESSION 02/01/2008    Galya Dunnigan Nilda Simmer PT, MPH  12/27/2016, 12:17 PM  [facility] [place], [place], [address] Phone: [phone]   Fax:  [phone]  Name: Tyreshia Ingman MRN: [id number] Date of Birth:  [birth year]

## 2016-12-29 ENCOUNTER — Ambulatory Visit: Payer: BC Managed Care – PPO | Admitting: Physical Therapy

## 2016-12-29 DIAGNOSIS — M25521 Pain in right elbow: Secondary | ICD-10-CM | POA: Diagnosis not present

## 2016-12-29 DIAGNOSIS — R293 Abnormal posture: Secondary | ICD-10-CM | POA: Diagnosis not present

## 2016-12-29 DIAGNOSIS — M25512 Pain in left shoulder: Secondary | ICD-10-CM

## 2016-12-29 DIAGNOSIS — M25522 Pain in left elbow: Secondary | ICD-10-CM | POA: Diagnosis not present

## 2016-12-29 DIAGNOSIS — R29898 Other symptoms and signs involving the musculoskeletal system: Secondary | ICD-10-CM | POA: Diagnosis not present

## 2016-12-29 NOTE — Therapy (Signed)
[place], [place], [address] Phone: [phone]   Fax:  [phone]  Physical Therapy Treatment  Patient Details  Name: Shirley Moody MRN: [id number] Date of Birth: 1960/02/19 Referring Provider: Dr. Lynne Leader    Encounter Date: 12/29/2016  PT End of Session - 12/29/16 0941    Visit Number  2    Number of Visits  12    Date for PT Re-Evaluation  02/07/17    PT Start Time  0939 pt arrived late    PT Stop Time  1034    PT Time Calculation (min)  55 min    Activity Tolerance  Patient tolerated treatment well    Behavior During Therapy  [facility] for tasks assessed/performed       Past Medical History:  Diagnosis Date  . Allergy   . Colon polyp   . Depression   . Diverticulitis   . IBS (irritable bowel syndrome)   . Osteoporosis 11/17/2015   DEXA 2017    Past Surgical History:  Procedure Laterality Date  . DILATION AND CURETTAGE OF UTERUS    . NEUROMA SURGERY     Right foot    There were no vitals filed for this visit.  Subjective Assessment - 12/29/16 0941    Subjective  Pt reports no new changes since last visit. She didn't notice any difference with ionto patches. No skin irritation noted.    Pertinent History  prior LBP; osteoporosis; Rt rotator cuff pain     Patient Stated Goals  get rid of pain in the Lt shoulder     Currently in Pain?  Yes    Pain Score  6     Pain Location  Shoulder    Pain Orientation  Left    Pain Descriptors / Indicators  Dull    Aggravating Factors   reaching back, reaching overhead.     Pain Relieving Factors  heat, stretching     Multiple Pain Sites  Yes    Pain Score  0 up to 7/10 with lifting    Pain Location  Elbow    Pain Orientation  Left;Right Rt>Lt    Pain Descriptors / Indicators  Tender;Sore    Aggravating Factors   lifting    Pain Relieving Factors  ??         [facility] PT Assessment - 12/29/16 0001      Assessment   Medical Diagnosis   Lt rotator cuff tendonitis; Lt medial epicondylitis    Referring Provider  Dr. Lynne Leader     Onset Date/Surgical Date  11/04/16    Hand Dominance  Right    Next MD Visit  01/22/17         [facility] Adult PT Treatment/Exercise - 12/29/16 0001      Shoulder Exercises: Supine   Other Supine Exercises  prolonged stretch of Lt/Rt pec stretch (arm abd to 90 deg, with wrist ext x 2 min each side.       Shoulder Exercises: Prone   Other Prone Exercises  w's x 10 with axial ext; opp arm/leg with axial ext and 10 each side.       Shoulder Exercises: Standing   Other Standing Exercises   With back against wall:  W's with small motion up and back down x 10 reps       Shoulder Exercises: ROM/Strengthening   UBE (Upper Arm Bike)  L1: 2 min backward, 1.5 min forward. standing  Shoulder Exercises: Stretch   Other Shoulder Stretches  low and mid level doorway stretch x 45 sec x 2 reps; difficulty with high level stretch, switched to supine position.     Other Shoulder Stretches  wrist ext stretch x 30 sec x 2 reps each side.       Moist Heat Therapy   Number Minutes Moist Heat  20 Minutes    Moist Heat Location  Shoulder;Elbow thoracic; Lt shoulder; Rt elbow       Electrical Stimulation   Electrical Stimulation Location  Lt shoulder girdle     Electrical Stimulation Action  IFC    Electrical Stimulation Parameters  to tolerance    Electrical Stimulation Goals  Tone;Pain      Iontophoresis   Type of Iontophoresis  Dexamethasone    Location  bilat medial epicondyles    Dose  1.0 cc each patch    Time  188mA; 8 hr patch      Manual Therapy   Soft tissue mobilization  Rt/Lt wrist flexors and extensors; Edge tool assistance to same area to decrease fascial restrictions, pain, and improve ROM.                   PT Long Term Goals - 12/27/16 1210      PT LONG TERM GOAL #1   Title  I with advanced HEP 02/07/17    Time  6    Period  Weeks    Status  New      PT LONG TERM GOAL  #2   Title  Improve posture and alignment with patient to demonstrate improved upright posture with engagement of posterior shoudler girdle musculature 02/07/17    Time  6    Period  Weeks    Status  New      PT LONG TERM GOAL #3   Title  patient reports 50-75% resloution of Lt shoulder girdle pain and bilat elbow pain 02/07/17    Time  6    Period  Weeks    Status  New      PT LONG TERM GOAL #4   Title  Increase AROM Lt shoulder to equal AROM Rt shoulder 02/07/17    Time  6    Period  Weeks    Status  New      PT LONG TERM GOAL #5   Title  Improve FOTO to </= 33% limitation 02/07/17    Time  6    Period  Weeks    Status  New            Plan - 12/29/16 1026    Clinical Impression Statement  Pt was unable to tolerate high level shoulder stretch; improved with change of position. She tolerated all other exercises well.  Pt had significant fascial tightness in Rt wrist flex/ext.  Pt reported reduction of tightness and pain at end of session.   Progressing towards goals.    Rehab Potential  Good    PT Frequency  2x / week    PT Duration  6 weeks    PT Treatment/Interventions  Patient/family education;ADLs/Self Care Home Management;Cryotherapy;Electrical Stimulation;Iontophoresis 4mg /ml Dexamethasone;Moist Heat;Ultrasound;Dry needling;Manual techniques;Therapeutic activities;Therapeutic exercise;Neuromuscular re-education    PT Next Visit Plan  Continue postural strengthening.  Manual therapy and modalities as indicated. Continue DN with estim prn added neuromuscular re-ed cues for home to address thoracic posture; tiial of iontophoresis bilat medial epicondylitis        Patient will benefit from skilled therapeutic  intervention in order to improve the following deficits and impairments:  Postural dysfunction, Improper body mechanics, Pain, Increased fascial restricitons, Increased muscle spasms, Decreased range of motion, Decreased mobility, Decreased strength, Decreased activity  tolerance  Visit Diagnosis: Acute pain of left shoulder  Other symptoms and signs involving the musculoskeletal system  Abnormal posture  Pain in right elbow  Pain in left elbow     Problem List Patient Active Problem List   Diagnosis Date Noted  . Rotator cuff tendonitis, left 12/14/2016  . Medial epicondylitis 11/02/2016  . Hallux rigidus of right foot 11/02/2016  . Bunionette of right foot 11/02/2016  . Urinary stream splitting 03/20/2016  . Osteoporosis 11/17/2015  . Postmenopausal atrophic vaginitis 02/01/2015  . Vulvar lesion 02/01/2015  . Primary osteoarthritis of left knee 06/26/2014  . Impingement syndrome of right shoulder 03/11/2013  . 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 Pain at left costal margin. 11/15/2011  . ADHD (attention deficit hyperactivity disorder) 12/05/2010  . GENERALIZED ANXIETY DISORDER 02/16/2010  . PAP SMEAR, LGSIL, ABNORMAL 02/16/2010  . DIVERTICULITIS, HX OF 01/22/2010  . ALLERGIC RHINITIS 11/16/2009  . COLONIC POLYPS, HYPERPLASTIC 01/27/2009  . DEPRESSION 02/01/2008   Kerin Perna, PTA 12/29/16 10:42 AM  [place], [place], [address] Phone: [phone]   Fax:  [phone]  Name: Verleen Stuckey MRN: [id number] Date of Birth: [birth year]

## 2017-01-02 ENCOUNTER — Encounter: Payer: Self-pay | Admitting: Rehabilitative and Restorative Service Providers"

## 2017-01-02 ENCOUNTER — Ambulatory Visit: Payer: BC Managed Care – PPO | Admitting: Rehabilitative and Restorative Service Providers"

## 2017-01-02 DIAGNOSIS — R293 Abnormal posture: Secondary | ICD-10-CM | POA: Diagnosis not present

## 2017-01-02 DIAGNOSIS — R29898 Other symptoms and signs involving the musculoskeletal system: Secondary | ICD-10-CM

## 2017-01-02 DIAGNOSIS — M25512 Pain in left shoulder: Secondary | ICD-10-CM | POA: Diagnosis not present

## 2017-01-02 NOTE — Patient Instructions (Signed)

## 2017-01-02 NOTE — Therapy (Signed)
[place], [place], [address] Phone: [phone]   Fax:  [phone]  Physical Therapy Treatment  Patient Details  Name: Shirley Moody MRN: [id number] Date of Birth: 11-09-1960 Referring Provider: Dr. Lynne Leader    Encounter Date: 01/02/2017  PT End of Session - 01/02/17 0852    Visit Number  3    Number of Visits  12    Date for PT Re-Evaluation  02/07/17    PT Start Time  0849    PT Stop Time  0945    PT Time Calculation (min)  56 min    Activity Tolerance  Patient tolerated treatment well       Past Medical History:  Diagnosis Date  . Allergy   . Colon polyp   . Depression   . Diverticulitis   . IBS (irritable bowel syndrome)   . Osteoporosis 11/17/2015   DEXA 2017    Past Surgical History:  Procedure Laterality Date  . DILATION AND CURETTAGE OF UTERUS    . NEUROMA SURGERY     Right foot    There were no vitals filed for this visit.  Subjective Assessment - 01/02/17 0852    Subjective  Patient reports continued pain in the Lt shoulder with functional activities and when she tries to turn over in bed at night. Elbows seem to feel a little bit better.     Currently in Pain?  Yes    Pain Score  5     Pain Location  Shoulder    Pain Orientation  Left    Pain Descriptors / Indicators  Dull;Aching    Pain Type  Acute pain    Pain Onset  More than a month ago    Pain Frequency  Intermittent    Pain Score  6 3 on Lt 6 on the Rt     Pain Location  Elbow    Pain Orientation  Right;Left    Pain Descriptors / Indicators  Tender;Sore                      [facility] Adult PT Treatment/Exercise - 01/02/17 0001      Shoulder Exercises: Standing   Other Standing Exercises   With back against wall:  W's with small motion up and back down x 10 reps       Shoulder Exercises: ROM/Strengthening   UBE (Upper Arm Bike)  L1: 2 min backward, 1.5 min forward. standing      Shoulder Exercises: Stretch   Other Shoulder Stretches  low and mid level doorway stretch x 45 sec x 2 reps; difficulty with high level stretch, switched to supine position.     Other Shoulder Stretches  wrist ext stretch x 30 sec x 2 reps each side.       Moist Heat Therapy   Number Minutes Moist Heat  20 Minutes    Moist Heat Location  Shoulder;Elbow thoracic; Lt shoulder; Rt elbow       Electrical Stimulation   Electrical Stimulation Location  Lt shoulder girdle     Electrical Stimulation Action  IFC    Electrical Stimulation Parameters  to tolerance    Electrical Stimulation Goals  Tone;Pain      Ultrasound   Ultrasound Location  Lt anterior shoulder/arm     Ultrasound Parameters  1.5 w/cm2; 1 mHz; 100%; 8 min     Ultrasound Goals  Pain;Other (Comment) tightness  Iontophoresis   Type of Iontophoresis  Dexamethasone    Location  bilat medial epicondyles    Dose  1.0 cc each patch    Time  183mA; 8 hr patch       Trigger Point Dry Needling - 01/02/17 0941    Consent Given?  Yes    Education Handout Provided  Yes    Muscles Treated Upper Body  -- anterior deltoid - Lt     Rhomboids Response  Palpable increased muscle length    Longissimus Response  Palpable increased muscle length           PT Education - 01/02/17 0942    Education provided  Yes    Education Details  DN     Person(s) Educated  Patient    Methods  Explanation    Comprehension  Verbalized understanding          PT Long Term Goals - 01/02/17 0930      PT LONG TERM GOAL #1   Title  I with advanced HEP 02/07/17    Time  6    Period  Weeks    Status  On-going      PT LONG TERM GOAL #2   Title  Improve posture and alignment with patient to demonstrate improved upright posture with engagement of posterior shoudler girdle musculature 02/07/17    Time  6    Period  Weeks    Status  On-going      PT LONG TERM GOAL #3   Title  patient reports 50-75% resloution of Lt shoulder girdle pain and  bilat elbow pain 02/07/17    Time  6    Period  Weeks    Status  On-going      PT LONG TERM GOAL #4   Title  Increase AROM Lt shoulder to equal AROM Rt shoulder 02/07/17    Time  6    Period  Weeks    Status  On-going      PT LONG TERM GOAL #5   Title  Improve FOTO to </= 33% limitation 02/07/17    Time  6    Period  Weeks    Status  On-going            Plan - 01/02/17 0901    Clinical Impression Statement  Patient reports continued pain in the Lt shoulder and bilat elbows. Symptoms are better at sometimes than others. She is consistent with HEP and posture and alignment are improving. Lt shoulder pain and bilat medial elbow pain continue.     Clinical Presentation  Evolving    Clinical Decision Making  Low    PT Frequency  2x / week    PT Duration  6 weeks    PT Treatment/Interventions  Patient/family education;ADLs/Self Care Home Management;Cryotherapy;Electrical Stimulation;Iontophoresis 4mg /ml Dexamethasone;Moist Heat;Ultrasound;Dry needling;Manual techniques;Therapeutic activities;Therapeutic exercise;Neuromuscular re-education    PT Next Visit Plan  Continue postural strengthening.  Manual therapy and modalities as indicated. Continue DN with estim prn added neuromuscular re-ed cues for home to address thoracic posture; tiial of iontophoresis bilat medial epicondylitis     Consulted and Agree with Plan of Care  Patient       Patient will benefit from skilled therapeutic intervention in order to improve the following deficits and impairments:  Postural dysfunction, Improper body mechanics, Pain, Increased fascial restricitons, Increased muscle spasms, Decreased range of motion, Decreased mobility, Decreased strength, Decreased activity tolerance  Visit Diagnosis: Acute pain of left shoulder  Other symptoms  and signs involving the musculoskeletal system  Abnormal posture     Problem List Patient Active Problem List   Diagnosis Date Noted  . Rotator cuff tendonitis,  left 12/14/2016  . Medial epicondylitis 11/02/2016  . Hallux rigidus of right foot 11/02/2016  . Bunionette of right foot 11/02/2016  . Urinary stream splitting 03/20/2016  . Osteoporosis 11/17/2015  . Postmenopausal atrophic vaginitis 02/01/2015  . Vulvar lesion 02/01/2015  . Primary osteoarthritis of left knee 06/26/2014  . Impingement syndrome of right shoulder 03/11/2013  . 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 Pain at left costal margin. 11/15/2011  . ADHD (attention deficit hyperactivity disorder) 12/05/2010  . GENERALIZED ANXIETY DISORDER 02/16/2010  . PAP SMEAR, LGSIL, ABNORMAL 02/16/2010  . DIVERTICULITIS, HX OF 01/22/2010  . ALLERGIC RHINITIS 11/16/2009  . COLONIC POLYPS, HYPERPLASTIC 01/27/2009  . DEPRESSION 02/01/2008    Shirley Moody Nilda Simmer PT, MPH  01/02/2017, 9:43 AM  [facility] [place], [place], [address] Phone: [phone]   Fax:  [phone]  Name: Shirley Moody MRN: [id number] Date of Birth: May 04, 1960

## 2017-01-04 ENCOUNTER — Encounter: Payer: Self-pay | Admitting: Rehabilitative and Restorative Service Providers"

## 2017-01-04 ENCOUNTER — Ambulatory Visit (INDEPENDENT_AMBULATORY_CARE_PROVIDER_SITE_OTHER): Payer: BC Managed Care – PPO | Admitting: Rehabilitative and Restorative Service Providers"

## 2017-01-04 DIAGNOSIS — M25521 Pain in right elbow: Secondary | ICD-10-CM

## 2017-01-04 DIAGNOSIS — R29898 Other symptoms and signs involving the musculoskeletal system: Secondary | ICD-10-CM | POA: Diagnosis not present

## 2017-01-04 DIAGNOSIS — R293 Abnormal posture: Secondary | ICD-10-CM | POA: Diagnosis not present

## 2017-01-04 DIAGNOSIS — M25522 Pain in left elbow: Secondary | ICD-10-CM

## 2017-01-04 DIAGNOSIS — M25512 Pain in left shoulder: Secondary | ICD-10-CM | POA: Diagnosis not present

## 2017-01-04 NOTE — Therapy (Signed)
[facility]  [place], [place], [address] Phone: [phone]   Fax:  [phone]  Physical Therapy Treatment  Patient Details  Name: Shirley Moody MRN: [id number] Date of Birth: 07-03-1960 Referring Provider: Dr. Lynne Leader    Encounter Date: 01/04/2017  PT End of Session - 01/04/17 0834    Visit Number  4    Number of Visits  12    Date for PT Re-Evaluation  02/07/17    PT Start Time  0800    PT Stop Time  0859    PT Time Calculation (min)  59 min    Activity Tolerance  Patient tolerated treatment well       Past Medical History:  Diagnosis Date  . Allergy   . Colon polyp   . Depression   . Diverticulitis   . IBS (irritable bowel syndrome)   . Osteoporosis 11/17/2015   DEXA 2017    Past Surgical History:  Procedure Laterality Date  . DILATION AND CURETTAGE OF UTERUS    . NEUROMA SURGERY     Right foot    There were no vitals filed for this visit.  Subjective Assessment - 01/04/17 0837    Subjective  Improved at times. Exercises yesterday and noticed Lt shoulder pain and Rt elbow pain before the end of the 45 min class.     Currently in Pain?  Yes    Pain Score  2     Pain Location  Shoulder    Pain Orientation  Left    Pain Descriptors / Indicators  Dull;Aching    Pain Type  Acute pain    Pain Radiating Towards  into Lt neck and across the chest to underarm     Pain Onset  More than a month ago    Pain Frequency  Intermittent    Pain Score  2    Pain Location  Elbow    Pain Orientation  Right    Pain Descriptors / Indicators  Tender;Sore    Pain Type  Acute pain    Pain Frequency  Intermittent                      [facility] Adult PT Treatment/Exercise - 01/04/17 0001      Shoulder Exercises: Supine   Other Supine Exercises  prolonged stretch of Lt/Rt pec stretch (arm abd to 90 deg, with wrist ext x 2 min each side.       Shoulder Exercises: Standing   Other  Standing Exercises   With back against wall:  W's with small motion up and back down x 10 reps       Shoulder Exercises: Stretch   Other Shoulder Stretches  low and mid level doorway stretch x 45 sec x 2 reps      Moist Heat Therapy   Number Minutes Moist Heat  20 Minutes    Moist Heat Location  Shoulder;Elbow thoracic; Lt shoulder; Rt elbow       Electrical Stimulation   Electrical Stimulation Location  Lt shoulder girdle     Electrical Stimulation Action  IFC    Electrical Stimulation Parameters  to tolerance    Electrical Stimulation Goals  Tone;Pain      Ultrasound   Ultrasound Location  Lt anterior shoulder     Ultrasound Parameters  1.5 w/cm2; 1 mHz; 100%; 8 min     Ultrasound Goals  Pain;Other (Comment) tightness  Iontophoresis   Type of Iontophoresis  Dexamethasone    Location  bilat medial epicondyles    Dose  1.0 cc each patch    Time  123mA; 8 hr patch      Manual Therapy   Manual therapy comments  pt supine     Soft tissue mobilization  Lt anterior chest/pecs/anterior deltoid/teres    Myofascial Release  Lt pecs; biceps     Passive ROM  Lt shoulder flexion     Kinesiotex  Inhibit Muscle;Facilitate Muscle thoracic to encourage postural control/improved alignment                   PT Long Term Goals - 01/02/17 0930      PT LONG TERM GOAL #1   Title  I with advanced HEP 02/07/17    Time  6    Period  Weeks    Status  On-going      PT LONG TERM GOAL #2   Title  Improve posture and alignment with patient to demonstrate improved upright posture with engagement of posterior shoudler girdle musculature 02/07/17    Time  6    Period  Weeks    Status  On-going      PT LONG TERM GOAL #3   Title  patient reports 50-75% resloution of Lt shoulder girdle pain and bilat elbow pain 02/07/17    Time  6    Period  Weeks    Status  On-going      PT LONG TERM GOAL #4   Title  Increase AROM Lt shoulder to equal AROM Rt shoulder 02/07/17    Time  6    Period   Weeks    Status  On-going      PT LONG TERM GOAL #5   Title  Improve FOTO to </= 33% limitation 02/07/17    Time  6    Period  Weeks    Status  On-going            Plan - 01/04/17 0835    Clinical Impression Statement  Continued symptoms through Lt anterior shoulder and Rt elbow - with symptoms variable in intensity. She continues to have tightness through the anterior chest/pec/anterior deltoid/teres. Gradual progress toward goals of therapy.     Rehab Potential  Good    PT Frequency  2x / week    PT Duration  6 weeks    PT Treatment/Interventions  Patient/family education;ADLs/Self Care Home Management;Cryotherapy;Electrical Stimulation;Iontophoresis 4mg /ml Dexamethasone;Moist Heat;Ultrasound;Dry needling;Manual techniques;Therapeutic activities;Therapeutic exercise;Neuromuscular re-education    PT Next Visit Plan  Continue postural strengthening.  Manual therapy and modalities as indicated. Continue DN with estim prn added neuromuscular re-ed cues for home to address thoracic posture; tiial of iontophoresis bilat medial epicondylitis; trial of tape Lt shoudler to correct forward posture     Consulted and Agree with Plan of Care  Patient       Patient will benefit from skilled therapeutic intervention in order to improve the following deficits and impairments:  Postural dysfunction, Improper body mechanics, Pain, Increased fascial restricitons, Increased muscle spasms, Decreased range of motion, Decreased mobility, Decreased strength, Decreased activity tolerance  Visit Diagnosis: Acute pain of left shoulder  Other symptoms and signs involving the musculoskeletal system  Abnormal posture  Pain in right elbow  Pain in left elbow     Problem List Patient Active Problem List   Diagnosis Date Noted  . Rotator cuff tendonitis, left 12/14/2016  . Medial epicondylitis 11/02/2016  .  Hallux rigidus of right foot 11/02/2016  . Bunionette of right foot 11/02/2016  . Urinary  stream splitting 03/20/2016  . Osteoporosis 11/17/2015  . Postmenopausal atrophic vaginitis 02/01/2015  . Vulvar lesion 02/01/2015  . Primary osteoarthritis of left knee 06/26/2014  . Impingement syndrome of right shoulder 03/11/2013  . 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 Pain at left costal margin. 11/15/2011  . ADHD (attention deficit hyperactivity disorder) 12/05/2010  . GENERALIZED ANXIETY DISORDER 02/16/2010  . PAP SMEAR, LGSIL, ABNORMAL 02/16/2010  . DIVERTICULITIS, HX OF 01/22/2010  . ALLERGIC RHINITIS 11/16/2009  . COLONIC POLYPS, HYPERPLASTIC 01/27/2009  . DEPRESSION 02/01/2008    Ottis Vacha Nilda Simmer PT, MPH  01/04/2017, 8:45 AM  [facility] [place], [place], [address] Phone: [phone]   Fax:  [phone]  Name: Shirley Moody MRN: [id number] Date of Birth: [birth year]

## 2017-01-05 ENCOUNTER — Encounter: Payer: Self-pay | Admitting: Physical Therapy

## 2017-01-12 ENCOUNTER — Ambulatory Visit: Payer: BC Managed Care – PPO | Admitting: Physical Therapy

## 2017-01-12 ENCOUNTER — Encounter: Payer: Self-pay | Admitting: Physical Therapy

## 2017-01-12 DIAGNOSIS — R293 Abnormal posture: Secondary | ICD-10-CM

## 2017-01-12 DIAGNOSIS — M25522 Pain in left elbow: Secondary | ICD-10-CM | POA: Diagnosis not present

## 2017-01-12 DIAGNOSIS — M25512 Pain in left shoulder: Secondary | ICD-10-CM | POA: Diagnosis not present

## 2017-01-12 DIAGNOSIS — R29898 Other symptoms and signs involving the musculoskeletal system: Secondary | ICD-10-CM | POA: Diagnosis not present

## 2017-01-12 DIAGNOSIS — M25521 Pain in right elbow: Secondary | ICD-10-CM | POA: Diagnosis not present

## 2017-01-12 NOTE — Therapy (Signed)
[place], [place], [address] Phone: [phone]   Fax:  [phone]  Physical Therapy Treatment  Patient Details  Name: Shirley Moody MRN: [id number] Date of Birth: Jun 01, 1960 Referring Provider: Dr. Georgina Snell   Encounter Date: 01/12/2017  PT End of Session - 01/12/17 1111    Visit Number  5    Number of Visits  12    Date for PT Re-Evaluation  02/07/17    PT Start Time  0272    PT Stop Time  1120    PT Time Calculation (min)  65 min    Activity Tolerance  Patient limited by pain    Behavior During Therapy  [facility] for tasks assessed/performed       Past Medical History:  Diagnosis Date  . Allergy   . Colon polyp   . Depression   . Diverticulitis   . IBS (irritable bowel syndrome)   . Osteoporosis 11/17/2015   DEXA 2017    Past Surgical History:  Procedure Laterality Date  . DILATION AND CURETTAGE OF UTERUS    . NEUROMA SURGERY     Right foot    There were no vitals filed for this visit.  Subjective Assessment - 01/12/17 1031    Subjective  Pt reports she wasn't able to tolerate ionto patch, "it felt like ants eating me alive".  Pt reports she feels like she is not making much progress over last few weeks.  She also doesn't feel like tape (applied to back of shoulder) has helped much and it hurt removing.     Pertinent History  prior LBP; osteoporosis; Rt rotator cuff pain     Currently in Pain?  Yes    Pain Score  6     Pain Location  Shoulder    Pain Orientation  Left    Pain Descriptors / Indicators  Aching;Sore    Pain Radiating Towards  into Lt neck and under arm     Aggravating Factors   reaching back    Pain Relieving Factors  heat    Pain Score  3    Pain Location  Elbow    Pain Orientation  Right;Left    Pain Descriptors / Indicators  Aching    Aggravating Factors   lifting    Pain Relieving Factors  ??         [facility] PT Assessment - 01/12/17 0001      Assessment    Medical Diagnosis  Lt rotator cuff tendonitis; Lt medial epicondylitis    Referring Provider  Dr. Georgina Snell    Onset Date/Surgical Date  11/04/16    Hand Dominance  Right    Next MD Visit  01/22/17      AROM   Cervical - Right Side Bend  25 with pain in Lt shoulder    Cervical - Left Side Bend  32      Palpation   Palpation comment  tender rib/sternum junction on Rt ant, Lt 1st rib,  Lt ilum and ASIS appear higher;        [facility] Adult PT Treatment/Exercise - 01/12/17 0001      Shoulder Exercises: ROM/Strengthening   UBE (Upper Arm Bike)  L3: 4 min forward/backward (standing)      Shoulder Exercises: Stretch   Other Shoulder Stretches  low and mid level doorway stretch x 30 sec x 3 reps    Other Shoulder Stretches  wrist ext stretch x 30 sec x 2 reps  each side.   dynamic midlevel doorway stretch for 2-sec, x 5 reps.       Moist Heat Therapy   Number Minutes Moist Heat  20 Minutes    Moist Heat Location  Shoulder;Elbow thoracic; Lt shoulder; Lt elbow       Electrical Stimulation   Electrical Stimulation Location  Lt shoulder and Lt elbow    Electrical Stimulation Action  IFC    Electrical Stimulation Parameters   to tolerance     Electrical Stimulation Goals  Tone;Pain      Manual Therapy   Joint Mobilization  light PA mob to Lt/Rt [place] joint, inf/sup (not tolerated); light rib spring Rt/Lt     Soft tissue mobilization  STM to bilat pec, upper trap, scalene.     Myofascial Release  suboccipital release     Passive ROM  pin and stretch with passive neck rotation and lateral flexion     Kinesiotex  Facilitate Muscle      Kinesiotix   Facilitate Muscle   Reg black rock tape applied with 10% stretch from prox bicep origin to medial elbow, with arm placed on light stretch.       Neck Exercises: Stretches   Other Neck Stretches  Lt scalene stretch, 20 sec x 3 reps.           PT Long Term Goals - 01/02/17 0930      PT LONG TERM GOAL #1   Title  I with advanced HEP 02/07/17     Time  6    Period  Weeks    Status  On-going      PT LONG TERM GOAL #2   Title  Improve posture and alignment with patient to demonstrate improved upright posture with engagement of posterior shoudler girdle musculature 02/07/17    Time  6    Period  Weeks    Status  On-going      PT LONG TERM GOAL #3   Title  patient reports 50-75% resloution of Lt shoulder girdle pain and bilat elbow pain 02/07/17    Time  6    Period  Weeks    Status  On-going      PT LONG TERM GOAL #4   Title  Increase AROM Lt shoulder to equal AROM Rt shoulder 02/07/17    Time  6    Period  Weeks    Status  On-going      PT LONG TERM GOAL #5   Title  Improve FOTO to </= 33% limitation 02/07/17    Time  6    Period  Weeks    Status  On-going            Plan - 01/12/17 1509    Clinical Impression Statement  Pt has many notable asymmetries with palpation including elevated Lt 1st rib.  Multiple areas of very point tender musculature including Rt pec, Rt suboccipital muscles, and Lt levator.  She had difficulty tolerating stretches for shoulder and neck due to increased pain in Lt ant shoulder. Rt sidebending more limited than last assessment.  Trial of dynamic pec stretches and Rock tape to [employer] shoulder for decreased shoulder pain.  Encouraged pt to change position of sleep to see if this helps. Limited progress this visit.    Rehab Potential  Good    PT Frequency  2x / week    PT Duration  6 weeks    PT Treatment/Interventions  Patient/family education;ADLs/Self Care Home Management;Cryotherapy;Electrical Stimulation;Iontophoresis  4mg /ml Dexamethasone;Moist Heat;Ultrasound;Dry needling;Manual techniques;Therapeutic activities;Therapeutic exercise;Neuromuscular re-education    PT Next Visit Plan  assess response to [facility] tape application to Lt shoulder.  Manual therapy for elevated Lt 1st rib and Rt neck musculature.         Patient will benefit from skilled therapeutic intervention in order to improve  the following deficits and impairments:  Postural dysfunction, Improper body mechanics, Pain, Increased fascial restricitons, Increased muscle spasms, Decreased range of motion, Decreased mobility, Decreased strength, Decreased activity tolerance  Visit Diagnosis: Acute pain of left shoulder  Other symptoms and signs involving the musculoskeletal system  Abnormal posture  Pain in right elbow  Pain in left elbow     Problem List Patient Active Problem List   Diagnosis Date Noted  . Rotator cuff tendonitis, left 12/14/2016  . Medial epicondylitis 11/02/2016  . Hallux rigidus of right foot 11/02/2016  . Bunionette of right foot 11/02/2016  . Urinary stream splitting 03/20/2016  . Osteoporosis 11/17/2015  . Postmenopausal atrophic vaginitis 02/01/2015  . Vulvar lesion 02/01/2015  . Primary osteoarthritis of left knee 06/26/2014  . Impingement syndrome of right shoulder 03/11/2013  . 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 Pain at left costal margin. 11/15/2011  . ADHD (attention deficit hyperactivity disorder) 12/05/2010  . GENERALIZED ANXIETY DISORDER 02/16/2010  . PAP SMEAR, LGSIL, ABNORMAL 02/16/2010  . DIVERTICULITIS, HX OF 01/22/2010  . ALLERGIC RHINITIS 11/16/2009  . COLONIC POLYPS, HYPERPLASTIC 01/27/2009  . DEPRESSION 02/01/2008   Kerin Perna, PTA 01/12/17 3:32 PM  [place], [place], [address] Phone: [phone]   Fax:  [phone]  Name: Camaryn Lumbert MRN: [id number] Date of Birth: 1960-09-17

## 2017-01-17 ENCOUNTER — Ambulatory Visit: Payer: BC Managed Care – PPO | Admitting: Physical Therapy

## 2017-01-17 DIAGNOSIS — R29898 Other symptoms and signs involving the musculoskeletal system: Secondary | ICD-10-CM | POA: Diagnosis not present

## 2017-01-17 DIAGNOSIS — M25512 Pain in left shoulder: Secondary | ICD-10-CM

## 2017-01-17 DIAGNOSIS — M25522 Pain in left elbow: Secondary | ICD-10-CM

## 2017-01-17 DIAGNOSIS — R293 Abnormal posture: Secondary | ICD-10-CM

## 2017-01-17 NOTE — Therapy (Addendum)
[place], [place], [address] Phone: [phone]   Fax:  [phone]  Physical Therapy Treatment  Patient Details  Name: Shirley Moody MRN: [id number] Date of Birth: [birth year] Referring Provider: Dr. Georgina Snell   Encounter Date: 01/17/2017  PT End of Session - 01/17/17 1116    Visit Number  6    Number of Visits  12    Date for PT Re-Evaluation  02/07/17    PT Start Time  1112    PT Stop Time  1205    PT Time Calculation (min)  53 min    Activity Tolerance  Patient tolerated treatment well    Behavior During Therapy  [facility] [place] for tasks assessed/performed       Past Medical History:  Diagnosis Date  . Allergy   . Colon polyp   . Depression   . Diverticulitis   . IBS (irritable bowel syndrome)   . Osteoporosis 11/17/2015   DEXA 2017    Past Surgical History:  Procedure Laterality Date  . DILATION AND CURETTAGE OF UTERUS    . NEUROMA SURGERY     Right foot    There were no vitals filed for this visit.  Subjective Assessment - 01/17/17 1116    Subjective  Pt reports she did some cleaning around house and worked hard at it. "I was hurting bad the day after our last session, but I worked through the pain with cleaning".    (Pended)     Currently in Pain?  Yes  (Pended)     Pain Score  1   (Pended)     Pain Location  Shoulder  (Pended)          [facility] PT Assessment - 01/17/17 0001      Palpation   Spinal mobility  hypomobile and tender cervical and thoracic spine, especially C5.   assessed by Jeral Pinch, PT    Palpation comment  elevated Lt 1st rib in supine.         [facility] Adult PT Treatment/Exercise - 01/17/17 0001      Shoulder Exercises: Standing   Other Standing Exercises   With back against pool noodle:  W's with small motion up and back down x 15 reps; L's x 10 reps       Shoulder Exercises: ROM/Strengthening   UBE (Upper Arm Bike)  L1: backwards x 3.3 min  standing      Shoulder Exercises: Stretch   Other Shoulder Stretches  low and mid level doorway stretch x 30 sec x 3 reps VC to use legs to adjust strap      Moist Heat Therapy   Number Minutes Moist Heat  15 Minutes    Moist Heat Location  Shoulder;Elbow;Cervical thoracic; Lt shoulder; Lt elbow       Acupuncturist Location  bilat cervical paraspinals and Lt shoudler    Electrical Stimulation Action  IFC    Electrical Stimulation Parameters  to tolerance     Electrical Stimulation Goals  Pain      Manual Therapy   Joint Mobilization  Grade II CPA for C5, Grade III Lt UPA on articular pilar ; Grade II C7 on bilat transverse process.  performed by Jeral Pinch, PT    Soft tissue mobilization  STM to Rt cervical paraspinals, bilat upper traps; cross fiber friction to thoracic paraspinals in prone. STM to Lt tricep, posterior shoulder girdle in prone    Myofascial Release  suboccipital release, MFR to platysma, scalenes, upper trap.      Passive ROM  pin and stretch with passive neck rotation and lateral flexion       Neck Exercises: Stretches   Other Neck Stretches  Lt scalene stretch x 15 sec x 3 reps                  PT Long Term Goals - 01/02/17 0930      PT LONG TERM GOAL #1   Title  I with advanced HEP 02/07/17    Time  6    Period  Weeks    Status  On-going      PT LONG TERM GOAL #2   Title  Improve posture and alignment with patient to demonstrate improved upright posture with engagement of posterior shoudler girdle musculature 02/07/17    Time  6    Period  Weeks    Status  On-going      PT LONG TERM GOAL #3   Title  patient reports 50-75% resloution of Lt shoulder girdle pain and bilat elbow pain 02/07/17    Time  6    Period  Weeks    Status  On-going      PT LONG TERM GOAL #4   Title  Increase AROM Lt shoulder to equal AROM Rt shoulder 02/07/17    Time  6    Period  Weeks    Status  On-going      PT LONG TERM GOAL #5   Title   Improve FOTO to </= 33% limitation 02/07/17    Time  6    Period  Weeks    Status  On-going            Plan - 01/17/17 1308    Clinical Impression Statement  Pt continues with Lt shoulder Pain with activities; less today than in the past.  Pt observed with improved Lt neck rotation ROM after spinal mobilization.  Pt reported reduction in pain at end of session.  Making gradual progress towards goals.     Rehab Potential  Good    PT Frequency  2x / week    PT Duration  6 weeks    PT Treatment/Interventions  Patient/family education;ADLs/Self Care Home Management;Cryotherapy;Electrical Stimulation;Iontophoresis 4mg /ml Dexamethasone;Moist Heat;Ultrasound;Dry needling;Manual techniques;Therapeutic activities;Therapeutic exercise;Neuromuscular re-education    PT Next Visit Plan  spinal mobilizations.      Consulted and Agree with Plan of Care  Patient       Patient will benefit from skilled therapeutic intervention in order to improve the following deficits and impairments:  Postural dysfunction, Improper body mechanics, Pain, Increased fascial restricitons, Increased muscle spasms, Decreased range of motion, Decreased mobility, Decreased strength, Decreased activity tolerance  Visit Diagnosis: Acute pain of left shoulder  Other symptoms and signs involving the musculoskeletal system  Abnormal posture  Pain in left elbow     Problem List Patient Active Problem List   Diagnosis Date Noted  . Rotator cuff tendonitis, left 12/14/2016  . Medial epicondylitis 11/02/2016  . Hallux rigidus of right foot 11/02/2016  . Bunionette of right foot 11/02/2016  . Urinary stream splitting 03/20/2016  . Osteoporosis 11/17/2015  . Postmenopausal atrophic vaginitis 02/01/2015  . Vulvar lesion 02/01/2015  . Primary osteoarthritis of left knee 06/26/2014  . Impingement syndrome of right shoulder 03/11/2013  . 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 Pain at left costal margin. 11/15/2011  . ADHD  (attention deficit hyperactivity disorder) 12/05/2010  . GENERALIZED ANXIETY  DISORDER 02/16/2010  . PAP SMEAR, LGSIL, ABNORMAL 02/16/2010  . DIVERTICULITIS, HX OF 01/22/2010  . ALLERGIC RHINITIS 11/16/2009  . COLONIC POLYPS, HYPERPLASTIC 01/27/2009  . DEPRESSION 02/01/2008   Kerin Perna, PTA 01/17/17 1:13 PM  Jeral Pinch, PT 01/17/17 4:49 PM  [place], [place], [address] Phone: [phone]   Fax:  [phone]  Name: Lakena Sparlin MRN: [id number] Date of Birth: [birth year]

## 2017-01-19 ENCOUNTER — Ambulatory Visit (INDEPENDENT_AMBULATORY_CARE_PROVIDER_SITE_OTHER): Payer: BC Managed Care – PPO | Admitting: Rehabilitative and Restorative Service Providers"

## 2017-01-19 ENCOUNTER — Encounter: Payer: Self-pay | Admitting: Rehabilitative and Restorative Service Providers"

## 2017-01-19 DIAGNOSIS — M25521 Pain in right elbow: Secondary | ICD-10-CM | POA: Diagnosis not present

## 2017-01-19 DIAGNOSIS — M25512 Pain in left shoulder: Secondary | ICD-10-CM

## 2017-01-19 DIAGNOSIS — R29898 Other symptoms and signs involving the musculoskeletal system: Secondary | ICD-10-CM

## 2017-01-19 DIAGNOSIS — M546 Pain in thoracic spine: Secondary | ICD-10-CM | POA: Diagnosis not present

## 2017-01-19 DIAGNOSIS — R293 Abnormal posture: Secondary | ICD-10-CM | POA: Diagnosis not present

## 2017-01-19 DIAGNOSIS — M25522 Pain in left elbow: Secondary | ICD-10-CM

## 2017-01-19 NOTE — Therapy (Signed)
[place], [place], [address] Phone: [phone]   Fax:  [phone]  Physical Therapy Treatment  Patient Details  Name: Shirley Moody MRN: [id number] Date of Birth: Oct 08, 1960 Referring Provider: Dr. Georgina Snell   Encounter Date: 01/19/2017  PT End of Session - 01/19/17 1152    Visit Number  7    Number of Visits  12    Date for PT Re-Evaluation  02/07/17    PT Start Time  1150    PT Stop Time  1253    PT Time Calculation (min)  63 min    Activity Tolerance  Patient tolerated treatment well       Past Medical History:  Diagnosis Date  . Allergy   . Colon polyp   . Depression   . Diverticulitis   . IBS (irritable bowel syndrome)   . Osteoporosis 11/17/2015   DEXA 2017    Past Surgical History:  Procedure Laterality Date  . DILATION AND CURETTAGE OF UTERUS    . NEUROMA SURGERY     Right foot    There were no vitals filed for this visit.  Subjective Assessment - 01/19/17 1152    Subjective  Neck and shoulder felt better after last treatment. Felt less pain up until 5#0 that day then pain increased again. Did sleep better after treatment. She has some pain in the shoulder area today.     Currently in Pain?  Yes    Pain Score  2     Pain Location  Shoulder    Pain Orientation  Left    Pain Descriptors / Indicators  Aching;Sore    Pain Type  Acute pain    Pain Onset  More than a month ago    Pain Frequency  Intermittent                      [facility] Adult PT Treatment/Exercise - 01/19/17 0001      Shoulder Exercises: Supine   Other Supine Exercises  axial extension/chin tuck 10 sec x 5       Shoulder Exercises: Standing   Other Standing Exercises   With noodle:  W's x 15 reps; L's x 15 reps       Shoulder Exercises: ROM/Strengthening   UBE (Upper Arm Bike)  L2 x 2 min fwd; 2 min back      Shoulder Exercises: Stretch   Other Shoulder Stretches  low and mid level  doorway stretch x 30 sec x 3 reps      Moist Heat Therapy   Number Minutes Moist Heat  20 Minutes    Moist Heat Location  Shoulder;Cervical thoracic and anterior chest wall       Electrical Stimulation   Electrical Stimulation Location  bilat cervical paraspinals and bilat anteriorshoudler/clavicular area     Electrical Stimulation Action  IFC    Electrical Stimulation Parameters  to tolerance    Electrical Stimulation Goals  Pain;Tone      Manual Therapy   Manual therapy comments  pt supine     Joint Mobilization  Cervical and upper thoracic mobs Grade II/III CPA and unilateral mobs; mobilization of clavicle bilat     Soft tissue mobilization  deep tissue work bilat cervical; upper traps; pecs; anterior cervical musculature     Myofascial Release  suboccipital release, MFR to platysma, scalenes, upper trap.      Passive ROM  cervical flexion  Neck Exercises: Stretches   Other Neck Stretches  Lt and Rt scalene stretch x 15 sec x 3 reps                  PT Long Term Goals - 01/02/17 0930      PT LONG TERM GOAL #1   Title  I with advanced HEP 02/07/17    Time  6    Period  Weeks    Status  On-going      PT LONG TERM GOAL #2   Title  Improve posture and alignment with patient to demonstrate improved upright posture with engagement of posterior shoudler girdle musculature 02/07/17    Time  6    Period  Weeks    Status  On-going      PT LONG TERM GOAL #3   Title  patient reports 50-75% resloution of Lt shoulder girdle pain and bilat elbow pain 02/07/17    Time  6    Period  Weeks    Status  On-going      PT LONG TERM GOAL #4   Title  Increase AROM Lt shoulder to equal AROM Rt shoulder 02/07/17    Time  6    Period  Weeks    Status  On-going      PT LONG TERM GOAL #5   Title  Improve FOTO to </= 33% limitation 02/07/17    Time  6    Period  Weeks    Status  On-going            Plan - 01/19/17 1154    Clinical Impression Statement  Good response to  manual work through the cervical pain including cervical mobs. Patient has less pain and is sleeping better. Will continue with manual work and spine stabilization progressing toward goals of therapy.   (Pended)     Rehab Potential  Good  (Pended)     PT Frequency  2x / week  (Pended)     PT Duration  6 weeks  (Pended)     PT Treatment/Interventions  Patient/family education;ADLs/Self Care Home Management;Cryotherapy;Electrical Stimulation;Iontophoresis 4mg /ml Dexamethasone;Moist Heat;Ultrasound;Dry needling;Manual techniques;Therapeutic activities;Therapeutic exercise;Neuromuscular re-education  (Pended)     PT Next Visit Plan  spinal mobilizations.    (Pended)        Patient will benefit from skilled therapeutic intervention in order to improve the following deficits and impairments:  (P) Postural dysfunction, Improper body mechanics, Pain, Increased fascial restricitons, Increased muscle spasms, Decreased range of motion, Decreased mobility, Decreased strength, Decreased activity tolerance  Visit Diagnosis: Acute pain of left shoulder  Other symptoms and signs involving the musculoskeletal system  Abnormal posture  Pain in left elbow  Pain in right elbow  Pain in thoracic spine     Problem List Patient Active Problem List   Diagnosis Date Noted  . Rotator cuff tendonitis, left 12/14/2016  . Medial epicondylitis 11/02/2016  . Hallux rigidus of right foot 11/02/2016  . Bunionette of right foot 11/02/2016  . Urinary stream splitting 03/20/2016  . Osteoporosis 11/17/2015  . Postmenopausal atrophic vaginitis 02/01/2015  . Vulvar lesion 02/01/2015  . Primary osteoarthritis of left knee 06/26/2014  . Impingement syndrome of right shoulder 03/11/2013  . 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 Pain at left costal margin. 11/15/2011  . ADHD (attention deficit hyperactivity disorder) 12/05/2010  . GENERALIZED ANXIETY DISORDER 02/16/2010  . PAP SMEAR, LGSIL, ABNORMAL 02/16/2010  .  DIVERTICULITIS, HX OF 01/22/2010  . ALLERGIC RHINITIS 11/16/2009  .  COLONIC POLYPS, HYPERPLASTIC 01/27/2009  . DEPRESSION 02/01/2008    Brynlyn Dade Nilda Simmer PT, MPH  01/19/2017, 12:40 PM  [facility] [place], [place], [address] Phone: [phone]   Fax:  [phone]  Name: Shirley Moody MRN: [id number] Date of Birth: [birth year]

## 2017-01-22 ENCOUNTER — Ambulatory Visit (INDEPENDENT_AMBULATORY_CARE_PROVIDER_SITE_OTHER): Payer: BC Managed Care – PPO

## 2017-01-22 ENCOUNTER — Encounter: Payer: Self-pay | Admitting: Family Medicine

## 2017-01-22 ENCOUNTER — Ambulatory Visit (INDEPENDENT_AMBULATORY_CARE_PROVIDER_SITE_OTHER): Payer: BC Managed Care – PPO | Admitting: Family Medicine

## 2017-01-22 VITALS — BP 123/77 | HR 60 | Ht 64.0 in | Wt 128.0 lb

## 2017-01-22 DIAGNOSIS — M7701 Medial epicondylitis, right elbow: Secondary | ICD-10-CM | POA: Diagnosis not present

## 2017-01-22 DIAGNOSIS — M25512 Pain in left shoulder: Secondary | ICD-10-CM | POA: Diagnosis not present

## 2017-01-22 DIAGNOSIS — M7582 Other shoulder lesions, left shoulder: Secondary | ICD-10-CM

## 2017-01-22 NOTE — Progress Notes (Signed)
Shirley Moody is a 57 y.o. female who presents to [facility] today for follow up of left shoulder and right elbow pain.  In terms of her left shoulder, patient has had ongoing pain and limitation in activity since September. She has been attending physical therapy regularly with some improvement. Last week, she was in a significant amount of pain. Went to PT on Wednesday and they did massaged her neck and paraspinal muscles extensively, resulting in some relief. Patient said, she felt best that she had in a long time after that session with significantly less pain and improved range of motion. Otherwise, patient's day-to-day activities still painful including lifting objects, reaching over her head, and sleeping on her side. She has tried to accomodate her activities as much as possible, but is not improving to the level she would like.  Her right medial epicondylitis has improved since last visit. Significantly less tender than prior with better tolerance for flexion and extension of her wrist. Will continue home exercise. Pleased with her progress at this point.  Past Medical History:  Diagnosis Date  . Allergy   . Colon polyp   . Depression   . Diverticulitis   . IBS (irritable bowel syndrome)   . Osteoporosis 11/17/2015   DEXA 2017   Past Surgical History:  Procedure Laterality Date  . DILATION AND CURETTAGE OF UTERUS    . NEUROMA SURGERY     Right foot   Social History   Tobacco Use  . Smoking status: Former Research scientist (life sciences)  . Smokeless tobacco: Never Used  Substance Use Topics  . Alcohol use: Yes    Alcohol/week: 0.0 oz    Comment: 1 drink/month     ROS:  As above   Medications: Current Outpatient Medications  Medication Sig Dispense Refill  . alendronate (FOSAMAX) 70 MG tablet TAKE 1 TABLET(70 MG) BY MOUTH 1 TIME A WEEK WITH A FULL GLASS OF WATER AND ON AN EMPTY STOMACH 12 tablet 4  . buPROPion (WELLBUTRIN XL) 150 MG 24 hr  tablet Take 450 mg by mouth daily.     . Calcium Carbonate (CALCIUM 600 PO) Take 600 mg by mouth.    . cetirizine (ZYRTEC) 10 MG tablet Take 10 mg by mouth daily.      . cyclobenzaprine (FLEXERIL) 5 MG tablet Take 1-2 tablets (5-10 mg total) by mouth at bedtime as needed for muscle spasms. 30 tablet 1  . diclofenac sodium (VOLTAREN) 1 % GEL Apply 2 g topically 4 (four) times daily. To affected joint. 100 g 11  . Estradiol 10 MCG TABS vaginal tablet Insert one tablet vaginally twice a week. 30 tablet 2  . FLUoxetine (PROZAC) 10 MG capsule TK 1 C PO D  2  . Magnesium 250 MG TABS Take by mouth.    . mometasone (NASONEX) 50 MCG/ACT nasal spray SHAKE LIQUID AND USE 1 SPRAY IN EACH NOSTRIL EVERY DAY AS DIRECTED 17 g 4  . nitroGLYCERIN (NITRODUR - DOSED IN MG/24 HR) 0.2 mg/hr patch 1/4 patch to tendon daily for tendonitis 30 patch 1  . Probiotic Product (PROBIOTIC PO) Take by mouth.    . traZODone (DESYREL) 100 MG tablet      No current facility-administered medications for this visit.    Allergies  Allergen Reactions  . Biaxin [Clarithromycin]     "gives me lockjaw"  . Concerta [Methylphenidate] Other (See Comments)    Says affected her sleep, made her jittery and would suck in her lips  .  Penicillins     "amoxicillin doesn't do anything for me anymore so I don't want it'      Exam:  BP 123/77   Pulse 60   Ht 5\' 4"  (1.626 m)   Wt 128 lb (58.1 kg)   LMP 04/05/2011   BMI 21.97 kg/m  General: Well Developed, well nourished, and in no acute distress.  Neuro/Psych: Alert and oriented x3, extra-ocular muscles intact, able to move all 4 extremities, sensation grossly intact. Skin: Warm and dry, no rashes noted.  Respiratory: Not using accessory muscles, speaking in full sentences, trachea midline.  Cardiovascular: Pulses palpable, no extremity edema. Abdomen: Does not appear distended. MSK:  Left shoulder: Normal appearing.  ROM limited slightly due to pain, primarily with shoulder  extension. Positive Hawkins, negative neer's. Positive empty can. Negative yergason's and speeds test.  Right elbow: Minimally tender over medial epicondyle. No pain with wrist flexion or extension.  Normal elbow motion.    No results found for this or any previous visit (from the past 48 hour(s)). Dg Shoulder Left  Result Date: 01/22/2017 CLINICAL DATA:  Onset of left shoulder pain 18 months ago. The pain radiates into the left ribs, left arm, and left ear. EXAM: LEFT SHOULDER - 2+ VIEW COMPARISON:  Chest x-ray dated October 13, 2015 which included a portion of the left shoulder. FINDINGS: The bones are subjectively adequately mineralized. There is no acute or healing fracture. The glenohumeral and AC joint spaces are reasonably well-maintained. There are no abnormal soft tissue calcifications. IMPRESSION: There is no acute or significant chronic bony abnormality of the left shoulder. Electronically Signed   By: David  Martinique M.D.   On: 01/22/2017 09:10    Assessment and Plan: 57 y.o. female with left rotator cuff tendinopathy. Patient has nearly exhausted conservative management, with >6 weeks of physical therapy, pain management, and home exercises. Patient amenable to escalating investigation while continuing physical therapy.   Will begin with Xray and then likely MRI in 2 weeks. Patient instructed to schedule follow-up appointment to discuss MRI results. Pending results, will consider further action. Patient will continue physical therapy as that has been helpful, especially in the last week.  Otherwise, right elbow is improving. Patient will continue home exercises for this issue. Follow up if necessary.    Orders Placed This Encounter  Procedures  . DG Shoulder Left    Standing Status:   Future    Number of Occurrences:   1    Standing Expiration Date:   03/23/2018    Order Specific Question:   Reason for Exam (SYMPTOM  OR DIAGNOSIS REQUIRED)    Answer:   eval chronic pain  likely RTC    Order Specific Question:   Is patient pregnant?    Answer:   No    Order Specific Question:   Preferred imaging location?    Answer:   [facility]    Order Specific Question:   Radiology Contrast Protocol - do NOT remove file path    Answer:   [email or web address]  . MR Shoulder Left Wo Contrast    Standing Status:   Future    Standing Expiration Date:   03/23/2018    Order Specific Question:   What is the patient's sedation requirement?    Answer:   No Sedation    Order Specific Question:   Does the patient have a pacemaker or implanted devices?    Answer:   No    Order Specific Question:   Preferred imaging  location?    Answer:   [facility] (table limit-350lbs)    Order Specific Question:   Radiology Contrast Protocol - do NOT remove file path    Answer:   [email or web address]    Order Specific Question:   Is patient pregnant?    Answer:   No   No orders of the defined types were placed in this encounter.   Discussed warning signs or symptoms. Please see discharge instructions. Patient expresses understanding.  I spent 25 minutes with this patient, greater than 50% was face-to-face time counseling regarding treatment plan.

## 2017-01-22 NOTE — Patient Instructions (Signed)
Thank you for coming in today. Get xray today.  You should hear about MRI soon.  Let me know if you do not hear anything by the end of the week.  Follow up with a me a few days after the MRI.  Return sooner if needed.     Shoulder Impingement Syndrome Shoulder impingement syndrome is a condition that causes pain when connective tissues (tendons) surrounding the shoulder joint become pinched. These tendons are part of the group of muscles and tissues that help to stabilize the shoulder (rotator cuff). Beneath the rotator cuff is a fluid-filled sac (bursa) that allows the muscles and tendons to glide smoothly. The bursa may become swollen or irritated (bursitis). Bursitis, swelling in the rotator cuff tendons, or both conditions can decrease how much space is under a bone in the shoulder joint (acromion), resulting in impingement. What are the causes? Shoulder impingement syndrome can be caused by bursitis or swelling of the rotator cuff tendons, which may result from:  Repetitive overhead arm movements.  Falling onto the shoulder.  Weakness in the shoulder muscles.  What increases the risk? You may be more likely to develop this condition if you are an athlete who participates in:  Sports that involve throwing, such as baseball.  Tennis.  Swimming.  Volleyball.  Some people are also more likely to develop impingement syndrome because of the shape of their acromion bone. What are the signs or symptoms? The main symptom of this condition is pain on the front or side of the shoulder. Pain may:  Get worse when lifting or raising the arm.  Get worse at night.  Wake you up from sleeping.  Feel sharp when the shoulder is moved, and then fade to an ache.  Other signs and symptoms may include:  Tenderness.  Stiffness.  Inability to raise the arm above shoulder level or behind the body.  Weakness.  How is this diagnosed? This condition may be diagnosed based on:  Your  symptoms.  Your medical history.  A physical exam.  Imaging tests, such as: ? X-rays. ? MRI. ? Ultrasound.  How is this treated? Treatment for this condition may include:  Resting your shoulder and avoiding all activities that cause pain or put stress on the shoulder.  Icing your shoulder.  NSAIDs to help reduce pain and swelling.  One or more injections of medicines to numb the area and reduce inflammation.  Physical therapy.  Surgery. This may be needed if nonsurgical treatments have not helped. Surgery may involve repairing the rotator cuff, reshaping the acromion, or removing the bursa.  Follow these instructions at home: Managing pain, stiffness, and swelling  If directed, apply ice to the injured area. ? Put ice in a plastic bag. ? Place a towel between your skin and the bag. ? Leave the ice on for 20 minutes, 2-3 times a day. Activity  Rest and return to your normal activities as told by your health care provider. Ask your health care provider what activities are safe for you.  Do exercises as told by your health care provider. General instructions  Do not use any tobacco products, including cigarettes, chewing tobacco, or e-cigarettes. Tobacco can delay healing. If you need help quitting, ask your health care provider.  Ask your health care provider when it is safe for you to drive.  Take over-the-counter and prescription medicines only as told by your health care provider.  Keep all follow-up visits as told by your health care provider. This is  important. How is this prevented?  Give your body time to rest between periods of activity.  Be safe and responsible while being active to avoid falls.  Maintain physical fitness, including strength and flexibility. Contact a health care provider if:  Your symptoms have not improved after 1-2 months of treatment and rest.  You cannot lift your arm away from your body. This information is not intended to  replace advice given to you by your health care provider. Make sure you discuss any questions you have with your health care provider. Document Released: 01/02/2005 Document Revised: 09/09/2015 Document Reviewed: 12/05/2014 Elsevier Interactive Patient Education  [employer].

## 2017-01-24 ENCOUNTER — Ambulatory Visit: Payer: BC Managed Care – PPO | Admitting: Rehabilitative and Restorative Service Providers"

## 2017-01-24 ENCOUNTER — Encounter: Payer: Self-pay | Admitting: Rehabilitative and Restorative Service Providers"

## 2017-01-24 DIAGNOSIS — M25522 Pain in left elbow: Secondary | ICD-10-CM

## 2017-01-24 DIAGNOSIS — R29898 Other symptoms and signs involving the musculoskeletal system: Secondary | ICD-10-CM | POA: Diagnosis not present

## 2017-01-24 DIAGNOSIS — M25512 Pain in left shoulder: Secondary | ICD-10-CM | POA: Diagnosis not present

## 2017-01-24 DIAGNOSIS — R293 Abnormal posture: Secondary | ICD-10-CM | POA: Diagnosis not present

## 2017-01-24 DIAGNOSIS — M25521 Pain in right elbow: Secondary | ICD-10-CM

## 2017-01-24 NOTE — Therapy (Signed)
[place], [place], [address] Phone: [phone]   Fax:  [phone]  Physical Therapy Treatment  Patient Details  Name: Shirley Moody MRN: [id number] Date of Birth: July 27, 1960 Referring Provider: Dr. Georgina Snell   Encounter Date: 01/24/2017  PT End of Session - 01/24/17 1436    Visit Number  8    Number of Visits  12    Date for PT Re-Evaluation  02/07/17    PT Start Time  7026    PT Stop Time  1529    PT Time Calculation (min)  58 min    Activity Tolerance  Patient tolerated treatment well       Past Medical History:  Diagnosis Date  . Allergy   . Colon polyp   . Depression   . Diverticulitis   . IBS (irritable bowel syndrome)   . Osteoporosis 11/17/2015   DEXA 2017    Past Surgical History:  Procedure Laterality Date  . DILATION AND CURETTAGE OF UTERUS    . NEUROMA SURGERY     Right foot    There were no vitals filed for this visit.  Subjective Assessment - 01/24/17 1437    Subjective  Seen by MD Monday - xrays showed no RC tear. Pain continues to improve - positive resonse to last visit with more manual work through [employer] shoulder girdle     Currently in Pain?  Yes    Pain Score  1     Pain Location  Shoulder    Pain Orientation  Left    Pain Descriptors / Indicators  Aching;Sore    Pain Type  Acute pain    Pain Score  0    Pain Location  Elbow    Pain Descriptors / Indicators  Sore                      [facility] Adult PT Treatment/Exercise - 01/24/17 0001      Shoulder Exercises: Supine   Other Supine Exercises  thoracic extension with 3 inch noodle horozontally across back 2-3 min     Other Supine Exercises  axial extension/chin tuck 10 sec x 5       Shoulder Exercises: Standing   Other Standing Exercises   With noodle:  W's x 15 reps; L's x 15 reps       Shoulder Exercises: ROM/Strengthening   UBE (Upper Arm Bike)  L2 x 2 min fwd; 2 min back      Shoulder  Exercises: Stretch   Other Shoulder Stretches  low and mid level doorway stretch x 30 sec x 3 repsadded higher level gently to patient tolerance       Moist Heat Therapy   Number Minutes Moist Heat  20 Minutes    Moist Heat Location  Shoulder;Cervical thoracic and anterior chest wall       Electrical Stimulation   Electrical Stimulation Location  bilat cervical paraspinals and bilat anteriorshoudler/clavicular area     Electrical Stimulation Action  IFC    Electrical Stimulation Parameters  to tolerance    Electrical Stimulation Goals  Pain;Tone      Manual Therapy   Manual therapy comments  pt prone and supine     Joint Mobilization  prone mid thoracic CPA mobs - supine Cervical and upper thoracic mobs Grade II/III CPA and unilateral mobs; mobilization of clavicle bilat     Soft tissue mobilization  deep tissue work bilat cervical; upper traps;  pecs; anterior cervical musculature     Myofascial Release  suboccipital release, MFR to platysma, scalenes, upper trap.      Passive ROM  cervical flexion                   PT Long Term Goals - 01/24/17 1437      PT LONG TERM GOAL #1   Title  I with advanced HEP 02/07/17    Time  6    Period  Weeks    Status  On-going      PT LONG TERM GOAL #2   Title  Improve posture and alignment with patient to demonstrate improved upright posture with engagement of posterior shoudler girdle musculature 02/07/17    Time  6    Period  Weeks    Status  On-going      PT LONG TERM GOAL #3   Title  patient reports 50-75% resloution of Lt shoulder girdle pain and bilat elbow pain 02/07/17    Time  6    Period  Weeks    Status  On-going      PT LONG TERM GOAL #4   Title  Increase AROM Lt shoulder to equal AROM Rt shoulder 02/07/17    Time  6    Period  Weeks    Status  On-going      PT LONG TERM GOAL #5   Title  Improve FOTO to </= 33% limitation 02/07/17    Time  6    Period  Weeks    Status  On-going            Plan - 01/24/17  1443    Clinical Impression Statement  Good response to manual work through [employer] upper quarter and passive stretching. Decreased pain in the Lt shoulder and Lt UE. Progressing gradually toward stated goals of therapy.     Rehab Potential  Good    PT Frequency  2x / week    PT Duration  6 weeks    PT Treatment/Interventions  Patient/family education;ADLs/Self Care Home Management;Cryotherapy;Electrical Stimulation;Iontophoresis 4mg /ml Dexamethasone;Moist Heat;Ultrasound;Dry needling;Manual techniques;Therapeutic activities;Therapeutic exercise;Neuromuscular re-education    PT Next Visit Plan  spinal mobilizations, manual work thoracic spine and Lt upper quarter including cervical spine .      Consulted and Agree with Plan of Care  Patient       Patient will benefit from skilled therapeutic intervention in order to improve the following deficits and impairments:  Postural dysfunction, Improper body mechanics, Pain, Increased fascial restricitons, Increased muscle spasms, Decreased range of motion, Decreased mobility, Decreased strength, Decreased activity tolerance  Visit Diagnosis: Acute pain of left shoulder  Other symptoms and signs involving the musculoskeletal system  Abnormal posture  Pain in left elbow  Pain in right elbow     Problem List Patient Active Problem List   Diagnosis Date Noted  . Rotator cuff tendonitis, left 12/14/2016  . Medial epicondylitis 11/02/2016  . Hallux rigidus of right foot 11/02/2016  . Bunionette of right foot 11/02/2016  . Urinary stream splitting 03/20/2016  . Osteoporosis 11/17/2015  . Postmenopausal atrophic vaginitis 02/01/2015  . Vulvar lesion 02/01/2015  . Primary osteoarthritis of left knee 06/26/2014  . Impingement syndrome of right shoulder 03/11/2013  . 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 Pain at left costal margin. 11/15/2011  . ADHD (attention deficit hyperactivity disorder) 12/05/2010  . GENERALIZED ANXIETY DISORDER 02/16/2010  .  PAP SMEAR, LGSIL, ABNORMAL 02/16/2010  . DIVERTICULITIS, HX OF 01/22/2010  .  ALLERGIC RHINITIS 11/16/2009  . COLONIC POLYPS, HYPERPLASTIC 01/27/2009  . DEPRESSION 02/01/2008    Tecumseh Yeagley Nilda Simmer PT, MPH  01/24/2017, 3:21 PM  [facility] [place], [place], [address] Phone: [phone]   Fax:  [phone]  Name: Laelyn Blumenthal MRN: [id number] Date of Birth: February 25, 1960

## 2017-01-26 ENCOUNTER — Encounter: Payer: Self-pay | Admitting: Rehabilitative and Restorative Service Providers"

## 2017-01-26 ENCOUNTER — Ambulatory Visit: Payer: BC Managed Care – PPO | Admitting: Rehabilitative and Restorative Service Providers"

## 2017-01-26 DIAGNOSIS — R293 Abnormal posture: Secondary | ICD-10-CM

## 2017-01-26 DIAGNOSIS — M25512 Pain in left shoulder: Secondary | ICD-10-CM

## 2017-01-26 DIAGNOSIS — M25522 Pain in left elbow: Secondary | ICD-10-CM

## 2017-01-26 DIAGNOSIS — R29898 Other symptoms and signs involving the musculoskeletal system: Secondary | ICD-10-CM

## 2017-01-26 DIAGNOSIS — M25521 Pain in right elbow: Secondary | ICD-10-CM

## 2017-01-26 DIAGNOSIS — M546 Pain in thoracic spine: Secondary | ICD-10-CM

## 2017-01-26 NOTE — Therapy (Signed)
[place], [place], [address] Phone: [phone]   Fax:  [phone]  Physical Therapy Treatment  Patient Details  Name: Shirley Moody MRN: [id number] Date of Birth: [birth year] Referring Provider: Dr. Georgina Snell   Encounter Date: 01/26/2017  PT End of Session - 01/26/17 1401    Visit Number  9    Number of Visits  12    Date for PT Re-Evaluation  02/07/17    PT Start Time  1325    PT Stop Time  1413    PT Time Calculation (min)  48 min    Activity Tolerance  Patient tolerated treatment well       Past Medical History:  Diagnosis Date  . Allergy   . Colon polyp   . Depression   . Diverticulitis   . IBS (irritable bowel syndrome)   . Osteoporosis 11/17/2015   DEXA 2017    Past Surgical History:  Procedure Laterality Date  . DILATION AND CURETTAGE OF UTERUS    . NEUROMA SURGERY     Right foot    There were no vitals filed for this visit.  Subjective Assessment - 01/26/17 1359    Subjective  Continues to be better than she was 2 weeks ago but has some increased soreness in the shoulder area today - maybe from manual work at last visit. Overall pleased with progress.     Currently in Pain?  Yes    Pain Score  2     Pain Location  Shoulder    Pain Orientation  Left    Pain Descriptors / Indicators  Aching;Sore                      [facility] Adult PT Treatment/Exercise - 01/26/17 0001      Shoulder Exercises: Supine   Other Supine Exercises  axial extension/chin tuck 10 sec x 5       Shoulder Exercises: Standing   Other Standing Exercises   With noodle:  W's x 15 reps; L's x 15 reps       Shoulder Exercises: Stretch   Other Shoulder Stretches  low and mid level doorway stretch x 30 sec x 3 repsadded higher level gently to patient tolerance       Moist Heat Therapy   Number Minutes Moist Heat  20 Minutes    Moist Heat Location  Shoulder;Cervical thoracic and anterior  chest wall       Electrical Stimulation   Electrical Stimulation Location  Lt ant shd; Lt ant chest; Lt lateral cervical; Lt mid thoracic     Electrical Stimulation Action  IFC    Electrical Stimulation Parameters  to tolerance    Electrical Stimulation Goals  Pain;Tone      Manual Therapy   Manual therapy comments  pt prone and supine     Joint Mobilization  prone mid thoracic CPA mobs - supine Cervical and upper thoracic mobs Grade II/III CPA and unilateral mobs; mobilization of clavicle bilat     Soft tissue mobilization  deep tissue work bilat cervical; upper traps; pecs; anterior cervical musculature     Myofascial Release  suboccipital release, MFR to platysma, scalenes, upper trap.      Passive ROM  cervical flexion       Neck Exercises: Stretches   Other Neck Stretches  Lt and Rt scalene stretch x 15 sec x 3 reps  PT Long Term Goals - 01/24/17 1437      PT LONG TERM GOAL #1   Title  I with advanced HEP 02/07/17    Time  6    Period  Weeks    Status  On-going      PT LONG TERM GOAL #2   Title  Improve posture and alignment with patient to demonstrate improved upright posture with engagement of posterior shoudler girdle musculature 02/07/17    Time  6    Period  Weeks    Status  On-going      PT LONG TERM GOAL #3   Title  patient reports 50-75% resloution of Lt shoulder girdle pain and bilat elbow pain 02/07/17    Time  6    Period  Weeks    Status  On-going      PT LONG TERM GOAL #4   Title  Increase AROM Lt shoulder to equal AROM Rt shoulder 02/07/17    Time  6    Period  Weeks    Status  On-going      PT LONG TERM GOAL #5   Title  Improve FOTO to </= 33% limitation 02/07/17    Time  6    Period  Weeks    Status  On-going            Plan - 01/26/17 1402    Clinical Impression Statement  Improvement in tissue extensibility noted through the Lt clavicular and thoracic spine area. Some increased soreness in the shoulder area which  may be related to manual work.     Rehab Potential  Good    PT Frequency  2x / week    PT Duration  6 weeks    PT Treatment/Interventions  Patient/family education;ADLs/Self Care Home Management;Cryotherapy;Electrical Stimulation;Iontophoresis 4mg /ml Dexamethasone;Moist Heat;Ultrasound;Dry needling;Manual techniques;Therapeutic activities;Therapeutic exercise;Neuromuscular re-education    PT Next Visit Plan  spinal mobilizations, manual work thoracic spine and Lt upper quarter including cervical spine .      Consulted and Agree with Plan of Care  Patient       Patient will benefit from skilled therapeutic intervention in order to improve the following deficits and impairments:  Postural dysfunction, Improper body mechanics, Pain, Increased fascial restricitons, Increased muscle spasms, Decreased range of motion, Decreased mobility, Decreased strength, Decreased activity tolerance  Visit Diagnosis: Acute pain of left shoulder  Other symptoms and signs involving the musculoskeletal system  Abnormal posture  Pain in left elbow  Pain in right elbow  Pain in thoracic spine     Problem List Patient Active Problem List   Diagnosis Date Noted  . Rotator cuff tendonitis, left 12/14/2016  . Medial epicondylitis 11/02/2016  . Hallux rigidus of right foot 11/02/2016  . Bunionette of right foot 11/02/2016  . Urinary stream splitting 03/20/2016  . Osteoporosis 11/17/2015  . Postmenopausal atrophic vaginitis 02/01/2015  . Vulvar lesion 02/01/2015  . Primary osteoarthritis of left knee 06/26/2014  . Impingement syndrome of right shoulder 03/11/2013  . 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 Pain at left costal margin. 11/15/2011  . ADHD (attention deficit hyperactivity disorder) 12/05/2010  . GENERALIZED ANXIETY DISORDER 02/16/2010  . PAP SMEAR, LGSIL, ABNORMAL 02/16/2010  . DIVERTICULITIS, HX OF 01/22/2010  . ALLERGIC RHINITIS 11/16/2009  . COLONIC POLYPS, HYPERPLASTIC 01/27/2009  .  DEPRESSION 02/01/2008    Toryn Dewalt Nilda Simmer PT, MPH  01/26/2017, 2:03 PM  [facility] [place], [place], [address] Phone: [phone]  Fax:  [phone]  Name: Shirley Moody MRN: [id number] Date of Birth: 17-Dec-1960

## 2017-01-29 ENCOUNTER — Ambulatory Visit (INDEPENDENT_AMBULATORY_CARE_PROVIDER_SITE_OTHER): Payer: BC Managed Care – PPO

## 2017-01-29 DIAGNOSIS — M25612 Stiffness of left shoulder, not elsewhere classified: Secondary | ICD-10-CM

## 2017-01-29 DIAGNOSIS — M25512 Pain in left shoulder: Secondary | ICD-10-CM

## 2017-01-29 DIAGNOSIS — M7582 Other shoulder lesions, left shoulder: Secondary | ICD-10-CM

## 2017-01-31 ENCOUNTER — Encounter: Payer: Self-pay | Admitting: Rehabilitative and Restorative Service Providers"

## 2017-01-31 ENCOUNTER — Ambulatory Visit: Payer: BC Managed Care – PPO | Admitting: Rehabilitative and Restorative Service Providers"

## 2017-01-31 DIAGNOSIS — R293 Abnormal posture: Secondary | ICD-10-CM | POA: Diagnosis not present

## 2017-01-31 DIAGNOSIS — M25512 Pain in left shoulder: Secondary | ICD-10-CM

## 2017-01-31 DIAGNOSIS — R29898 Other symptoms and signs involving the musculoskeletal system: Secondary | ICD-10-CM

## 2017-01-31 NOTE — Therapy (Signed)
[place], [place], [address] Phone: [phone]   Fax:  [phone]  Physical Therapy Treatment  Patient Details  Name: Etta Gassett MRN: [id number] Date of Birth: [birth year] Referring Provider: Dr Georgina Snell    Encounter Date: 01/31/2017  PT End of Session - 01/31/17 1426    Visit Number  10    Number of Visits  12    Date for PT Re-Evaluation  02/07/17    PT Start Time  1426    PT Stop Time  1520    PT Time Calculation (min)  54 min    Activity Tolerance  Patient tolerated treatment well       Past Medical History:  Diagnosis Date  . Allergy   . Colon polyp   . Depression   . Diverticulitis   . IBS (irritable bowel syndrome)   . Osteoporosis 11/17/2015   DEXA 2017    Past Surgical History:  Procedure Laterality Date  . DILATION AND CURETTAGE OF UTERUS    . NEUROMA SURGERY     Right foot    There were no vitals filed for this visit.  Subjective Assessment - 01/31/17 1428    Subjective  Shoulder is doing "pretty good" today. She had "shooting pain" Lt forearm into the arm all day yesterday. She took OTC meds and it helped some. She had it until she went to bed last night but has had no shooting pain today. Upon discussion with patient found that she sits in rounded posture for reading and she loves to read - sits in this position a lot.     Currently in Pain?  Yes    Pain Score  1     Pain Location  Shoulder    Pain Orientation  Left    Pain Descriptors / Indicators  Aching;Sore    Pain Type  Acute pain    Pain Onset  More than a month ago    Pain Frequency  Intermittent         [facility] PT Assessment - 01/31/17 0001      Assessment   Medical Diagnosis  Lt rotator cuff tendonitis; Lt medial epicondylitis    Referring Provider  Dr Georgina Snell     Onset Date/Surgical Date  11/04/16    Hand Dominance  Right    Next MD Visit  01/22/17    Prior Therapy  yes for thoracic pain      Palpation   Spinal mobility  hypomobile and tender cervical and thoracic spine, greatest T4/5/6 and C5/6; through the Lt clavicle  assessed by Jeral Pinch, PT    Palpation comment  tightness through the Lt clavicle and 1st rib; elevated Lt 1st rib in supine. tightness through the scaleni Lt > Rt                   [facility] Adult PT Treatment/Exercise - 01/31/17 0001      Self-Care   Self-Care  -- education for sitting position for reading to avoid rounding      Shoulder Exercises: ROM/Strengthening   UBE (Upper Arm Bike)  L2 x 4 min alt fwd/back       Shoulder Exercises: Stretch   Other Shoulder Stretches  low and mid level doorway stretch x 30 sec x 3 repsadded higher level gently to patient tolerance       Moist Heat Therapy   Number Minutes Moist Heat  20 Minutes    Moist Heat  Location  Shoulder;Cervical thoracic and anterior chest wall       Electrical Stimulation   Electrical Stimulation Location  Lt ant shd; Lt ant chest; Lt lateral cervical; Lt mid thoracic     Electrical Stimulation Action  IFC    Electrical Stimulation Parameters  to tolerance    Electrical Stimulation Goals  Pain;Tone      Ultrasound   Ultrasound Location  Lt/Rt mid thoracic and cervical paraspinals     Ultrasound Parameters  1.2 to 1.5 w/cm2; 1 mHz; 100%; 8 min total     Ultrasound Goals  Pain;Other (Comment) muscular tightness       Manual Therapy   Manual therapy comments  pt prone and supine     Joint Mobilization  prone mid thoracic CPA mobs - prone and supine Cervical and upper thoracic mobs Grade II/III CPA and unilateral mobs; mobilization of clavicle Lt; mobs to 1st rib and more general mobs through Lt rib cage lateral mobs cervical spine to increase segmental mobility    Soft tissue mobilization  deep tissue work bilat cervical; upper traps; pecs; anterior cervical musculature through the scaleni     Myofascial Release  suboccipital release, platysma, scalenes, upper trap, pecs, thoracic  spine pt in prone and supine      Passive ROM  cervical flexion       Neck Exercises: Stretches   Other Neck Stretches  Lt and Rt scalene stretch x 15 sec x 3 reps             PT Education - 01/31/17 1517    Education provided  Yes    Education Details  modification of position for sitting for reading     Person(s) Educated  Patient    Methods  Explanation    Comprehension  Verbalized understanding          PT Long Term Goals - 01/31/17 1427      PT LONG TERM GOAL #1   Title  I with advanced HEP 02/07/17    Time  6    Period  Weeks    Status  On-going      PT LONG TERM GOAL #2   Title  Improve posture and alignment with patient to demonstrate improved upright posture with engagement of posterior shoudler girdle musculature 02/07/17    Time  6    Period  Weeks    Status  On-going      PT LONG TERM GOAL #3   Title  patient reports 50-75% resloution of Lt shoulder girdle pain and bilat elbow pain 02/07/17    Time  6    Period  Weeks    Status  Partially Met      PT LONG TERM GOAL #4   Title  Increase AROM Lt shoulder to equal AROM Rt shoulder 02/07/17    Time  6    Period  Weeks    Status  On-going      PT LONG TERM GOAL #5   Title  Improve FOTO to </= 33% limitation 02/07/17    Time  6    Period  Weeks    Status  On-going            Plan - 01/31/17 1525    Clinical Impression Statement  Continued up and down course with symptoms. Patient reports sitting for prolonged periods of time reading in forward flexed position. She was educated in modifications for sitting position. Worked more through the cervical and thoracic  spine with mobs and deep tissue release work through cervical musculature including paraspinals and scaleni. Will assess response to changes in posture and increased manual work.     PT Frequency  2x / week    PT Duration  6 weeks    PT Treatment/Interventions  Patient/family education;ADLs/Self Care Home Management;Cryotherapy;Electrical  Stimulation;Iontophoresis 50m/ml Dexamethasone;Moist Heat;Ultrasound;Dry needling;Manual techniques;Therapeutic activities;Therapeutic exercise;Neuromuscular re-education    PT Next Visit Plan  spinal mobilizations, manual work thoracic spine and Lt upper quarter including cervical spine - assess response to increased manual work including more aggressive mobs and deep tissue work thoracic and cervical spine     Consulted and Agree with Plan of Care  Patient       Patient will benefit from skilled therapeutic intervention in order to improve the following deficits and impairments:  Postural dysfunction, Improper body mechanics, Pain, Increased fascial restricitons, Increased muscle spasms, Decreased range of motion, Decreased mobility, Decreased strength, Decreased activity tolerance  Visit Diagnosis: Acute pain of left shoulder  Other symptoms and signs involving the musculoskeletal system  Abnormal posture     Problem List Patient Active Problem List   Diagnosis Date Noted  . Rotator cuff tendonitis, left 12/14/2016  . Medial epicondylitis 11/02/2016  . Hallux rigidus of right foot 11/02/2016  . Bunionette of right foot 11/02/2016  . Urinary stream splitting 03/20/2016  . Osteoporosis 11/17/2015  . Postmenopausal atrophic vaginitis 02/01/2015  . Vulvar lesion 02/01/2015  . Primary osteoarthritis of left knee 06/26/2014  . Impingement syndrome of right shoulder 03/11/2013  . 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 Pain at left costal margin. 11/15/2011  . ADHD (attention deficit hyperactivity disorder) 12/05/2010  . GENERALIZED ANXIETY DISORDER 02/16/2010  . PAP SMEAR, LGSIL, ABNORMAL 02/16/2010  . DIVERTICULITIS, HX OF 01/22/2010  . ALLERGIC RHINITIS 11/16/2009  . COLONIC POLYPS, HYPERPLASTIC 01/27/2009  . DEPRESSION 02/01/2008    Cherity Blickenstaff PNilda SimmerPT, MPH  01/31/2017, 3:31 PM  [facility]  [place] [address]Phone: [phone]  Fax:  [phone] Name: VFerris TallyMRN: [id number] of Birth: [birth year]

## 2017-02-02 ENCOUNTER — Encounter: Payer: Self-pay | Admitting: Rehabilitative and Restorative Service Providers"

## 2017-02-07 ENCOUNTER — Encounter: Payer: Self-pay | Admitting: Rehabilitative and Restorative Service Providers"

## 2017-02-07 ENCOUNTER — Ambulatory Visit: Payer: BC Managed Care – PPO | Admitting: Rehabilitative and Restorative Service Providers"

## 2017-02-07 DIAGNOSIS — M25512 Pain in left shoulder: Secondary | ICD-10-CM | POA: Diagnosis not present

## 2017-02-07 DIAGNOSIS — R293 Abnormal posture: Secondary | ICD-10-CM

## 2017-02-07 DIAGNOSIS — M25522 Pain in left elbow: Secondary | ICD-10-CM

## 2017-02-07 DIAGNOSIS — R29898 Other symptoms and signs involving the musculoskeletal system: Secondary | ICD-10-CM

## 2017-02-07 NOTE — Therapy (Addendum)
[place], [place], [address] Phone: [phone]   Fax:  [phone]  Physical Therapy Treatment  Patient Details  Name: Shirley Moody MRN: [id number] Date of Birth: May 13, 1960 Referring Provider: Dr Georgina Snell    Encounter Date: 02/07/2017  PT End of Session - 02/07/17 1521    Visit Number  10    Number of Visits  12    Date for PT Re-Evaluation  02/07/17    PT Start Time  8832    PT Stop Time  1612    PT Time Calculation (min)  55 min    Activity Tolerance  Patient tolerated treatment well       Past Medical History:  Diagnosis Date  . Allergy   . Colon polyp   . Depression   . Diverticulitis   . IBS (irritable bowel syndrome)   . Osteoporosis 11/17/2015   DEXA 2017    Past Surgical History:  Procedure Laterality Date  . DILATION AND CURETTAGE OF UTERUS    . NEUROMA SURGERY     Right foot    There were no vitals filed for this visit.  Subjective Assessment - 02/07/17 1523    Subjective  Shoulder is doing well today. Good days and bad days. Better if she is less active. Increased activity createes increased activity.     Currently in Pain?  Yes    Pain Score  1     Pain Location  Shoulder    Pain Orientation  Left    Pain Descriptors / Indicators  Aching;Sore    Pain Type  Acute pain    Pain Onset  More than a month ago    Pain Frequency  Intermittent         [facility] PT Assessment - 02/07/17 0001      Assessment   Medical Diagnosis  Lt rotator cuff tendonitis; Lt medial epicondylitis    Referring Provider  Dr Georgina Snell     Onset Date/Surgical Date  11/04/16    Hand Dominance  Right    Next MD Visit  01/22/17    Prior Therapy  yes for thoracic pain      Observation/Other Assessments   Focus on Therapeutic Outcomes (FOTO)   39% limitation       AROM   Right Shoulder Extension  70 Degrees    Right Shoulder Flexion  165 Degrees    Right Shoulder ABduction  175 Degrees     Right Shoulder Internal Rotation  42 Degrees    Right Shoulder External Rotation  98 Degrees    Left Shoulder Extension  48 Degrees    Left Shoulder Flexion  150 Degrees    Left Shoulder ABduction  162 Degrees    Left Shoulder Internal Rotation  30 Degrees    Left Shoulder External Rotation  93 Degrees    Cervical Flexion  54    Cervical Extension  45    Cervical - Right Side Bend  30    Cervical - Left Side Bend  30    Cervical - Right Rotation  78    Cervical - Left Rotation  70                  [facility] Adult PT Treatment/Exercise - 02/07/17 0001      Shoulder Exercises: Supine   Other Supine Exercises  axial extension/chin tuck 10 sec x 5       Shoulder Exercises: ROM/Strengthening   UBE (  Upper Arm Bike)  L2 x 4 min alt fwd/back       Shoulder Exercises: Stretch   Other Shoulder Stretches  low and mid level doorway stretch x 30 sec x 3 reps added higher level gently to patient tolerance       Moist Heat Therapy   Number Minutes Moist Heat  20 Minutes    Moist Heat Location  Shoulder;Cervical thoracic and anterior chest wall       Electrical Stimulation   Electrical Stimulation Location  Lt ant shd; Lt ant chest; Lt lateral cervical; Lt mid thoracic     Electrical Stimulation Action  IFC    Electrical Stimulation Parameters  to tolerance    Electrical Stimulation Goals  Pain;Tone      Ultrasound   Ultrasound Location  Lt anterior shoulder biceps and deltoid     Ultrasound Parameters  1.5 w/cm2; 1 mHz; 100%; 8 min     Ultrasound Goals  Pain;Other (Comment) muscular tightness       Manual Therapy   Manual therapy comments  pt supine     Soft tissue mobilization  deep tissue work bilat cervical; upper traps; pecs; anterior cervical musculature through the scaleni     Myofascial Release  suboccipital release, platysma, scalenes, upper trap, pecs, thoracic spine pt in prone and supine      Passive ROM  cervical flexion       Neck Exercises: Stretches   Other Neck  Stretches  Lt and Rt scalene stretch x 15 sec x 3 reps                  PT Long Term Goals - 02/07/17 1522      PT LONG TERM GOAL #1   Title  I with advanced HEP 02/07/17    Time  6    Period  Weeks    Status  On-going      PT LONG TERM GOAL #2   Title  Improve posture and alignment with patient to demonstrate improved upright posture with engagement of posterior shoudler girdle musculature 02/07/17    Time  6    Period  Weeks    Status  On-going      PT LONG TERM GOAL #3   Title  patient reports 50-75% resloution of Lt shoulder girdle pain and bilat elbow pain 02/07/17    Time  6    Period  Weeks    Status  Partially Met      PT LONG TERM GOAL #4   Title  Increase AROM Lt shoulder to equal AROM Rt shoulder 02/07/17    Time  6    Period  Weeks    Status  On-going      PT LONG TERM GOAL #5   Title  Improve FOTO to </= 33% limitation 02/07/17    Time  6    Period  Weeks    Status  On-going            Plan - 02/07/17 1601    Clinical Impression Statement  Some improvement but has up and down course with pain in the Lt UE. She is not having radicular Le UE. Patient has some improvement in ROM and less palpable tightness. She is making gradual progress. Muscular tightness noted in the biceps and deltoid as well as through the pecs.     Rehab Potential  Good    PT Frequency  2x / week    PT Duration  6 weeks    PT Treatment/Interventions  Patient/family education;ADLs/Self Care Home Management;Cryotherapy;Electrical Stimulation;Iontophoresis 34m/ml Dexamethasone;Moist Heat;Ultrasound;Dry needling;Manual techniques;Therapeutic activities;Therapeutic exercise;Neuromuscular re-education    PT Next Visit Plan  spinal mobilizations, manual work thoracic spine and Lt upper quarter including cervical spine - assess response to increased manual work including more aggressive mobs and deep tissue work thoracic and cervical spine possible trial of taping, ionto Lt deltoid      Consulted and Agree with Plan of Care  Patient       Patient will benefit from skilled therapeutic intervention in order to improve the following deficits and impairments:  Postural dysfunction, Improper body mechanics, Pain, Increased fascial restricitons, Increased muscle spasms, Decreased range of motion, Decreased mobility, Decreased strength, Decreased activity tolerance  Visit Diagnosis: Acute pain of left shoulder  Other symptoms and signs involving the musculoskeletal system  Abnormal posture  Pain in left elbow     Problem List Patient Active Problem List   Diagnosis Date Noted  . Rotator cuff tendonitis, left 12/14/2016  . Medial epicondylitis 11/02/2016  . Hallux rigidus of right foot 11/02/2016  . Bunionette of right foot 11/02/2016  . Urinary stream splitting 03/20/2016  . Osteoporosis 11/17/2015  . Postmenopausal atrophic vaginitis 02/01/2015  . Vulvar lesion 02/01/2015  . Primary osteoarthritis of left knee 06/26/2014  . Impingement syndrome of right shoulder 03/11/2013  . 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 Pain at left costal margin. 11/15/2011  . ADHD (attention deficit hyperactivity disorder) 12/05/2010  . GENERALIZED ANXIETY DISORDER 02/16/2010  . PAP SMEAR, LGSIL, ABNORMAL 02/16/2010  . DIVERTICULITIS, HX OF 01/22/2010  . ALLERGIC RHINITIS 11/16/2009  . COLONIC POLYPS, HYPERPLASTIC 01/27/2009  . DEPRESSION 02/01/2008    Breland Trouten PNilda SimmerPT, MPH  02/07/2017, 5:03 PM  [facility] [address]Phone: [phone]  Fax:  [phone] Name: Shirley ObarrMRN: [id number] of Birth: 301-10-1960

## 2017-02-09 ENCOUNTER — Ambulatory Visit: Payer: BC Managed Care – PPO | Admitting: Rehabilitative and Restorative Service Providers"

## 2017-02-09 ENCOUNTER — Encounter: Payer: Self-pay | Admitting: Rehabilitative and Restorative Service Providers"

## 2017-02-09 DIAGNOSIS — R293 Abnormal posture: Secondary | ICD-10-CM | POA: Diagnosis not present

## 2017-02-09 DIAGNOSIS — R29898 Other symptoms and signs involving the musculoskeletal system: Secondary | ICD-10-CM

## 2017-02-09 DIAGNOSIS — M25512 Pain in left shoulder: Secondary | ICD-10-CM

## 2017-02-09 NOTE — Therapy (Signed)
[place], [place], [address] Phone: [phone]   Fax:  [phone]  Physical Therapy Treatment  Patient Details  Name: Shirley Moody MRN: [id number] Date of Birth: Feb 12, 1960 Referring Provider: Dr Georgina Snell    Encounter Date: 02/09/2017  PT End of Session - 02/09/17 1151    Visit Number  11    Number of Visits  24    Date for PT Re-Evaluation  03/23/17    PT Start Time  1147    PT Stop Time  1254    PT Time Calculation (min)  67 min    Activity Tolerance  Patient tolerated treatment well       Past Medical History:  Diagnosis Date  . Allergy   . Colon polyp   . Depression   . Diverticulitis   . IBS (irritable bowel syndrome)   . Osteoporosis 11/17/2015   DEXA 2017    Past Surgical History:  Procedure Laterality Date  . DILATION AND CURETTAGE OF UTERUS    . NEUROMA SURGERY     Right foot    There were no vitals filed for this visit.  Subjective Assessment - 02/09/17 1152    Subjective  Some better today but yesterday was a bad day. She had intense pain in the Lt neck and shoulder radiating into the mid to lower back lasting for most of the day. She went to bed on the heating pad and awoke with some improvement today. Increasede activity increases pain. RTD 02/19/17    Currently in Pain?  Yes    Pain Score  2     Pain Location  Shoulder    Pain Orientation  Left    Pain Descriptors / Indicators  Aching;Sore    Pain Type  Acute pain;Chronic pain                      [facility] Adult PT Treatment/Exercise - 02/09/17 0001      Shoulder Exercises: Supine   Other Supine Exercises  axial extension/chin tuck 10 sec x 5       Shoulder Exercises: Standing   Extension  Strengthening;Both;10 reps;Theraband    Theraband Level (Shoulder Extension)  Level 3 (Green)    Row  Strengthening;Both;10 reps;Theraband    Theraband Level (Shoulder Row)  Level 3 (Green)    Other Standing  Exercises  ; isometric scapular retraction yellow TB 10 5 sec x 10 with noodle     Other Standing Exercises   With noodle:  W's x 15 reps; L's x 15 reps       Shoulder Exercises: ROM/Strengthening   UBE (Upper Arm Bike)  --      Shoulder Exercises: Stretch   Other Shoulder Stretches  low and mid level doorway stretch x 30 sec x 3 reps added higher level gently to patient tolerance       Moist Heat Therapy   Number Minutes Moist Heat  20 Minutes    Moist Heat Location  Shoulder;Cervical thoracic and anterior chest wall       Electrical Stimulation   Electrical Stimulation Location  Lt ant shd; Lt ant chest; Lt lateral cervical; Lt mid thoracic     Electrical Stimulation Action  IFC    Electrical Stimulation Parameters  to tolerance    Electrical Stimulation Goals  Pain;Tone      Ultrasound   Ultrasound Location  Lt upper trap; leveator; medial scapular area  Ultrasound Parameters  1.5 w/cm2; 1 mHz; 100%; 8 min     Ultrasound Goals  Pain;Other (Comment) muscular tightness       Manual Therapy   Manual therapy comments  patient sidelying; prone and supine for manual work     Joint Mobilization  prone mid thoracic to cervical spine CPA and lateral mobs - prone and supine Cervical and upper thoracic Grade II/III; mobilization of clavicle Lt; mobs to 1st rib and more general mobs through Lt rib cage    Soft tissue mobilization  deep tissue work bilat cervical; upper traps; leveator; teres; pecs; anterior cervical musculature through the scaleni     Myofascial Release  suboccipital release, platysma, scalenes, upper trap, pecs, thoracic spine pt in prone and supine      Manual Traction  manual cervical traction 20-30 sec hold x 4 reps                   PT Long Term Goals - 02/09/17 1322      PT LONG TERM GOAL #1   Title  I with advanced HEP 03/23/17    Time  12    Period  Weeks    Status  Revised      PT LONG TERM GOAL #2   Title  Improve posture and alignment with  patient to demonstrate improved upright posture with engagement of posterior shoudler girdle musculature 03/23/17    Time  12    Period  Weeks    Status  Revised      PT LONG TERM GOAL #3   Title  patient reports 50-75% resloution of Lt shoulder girdle pain and bilat elbow pain 03/23/17    Time  12    Period  Weeks    Status  Revised      PT LONG TERM GOAL #4   Title  Increase AROM Lt shoulder to equal AROM Rt shoulder 03/23/17    Time  12    Period  Weeks    Status  Revised      PT LONG TERM GOAL #5   Title  Improve FOTO to </= 33% limitation 03/23/17    Time  12    Period  Weeks    Status  Revised            Plan - 02/09/17 1253    Clinical Impression Statement  Patient demonstrates improved moblity through the clavicle and 1st rib as well as with cervical and thoracic mobs and PROM Lt shoulder. She has greatest tightness and discomfort with CPA mobs through mid thoracic and lower cervical spine. She has continued muscular tightness in Lt upper quarter with increased tightness in upper trap, leveator, pecs, teres. Muscular tightness responded well to [place], manual work and stretching. Pain is inconsistent and flares without apparent cause, variable day to day of hour to hour. Patient has elevated lower ribs Lt compared to Rt in supine; tenderness to palpation thorugh anterior ribs. Patient did walk for ~ 45 min yesterday with only some tightness in the LB. Plans to return to exercise class next week. To avoid overhead activities. Some evidence of thoracic outlet involvement. Will continue treatment pending RTD 02/19/17.    Rehab Potential  Good    PT Frequency  2x / week    PT Duration  6 weeks    PT Treatment/Interventions  Patient/family education;ADLs/Self Care Home Management;Cryotherapy;Electrical Stimulation;Iontophoresis 4mg /ml Dexamethasone;Moist Heat;Ultrasound;Dry needling;Manual techniques;Therapeutic activities;Therapeutic exercise;Neuromuscular re-education    PT Next Visit Plan  spinal mobilizations, manual work thoracic spine and Lt upper quarter including cervical spine - assess response to increased manual work including more aggressive mobs and deep tissue work thoracic and cervical spine possible trial of taping, ionto Lt deltoid     Consulted and Agree with Plan of Care  Patient       Patient will benefit from skilled therapeutic intervention in order to improve the following deficits and impairments:  Postural dysfunction, Improper body mechanics, Pain, Increased fascial restricitons, Increased muscle spasms, Decreased range of motion, Decreased mobility, Decreased strength, Decreased activity tolerance  Visit Diagnosis: Acute pain of left shoulder - Plan: PT plan of care cert/re-cert  Other symptoms and signs involving the musculoskeletal system - Plan: PT plan of care cert/re-cert  Abnormal posture - Plan: PT plan of care cert/re-cert     Problem List Patient Active Problem List   Diagnosis Date Noted  . Rotator cuff tendonitis, left 12/14/2016  . Medial epicondylitis 11/02/2016  . Hallux rigidus of right foot 11/02/2016  . Bunionette of right foot 11/02/2016  . Urinary stream splitting 03/20/2016  . Osteoporosis 11/17/2015  . Postmenopausal atrophic vaginitis 02/01/2015  . Vulvar lesion 02/01/2015  . Primary osteoarthritis of left knee 06/26/2014  . Impingement syndrome of right shoulder 03/11/2013  . 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 Pain at left costal margin. 11/15/2011  . ADHD (attention deficit hyperactivity disorder) 12/05/2010  . GENERALIZED ANXIETY DISORDER 02/16/2010  . PAP SMEAR, LGSIL, ABNORMAL 02/16/2010  . DIVERTICULITIS, HX OF 01/22/2010  . ALLERGIC RHINITIS 11/16/2009  . COLONIC POLYPS, HYPERPLASTIC 01/27/2009  . DEPRESSION 02/01/2008    Onur Mori Nilda Simmer PT, MPH  02/09/2017, 1:24 PM  [facility] [place], [place], [address] Phone: [phone]   Fax:   [phone]  Name: Shirley Moody MRN: [id number] Date of Birth: [birth year]

## 2017-02-14 ENCOUNTER — Ambulatory Visit: Payer: BC Managed Care – PPO | Admitting: Physical Therapy

## 2017-02-14 DIAGNOSIS — M25512 Pain in left shoulder: Secondary | ICD-10-CM

## 2017-02-14 DIAGNOSIS — R29898 Other symptoms and signs involving the musculoskeletal system: Secondary | ICD-10-CM | POA: Diagnosis not present

## 2017-02-14 DIAGNOSIS — R293 Abnormal posture: Secondary | ICD-10-CM

## 2017-02-14 DIAGNOSIS — M25522 Pain in left elbow: Secondary | ICD-10-CM

## 2017-02-14 NOTE — Therapy (Signed)
[place], [place], [address] Phone: [phone]   Fax:  [phone]  Physical Therapy Treatment  Patient Details  Name: Shirley Moody MRN: [id number] Date of Birth: 1960-10-12 Referring Provider: Dr Georgina Snell    Encounter Date: 02/14/2017  PT End of Session - 02/14/17 0933    Visit Number  12    Number of Visits  24    Date for PT Re-Evaluation  03/23/17    PT Start Time  0933    PT Stop Time  0174    PT Time Calculation (min)  59 min       Past Medical History:  Diagnosis Date  . Allergy   . Colon polyp   . Depression   . Diverticulitis   . IBS (irritable bowel syndrome)   . Osteoporosis 11/17/2015   DEXA 2017    Past Surgical History:  Procedure Laterality Date  . DILATION AND CURETTAGE OF UTERUS    . NEUROMA SURGERY     Right foot    There were no vitals filed for this visit.  Subjective Assessment - 02/14/17 0933    Subjective  Pt reports she is doing better, hasn't needed the heat much this week.     Currently in Pain?  Yes    Pain Score  2     Pain Location  Shoulder    Pain Orientation  Left    Pain Descriptors / Indicators  Dull;Sore    Pain Type  Chronic pain    Pain Onset  More than a month ago    Pain Frequency  Intermittent    Aggravating Factors   picking up something heavier - gallon of milk, reaching behind     Pain Relieving Factors  heat                      [facility] Adult PT Treatment/Exercise - 02/14/17 0001      Shoulder Exercises: Supine   Other Supine Exercises  over black bolster knees bent then straight, arms overhead , then over pressure by therapist      Shoulder Exercises: Stretch   Other Shoulder Stretches  chest and sides with strap in door.       Modalities   Modalities  Electrical Stimulation;Moist Heat      Moist Heat Therapy   Number Minutes Moist Heat  20 Minutes    Moist Heat Location  Shoulder;Cervical      Electrical  Stimulation   Electrical Stimulation Location  Lt ant shd; Lt ant chest; Lt lateral cervical; Lt mid thoracic     Electrical Stimulation Action  IFC     Electrical Stimulation Parameters  to tolerance    Electrical Stimulation Goals  Pain;Tone      Manual Therapy   Manual Therapy  Joint mobilization    Manual therapy comments  supine/prone    Joint Mobilization  supine AP mobs C3/5/7 then prone CPA/bilat UPA mobs grade IIII, Lt first rib mobs in sitting wth arm abducted to 90 degrees.     Soft tissue mobilization  STM Lt scalenes and SCM.                  PT Long Term Goals - 02/09/17 1322      PT LONG TERM GOAL #1   Title  I with advanced HEP 03/23/17    Time  12    Period  Weeks    Status  Revised      PT LONG TERM GOAL #2   Title  Improve posture and alignment with patient to demonstrate improved upright posture with engagement of posterior shoudler girdle musculature 03/23/17    Time  12    Period  Weeks    Status  Revised      PT LONG TERM GOAL #3   Title  patient reports 50-75% resloution of Lt shoulder girdle pain and bilat elbow pain 03/23/17    Time  12    Period  Weeks    Status  Revised      PT LONG TERM GOAL #4   Title  Increase AROM Lt shoulder to equal AROM Rt shoulder 03/23/17    Time  12    Period  Weeks    Status  Revised      PT LONG TERM GOAL #5   Title  Improve FOTO to </= 33% limitation 03/23/17    Time  12    Period  Weeks    Status  Revised            Plan - 02/14/17 1351    Clinical Impression Statement  Shirley Moody reports some improvement this past week.  She responded well to Lt first rib mobs and anterior cervical mobs, Continues to be stiff in the thoracic and cervical spine. She plans on monitoring her progress over the next couple of days.  If she has lasting improvement we will continue with treatment however if not refer back to MD    Rehab Potential  Good    PT Frequency  2x / week    PT Duration  6 weeks    PT  Treatment/Interventions  Patient/family education;ADLs/Self Care Home Management;Cryotherapy;Electrical Stimulation;Iontophoresis 4mg /ml Dexamethasone;Moist Heat;Ultrasound;Dry needling;Manual techniques;Therapeutic activities;Therapeutic exercise;Neuromuscular re-education    PT Next Visit Plan  assess response to mobilizations, if no improvement, discharge and refer back to MD    Consulted and Agree with Plan of Care  Patient       Patient will benefit from skilled therapeutic intervention in order to improve the following deficits and impairments:  Postural dysfunction, Improper body mechanics, Pain, Increased fascial restricitons, Increased muscle spasms, Decreased range of motion, Decreased mobility, Decreased strength, Decreased activity tolerance  Visit Diagnosis: Acute pain of left shoulder  Other symptoms and signs involving the musculoskeletal system  Abnormal posture  Pain in left elbow     Problem List Patient Active Problem List   Diagnosis Date Noted  . Rotator cuff tendonitis, left 12/14/2016  . Medial epicondylitis 11/02/2016  . Hallux rigidus of right foot 11/02/2016  . Bunionette of right foot 11/02/2016  . Urinary stream splitting 03/20/2016  . Osteoporosis 11/17/2015  . Postmenopausal atrophic vaginitis 02/01/2015  . Vulvar lesion 02/01/2015  . Primary osteoarthritis of left knee 06/26/2014  . Impingement syndrome of right shoulder 03/11/2013  . 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 Pain at left costal margin. 11/15/2011  . ADHD (attention deficit hyperactivity disorder) 12/05/2010  . GENERALIZED ANXIETY DISORDER 02/16/2010  . PAP SMEAR, LGSIL, ABNORMAL 02/16/2010  . DIVERTICULITIS, HX OF 01/22/2010  . ALLERGIC RHINITIS 11/16/2009  . COLONIC POLYPS, HYPERPLASTIC 01/27/2009  . DEPRESSION 02/01/2008    Shirley Moody PT  02/14/2017, 1:55 PM  [facility] [place], [place],  [address] Phone: [phone]   Fax:  [phone]  Name: Shirley Moody MRN: [id number] Date of Birth: 27-Mar-1960

## 2017-02-16 ENCOUNTER — Encounter: Payer: Self-pay | Admitting: Physical Therapy

## 2017-02-16 ENCOUNTER — Ambulatory Visit (INDEPENDENT_AMBULATORY_CARE_PROVIDER_SITE_OTHER): Payer: BC Managed Care – PPO | Admitting: Physical Therapy

## 2017-02-16 DIAGNOSIS — M25522 Pain in left elbow: Secondary | ICD-10-CM

## 2017-02-16 DIAGNOSIS — M25512 Pain in left shoulder: Secondary | ICD-10-CM | POA: Diagnosis not present

## 2017-02-16 DIAGNOSIS — R29898 Other symptoms and signs involving the musculoskeletal system: Secondary | ICD-10-CM

## 2017-02-16 DIAGNOSIS — R293 Abnormal posture: Secondary | ICD-10-CM | POA: Diagnosis not present

## 2017-02-16 NOTE — Therapy (Signed)
[place], [place], [address] Phone: [phone]   Fax:  [phone]  Physical Therapy Treatment  Patient Details  Name: Shirley Moody MRN: [id number] Date of Birth: Feb 07, 1960 Referring Provider: Dr Georgina Snell    Encounter Date: 02/16/2017  PT End of Session - 02/16/17 0929    Visit Number  13    Number of Visits  24    Date for PT Re-Evaluation  03/23/17    PT Start Time  0931    PT Stop Time  1032    PT Time Calculation (min)  61 min    Activity Tolerance  Patient tolerated treatment well       Past Medical History:  Diagnosis Date  . Allergy   . Colon polyp   . Depression   . Diverticulitis   . IBS (irritable bowel syndrome)   . Osteoporosis 11/17/2015   DEXA 2017    Past Surgical History:  Procedure Laterality Date  . DILATION AND CURETTAGE OF UTERUS    . NEUROMA SURGERY     Right foot    There were no vitals filed for this visit.  Subjective Assessment - 02/16/17 0931    Subjective  Shirley Moody feels like the last couple of treatments have helped. She is not needing to use a heating pad nearly as much.  In the AM the low back is not as stiff, less intense pain in the Lt UE with overhead motion.     Currently in Pain?  No/denies                      [facility] Adult PT Treatment/Exercise - 02/16/17 0001      Self-Care   Self-Care  Other Self-Care Comments      Shoulder Exercises: Supine   Other Supine Exercises  over black bolster knees bent then straight, arms overhead , then over pressure by therapist the over yoga eggs      Modalities   Modalities  Electrical Stimulation;Moist Heat      Moist Heat Therapy   Number Minutes Moist Heat  20 Minutes    Moist Heat Location  Shoulder;Cervical      Electrical Stimulation   Electrical Stimulation Location  Lt ant shd; Lt ant chest; Lt lateral cervical; Lt mid thoracic     Electrical Stimulation Action  IFC    Electrical Stimulation Parameters   to tolerance    Electrical Stimulation Goals  Pain;Tone      Manual Therapy   Manual Therapy  Joint mobilization    Manual therapy comments  supine/prone    Joint Mobilization  supine AP mobs C3/5/7 then prone CPA/bilat UPA mobs grade IIII,     Soft tissue mobilization  STM Lt scalenes and SCM. and upper trap                  PT Long Term Goals - 02/09/17 1322      PT LONG TERM GOAL #1   Title  I with advanced HEP 03/23/17    Time  12    Period  Weeks    Status  Revised      PT LONG TERM GOAL #2   Title  Improve posture and alignment with patient to demonstrate improved upright posture with engagement of posterior shoudler girdle musculature 03/23/17    Time  12    Period  Weeks    Status  Revised      PT  LONG TERM GOAL #3   Title  patient reports 50-75% resloution of Lt shoulder girdle pain and bilat elbow pain 03/23/17    Time  12    Period  Weeks    Status  Revised      PT LONG TERM GOAL #4   Title  Increase AROM Lt shoulder to equal AROM Rt shoulder 03/23/17    Time  12    Period  Weeks    Status  Revised      PT LONG TERM GOAL #5   Title  Improve FOTO to </= 33% limitation 03/23/17    Time  12    Period  Weeks    Status  Revised            Plan - 02/16/17 1011    Clinical Impression Statement  Shirley Moody has made gains this past week and feels like this new focus on mobilizations of the c-spine and first rib are helping, along with improving mobility throughout the thoracic spine    Rehab Potential  Good    PT Frequency  2x / week    PT Duration  6 weeks    PT Treatment/Interventions  Patient/family education;ADLs/Self Care Home Management;Cryotherapy;Electrical Stimulation;Iontophoresis 4mg /ml Dexamethasone;Moist Heat;Ultrasound;Dry needling;Manual techniques;Therapeutic activities;Therapeutic exercise;Neuromuscular re-education    PT Next Visit Plan  assess response to mobilizations, and continue       Patient will  benefit from skilled therapeutic intervention in order to improve the following deficits and impairments:  Postural dysfunction, Improper body mechanics, Pain, Increased fascial restricitons, Increased muscle spasms, Decreased range of motion, Decreased mobility, Decreased strength, Decreased activity tolerance  Visit Diagnosis: Acute pain of left shoulder  Other symptoms and signs involving the musculoskeletal system  Abnormal posture  Pain in left elbow     Problem List Patient Active Problem List   Diagnosis Date Noted  . Rotator cuff tendonitis, left 12/14/2016  . Medial epicondylitis 11/02/2016  . Hallux rigidus of right foot 11/02/2016  . Bunionette of right foot 11/02/2016  . Urinary stream splitting 03/20/2016  . Osteoporosis 11/17/2015  . Postmenopausal atrophic vaginitis 02/01/2015  . Vulvar lesion 02/01/2015  . Primary osteoarthritis of left knee 06/26/2014  . Impingement syndrome of right shoulder 03/11/2013  . 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 Pain at left costal margin. 11/15/2011  . ADHD (attention deficit hyperactivity disorder) 12/05/2010  . GENERALIZED ANXIETY DISORDER 02/16/2010  . PAP SMEAR, LGSIL, ABNORMAL 02/16/2010  . DIVERTICULITIS, HX OF 01/22/2010  . ALLERGIC RHINITIS 11/16/2009  . COLONIC POLYPS, HYPERPLASTIC 01/27/2009  . DEPRESSION 02/01/2008    Jeral Pinch PT 02/16/2017, 10:20 AM  [facility] [place] [facility] [place], [place], [address] Phone: [phone]   Fax:  [phone]  Name: Shirley Moody MRN: [id number] Date of Birth: 1960-06-27

## 2017-02-19 ENCOUNTER — Encounter: Payer: Self-pay | Admitting: Family Medicine

## 2017-02-19 ENCOUNTER — Encounter: Payer: Self-pay | Admitting: Physical Therapy

## 2017-02-19 ENCOUNTER — Ambulatory Visit: Payer: BC Managed Care – PPO | Admitting: Family Medicine

## 2017-02-19 ENCOUNTER — Ambulatory Visit: Payer: BC Managed Care – PPO | Admitting: Physical Therapy

## 2017-02-19 VITALS — BP 134/77 | HR 63 | Ht 63.0 in | Wt 128.0 lb

## 2017-02-19 DIAGNOSIS — M25522 Pain in left elbow: Secondary | ICD-10-CM

## 2017-02-19 DIAGNOSIS — M7582 Other shoulder lesions, left shoulder: Secondary | ICD-10-CM | POA: Diagnosis not present

## 2017-02-19 DIAGNOSIS — M542 Cervicalgia: Secondary | ICD-10-CM

## 2017-02-19 DIAGNOSIS — R293 Abnormal posture: Secondary | ICD-10-CM

## 2017-02-19 DIAGNOSIS — M25512 Pain in left shoulder: Secondary | ICD-10-CM

## 2017-02-19 DIAGNOSIS — R29898 Other symptoms and signs involving the musculoskeletal system: Secondary | ICD-10-CM | POA: Diagnosis not present

## 2017-02-19 MED ORDER — METHOCARBAMOL 500 MG PO TABS: 500.0000 mg | ORAL_TABLET | Freq: Three times a day (TID) | ORAL | 0 refills | Status: DC | PRN

## 2017-02-19 NOTE — Therapy (Addendum)
[facility] [place], [place], [address] Phone: [phone]   Fax:  [phone]  Physical Therapy Treatment  Patient Details  Name: Shirley Moody MRN: [id number] Date of Birth: 14-Jul-1960 Referring Provider: Dr Georgina Snell    Encounter Date: 02/19/2017  PT End of Session - 02/19/17 0751    Visit Number  14    Number of Visits  24    Date for PT Re-Evaluation  03/23/17    PT Start Time  0751    PT Stop Time  0900    PT Time Calculation (min)  69 min    Activity Tolerance  Patient tolerated treatment well       Past Medical History:  Diagnosis Date  . Allergy   . Colon polyp   . Depression   . Diverticulitis   . IBS (irritable bowel syndrome)   . Osteoporosis 11/17/2015   DEXA 2017    Past Surgical History:  Procedure Laterality Date  . DILATION AND CURETTAGE OF UTERUS    . NEUROMA SURGERY     Right foot    There were no vitals filed for this visit.  Subjective Assessment - 02/19/17 0754    Subjective  Shirley Moody reports no change over the weekend in the Lt UE however is now having symptoms in the Rt UE in the axilla    Pertinent History  prior LBP; osteoporosis; Rt rotator cuff pain     Patient Stated Goals  get rid of pain in the Lt shoulder     Currently in Pain?  No/denies nothing at rest    Aggravating Factors   using the arm, lifting.                       [facility] Adult PT Treatment/Exercise - 02/19/17 0001      Modalities   Modalities  Electrical Stimulation;Moist Heat      Moist Heat Therapy   Number Minutes Moist Heat  15 Minutes    Moist Heat Location  Shoulder;Cervical thoracic      Electrical Stimulation   Electrical Stimulation Location  bilat cervical    Electrical Stimulation Action  IFC    Electrical Stimulation Parameters   to tolerance    Electrical Stimulation Goals  Pain;Tone      Manual Therapy   Manual Therapy  Joint mobilization;Soft tissue  mobilization;Myofascial release    Manual therapy comments  supine/prone    Joint Mobilization  supine AP mobs C3/5/7 then prone CPA/bilal mobs grade IIII, CPA mobs thoracic with focus on T3-1, this is the area of most discomfort and tightness.   first rib mobs bilat seated.     Soft tissue mobilization  bilat upper traps and pecs.  Rt tighter than left     Myofascial Release  bilat arm pull       Trigger Point Dry Needling - 02/19/17 0843    Consent Given?  Yes    Education Handout Provided  No    Muscles Treated Upper Body  Upper trapezius    Upper Trapezius Response  Palpable increased muscle length;Twitch reponse elicited Lt                PT Long Term Goals - 02/09/17 1322      PT LONG TERM GOAL #1   Title  I with advanced HEP 03/23/17    Time  12    Period  Weeks    Status  Revised  PT LONG TERM GOAL #2   Title  Improve posture and alignment with patient to demonstrate improved upright posture with engagement of posterior shoudler girdle musculature 03/23/17    Time  12    Period  Weeks    Status  Revised      PT LONG TERM GOAL #3   Title  patient reports 50-75% resloution of Lt shoulder girdle pain and bilat elbow pain 03/23/17    Time  12    Period  Weeks    Status  Revised      PT LONG TERM GOAL #4   Title  Increase AROM Lt shoulder to equal AROM Rt shoulder 03/23/17    Time  12    Period  Weeks    Status  Revised      PT LONG TERM GOAL #5   Title  Improve FOTO to </= 33% limitation 03/23/17    Time  12    Period  Weeks    Status  Revised            Plan - 02/19/17 1035    Clinical Impression Statement  Shirley Moody reports that her Lt UE isn't worse after the weekend, no more improvement either.  She is very nervous as she is beginning to have similair symptoms on the Rt side.  Test for thoracic out let was (-), pulse on the Lt side decreased slightly however was still palpable.  She does have muscular tightness and spinal hypomobility.  She is seeing  MD this AM and will get his advice on continuing with therapy or not. No new goals met however patient is able to lie on her left side a little better.      Rehab Potential  Good    PT Frequency  2x / week    PT Duration  6 weeks    PT Treatment/Interventions  Patient/family education;ADLs/Self Care Home Management;Cryotherapy;Electrical Stimulation;Iontophoresis 51m/ml Dexamethasone;Moist Heat;Ultrasound;Dry needling;Manual techniques;Therapeutic activities;Therapeutic exercise;Neuromuscular re-education    PT Next Visit Plan  place on hold and see what MD recommends.  - pt stopped back after her appt.  MD wishes for her to continue with therapy, feels she has RTC tendonits and possibly involvement of the cervical/thoracic spine. She is going to have a thoracic x-ray.     Consulted and Agree with Plan of Care  Patient       Patient will benefit from skilled therapeutic intervention in order to improve the following deficits and impairments:  Postural dysfunction, Improper body mechanics, Pain, Increased fascial restricitons, Increased muscle spasms, Decreased range of motion, Decreased mobility, Decreased strength, Decreased activity tolerance  Visit Diagnosis: Acute pain of left shoulder  Other symptoms and signs involving the musculoskeletal system  Abnormal posture  Pain in left elbow     Problem List Patient Active Problem List   Diagnosis Date Noted  . Rotator cuff tendonitis, left 12/14/2016  . Medial epicondylitis 11/02/2016  . Hallux rigidus of right foot 11/02/2016  . Bunionette of right foot 11/02/2016  . Urinary stream splitting 03/20/2016  . Osteoporosis 11/17/2015  . Postmenopausal atrophic vaginitis 02/01/2015  . Vulvar lesion 02/01/2015  . Primary osteoarthritis of left knee 06/26/2014  . Impingement syndrome of right shoulder 03/11/2013  . 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 Pain at left costal margin. 11/15/2011  . ADHD (attention deficit hyperactivity  disorder) 12/05/2010  . GENERALIZED ANXIETY DISORDER 02/16/2010  . PAP SMEAR, LGSIL, ABNORMAL 02/16/2010  . DIVERTICULITIS, HX OF 01/22/2010  . ALLERGIC RHINITIS 11/16/2009  .  COLONIC POLYPS, HYPERPLASTIC 01/27/2009  . DEPRESSION 02/01/2008    Jeral Pinch PT 02/19/2017, 10:39 AM  [facility] [place], [place], [address] Phone: [phone]   Fax:  [phone]  Name: Shirley Moody MRN: [id number] Date of Birth: [birth year]   PHYSICAL THERAPY DISCHARGE SUMMARY  Visits from Start of Care: 14  Current functional level related to goals / functional outcomes: unknown   Remaining deficits: Unknown, per chart it would appear she is having spinal injections    Education / Equipment: HEP Plan:                                                    Patient goals were not met. Patient is being discharged due to not returning since the last visit.  ?????     Jeral Pinch, PT 04/12/17 9:14 AM

## 2017-02-19 NOTE — Progress Notes (Signed)
Shirley Moody is a 57 y.o. female who presents to [facility] today for follow-up left shoulder pain.  Shirley Moody was seen today for left shoulder pain.  She has been seen multiple times for shoulder pain and has had physical therapy.  She notes the pain occurs in her lateral upper arm pectoralis muscle and into her neck and back.  After failing conservative management she had an MRI of her left shoulder and is here for follow-up.  MRI showed only mild rotator cuff tendinopathy.  Physical therapy is concerned she has potential cervical spine dysfunction.  In September 2018 she had an x-ray of her thoracic spine showing arthritis and mild scoliosis.  She notes she does experience some pain in her left upper arm but the most of the pain is in the neck and back.    Past Medical History:  Diagnosis Date  . Allergy   . Colon polyp   . Depression   . Diverticulitis   . IBS (irritable bowel syndrome)   . Osteoporosis 11/17/2015   DEXA 2017   Past Surgical History:  Procedure Laterality Date  . DILATION AND CURETTAGE OF UTERUS    . NEUROMA SURGERY     Right foot   Social History   Tobacco Use  . Smoking status: Former Research scientist (life sciences)  . Smokeless tobacco: Never Used  Substance Use Topics  . Alcohol use: Yes    Alcohol/week: 0.0 oz    Comment: 1 drink/month     ROS:  As above   Medications: Current Outpatient Medications  Medication Sig Dispense Refill  . alendronate (FOSAMAX) 70 MG tablet TAKE 1 TABLET(70 MG) BY MOUTH 1 TIME A WEEK WITH A FULL GLASS OF WATER AND ON AN EMPTY STOMACH 12 tablet 4  . buPROPion (WELLBUTRIN XL) 150 MG 24 hr tablet Take 450 mg by mouth daily.     . Calcium Carbonate (CALCIUM 600 PO) Take 600 mg by mouth.    . cetirizine (ZYRTEC) 10 MG tablet Take 10 mg by mouth daily.      . diclofenac sodium (VOLTAREN) 1 % GEL Apply 2 g topically 4 (four) times daily. To affected joint. 100 g 11  . Estradiol 10 MCG TABS  vaginal tablet Insert one tablet vaginally twice a week. 30 tablet 2  . FLUoxetine (PROZAC) 10 MG capsule TK 1 C PO D  2  . Magnesium 250 MG TABS Take by mouth.    . mometasone (NASONEX) 50 MCG/ACT nasal spray SHAKE LIQUID AND USE 1 SPRAY IN EACH NOSTRIL EVERY DAY AS DIRECTED 17 g 4  . nitroGLYCERIN (NITRODUR - DOSED IN MG/24 HR) 0.2 mg/hr patch 1/4 patch to tendon daily for tendonitis 30 patch 1  . Probiotic Product (PROBIOTIC PO) Take by mouth.    . traZODone (DESYREL) 100 MG tablet     . methocarbamol (ROBAXIN) 500 MG tablet Take 1 tablet (500 mg total) by mouth every 8 (eight) hours as needed for muscle spasms. 90 tablet 0   No current facility-administered medications for this visit.    Allergies  Allergen Reactions  . Biaxin [Clarithromycin]     "gives me lockjaw"  . Concerta [Methylphenidate] Other (See Comments)    Says affected her sleep, made her jittery and would suck in her lips  . Penicillins     "amoxicillin doesn't do anything for me anymore so I don't want it'      Exam:  BP 134/77   Pulse 63  Ht 5\' 3"  (1.6 m)   Wt 128 lb (58.1 kg)   LMP 04/05/2011   BMI 22.67 kg/m  General: Well Developed, well nourished, and in no acute distress.  Neuro/Psych: Alert and oriented x3, extra-ocular muscles intact, able to move all 4 extremities, sensation grossly intact. Skin: Warm and dry, no rashes noted.  Respiratory: Not using accessory muscles, speaking in full sentences, trachea midline.  Cardiovascular: Pulses palpable, no extremity edema. Abdomen: Does not appear distended. MSK:  C-spine: Nontender to midline Tender to palpation left trapezius and sternocleidomastoid Decreased neck motion due to pain especially with extension and left rotation and lateral flexion Upper extremity strength reflexes and sensation are intact  Left shoulder: Normal-appearing nontender Normal motion pain with abduction Strength is intact    CLINICAL DATA:  Left shoulder pain and  decreased range of motion since August, 2018. No known injury.  EXAM: MRI OF THE LEFT SHOULDER WITHOUT CONTRAST  TECHNIQUE: Multiplanar, multisequence MR imaging of the shoulder was performed. No intravenous contrast was administered.  COMPARISON:  Plain films right shoulder 01/22/2017.  FINDINGS: Rotator cuff: There is mild appearing supraspinatus and infraspinatus tendinopathy without tear. The subscapularis and teres minor appear normal.  Muscles:  Normal without atrophy or focal lesion.  Biceps long head:  Intact and normal in appearance.  Acromioclavicular Joint: Appears normal. Type 2 acromion. No subacromial/subdeltoid bursal fluid.  Glenohumeral Joint: Appears normal.  Labrum:  Intact.  Bones:  No fracture or worrisome lesion.  Other: None.  IMPRESSION: Mild appearing supraspinatus and infraspinatus tendinopathy without tear. The exam is otherwise negative.   Electronically Signed   By: Inge Rise M.D.   On: 01/29/2017 14:56   Assessment and Plan: 57 y.o. female with  Left Shoulder Pain: Clearly some of the pain due to rotator cuff tendinitis however the majority of the pain seems to be more related to cervical spine dysfunction pectoralis muscle tightness.   Plan for cervical spine x-ray.  Continue physical therapy we may approach the C-spine is a primary pain generator patient would like to delay injection of the left shoulder at this time  Will use Robaxin muscle relaxer as needed as well as this may help.  Orders Placed This Encounter  Procedures  . DG Cervical Spine Complete    Standing Status:   Future    Standing Expiration Date:   04/20/2018    Order Specific Question:   Reason for Exam (SYMPTOM  OR DIAGNOSIS REQUIRED)    Answer:   eval cspine pain    Order Specific Question:   Is patient pregnant?    Answer:   No    Order Specific Question:   Preferred imaging location?    Answer:   [facility]    Order  Specific Question:   Radiology Contrast Protocol - do NOT remove file path    Answer:   [email or web address]   Meds ordered this encounter  Medications  . methocarbamol (ROBAXIN) 500 MG tablet    Sig: Take 1 tablet (500 mg total) by mouth every 8 (eight) hours as needed for muscle spasms.    Dispense:  90 tablet    Refill:  0    Discussed warning signs or symptoms. Please see discharge instructions. Patient expresses understanding.  I spent 25 minutes with this patient, greater than 50% was face-to-face time counseling regarding ddx and treatment plan.

## 2017-02-19 NOTE — Patient Instructions (Signed)
Thank you for coming in today. Get xray soon.  Try Methocarbinol up to 3x daily as needed for muscle pain.  Continue PT.  I will get results to you after Xray.

## 2017-02-21 ENCOUNTER — Ambulatory Visit (INDEPENDENT_AMBULATORY_CARE_PROVIDER_SITE_OTHER): Payer: BC Managed Care – PPO

## 2017-02-21 DIAGNOSIS — M5031 Other cervical disc degeneration,  high cervical region: Secondary | ICD-10-CM

## 2017-02-21 DIAGNOSIS — M4802 Spinal stenosis, cervical region: Secondary | ICD-10-CM | POA: Diagnosis not present

## 2017-02-21 DIAGNOSIS — M542 Cervicalgia: Secondary | ICD-10-CM

## 2017-03-01 DIAGNOSIS — F419 Anxiety disorder, unspecified: Secondary | ICD-10-CM | POA: Insufficient documentation

## 2017-03-01 DIAGNOSIS — M542 Cervicalgia: Secondary | ICD-10-CM | POA: Insufficient documentation

## 2017-03-01 DIAGNOSIS — M898X1 Other specified disorders of bone, shoulder: Secondary | ICD-10-CM | POA: Insufficient documentation

## 2017-03-28 DIAGNOSIS — M5030 Other cervical disc degeneration, unspecified cervical region: Secondary | ICD-10-CM | POA: Insufficient documentation

## 2017-04-09 ENCOUNTER — Emergency Department (INDEPENDENT_AMBULATORY_CARE_PROVIDER_SITE_OTHER): Payer: BC Managed Care – PPO

## 2017-04-09 ENCOUNTER — Other Ambulatory Visit: Payer: Self-pay

## 2017-04-09 ENCOUNTER — Emergency Department
Admission: EM | Admit: 2017-04-09 | Discharge: 2017-04-09 | Disposition: A | Discharge status: Home / Self Care | Payer: BC Managed Care – PPO | Source: Home / Self Care | Attending: Emergency Medicine | Admitting: Emergency Medicine

## 2017-04-09 ENCOUNTER — Encounter: Payer: Self-pay | Admitting: Emergency Medicine

## 2017-04-09 DIAGNOSIS — R109 Unspecified abdominal pain: Secondary | ICD-10-CM

## 2017-04-09 DIAGNOSIS — R1010 Upper abdominal pain, unspecified: Secondary | ICD-10-CM | POA: Diagnosis not present

## 2017-04-09 DIAGNOSIS — K5909 Other constipation: Secondary | ICD-10-CM

## 2017-04-09 LAB — POCT URINALYSIS DIP (MANUAL ENTRY)
Bilirubin, UA: NEGATIVE
Blood, UA: NEGATIVE
GLUCOSE UA: NEGATIVE mg/dL
Ketones, POC UA: NEGATIVE mg/dL
Leukocytes, UA: NEGATIVE
NITRITE UA: NEGATIVE
PROTEIN UA: NEGATIVE mg/dL
Spec Grav, UA: 1.015 (ref 1.010–1.025)
UROBILINOGEN UA: 0.2 U/dL
pH, UA: 7.5 (ref 5.0–8.0)

## 2017-04-09 LAB — POCT CBC W AUTO DIFF (K'VILLE URGENT CARE)

## 2017-04-09 NOTE — ED Triage Notes (Signed)
Pt c/o mid epigastric pain x 03/29/17. She tried a 4 day liquid fast without relief.

## 2017-04-09 NOTE — ED Provider Notes (Signed)
As good as he is doing good which there is no future for medicine the system just feels Shirley Moody CARE    CSN: [id number] Arrival date & time: 04/09/17  1132     History   Chief Complaint Chief Complaint  Patient presents with  . Abdominal Pain    HPI Shirley Moody is a 57 y.o. female.  Patient presents with upper abdominal and left-sided abdominal pain.  She has been having normal bowel movements.  This began on 14 March.  She tried a 4-day fast but without improvement.  She does have a history of irritable bowel syndrome and diverticulosis.  She also has a history of Clostridium difficile in 2011 following taking antibiotics.  She has never had a CT.  She is a patient of digestive health .  She denies fever or chills.  She has had a good appetite.  She rarely takes nonsteroidals.  Very limited alcohol intake and a non-smoker.  She had been on Fosamax but not recently. HPI  Past Medical History:  Diagnosis Date  . Allergy   . Colon polyp   . Depression   . Diverticulitis   . IBS (irritable bowel syndrome)   . Osteoporosis 11/17/2015   DEXA 2017    Patient Active Problem List   Diagnosis Date Noted  . Rotator cuff tendonitis, left 12/14/2016  . Medial epicondylitis 11/02/2016  . Hallux rigidus of right foot 11/02/2016  . Bunionette of right foot 11/02/2016  . Urinary stream splitting 03/20/2016  . Osteoporosis 11/17/2015  . Postmenopausal atrophic vaginitis 02/01/2015  . Vulvar lesion 02/01/2015  . Primary osteoarthritis of left knee 06/26/2014  . Impingement syndrome of right shoulder 03/11/2013  . 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 Pain at left costal margin. 11/15/2011  . ADHD (attention deficit hyperactivity disorder) 12/05/2010  . GENERALIZED ANXIETY DISORDER 02/16/2010  . PAP SMEAR, LGSIL, ABNORMAL 02/16/2010  . DIVERTICULITIS, HX OF 01/22/2010  . ALLERGIC RHINITIS 11/16/2009  . COLONIC POLYPS, HYPERPLASTIC 01/27/2009  . DEPRESSION 02/01/2008     Past Surgical History:  Procedure Laterality Date  . DILATION AND CURETTAGE OF UTERUS    . NEUROMA SURGERY     Right foot    OB History    Gravida  1   Para  0   Term      Preterm      AB  1   Living        SAB      TAB      Ectopic      Multiple      Live Births               Home Medications    Prior to Admission medications   Medication Sig Start Date End Date Taking? Authorizing Provider  alendronate (FOSAMAX) 70 MG tablet TAKE 1 TABLET(70 MG) BY MOUTH 1 TIME A WEEK WITH A FULL GLASS OF WATER AND ON AN EMPTY STOMACH 11/03/16   Hali Marry, MD  buPROPion (WELLBUTRIN XL) 150 MG 24 hr tablet Take 450 mg by mouth daily.     [provider]  Calcium Carbonate (CALCIUM 600 PO) Take 600 mg by mouth.    [provider]  cetirizine (ZYRTEC) 10 MG tablet Take 10 mg by mouth daily.      [provider]  diclofenac sodium (VOLTAREN) 1 % GEL Apply 2 g topically 4 (four) times daily. To affected joint. 11/02/16   Gregor Hams, MD  Estradiol 10 MCG  TABS vaginal tablet Insert one tablet vaginally twice a week. 04/14/16   Guss Bunde, MD  FLUoxetine (PROZAC) 10 MG capsule TK 1 C PO D 03/05/16   [provider]  Magnesium 250 MG TABS Take by mouth.    [provider]  methocarbamol (ROBAXIN) 500 MG tablet Take 1 tablet (500 mg total) by mouth every 8 (eight) hours as needed for muscle spasms. 02/19/17   Gregor Hams, MD  mometasone (NASONEX) 50 MCG/ACT nasal spray SHAKE LIQUID AND USE 1 SPRAY IN EACH NOSTRIL EVERY DAY AS DIRECTED 06/16/16   Hali Marry, MD  nitroGLYCERIN (NITRODUR - DOSED IN MG/24 HR) 0.2 mg/hr patch 1/4 patch to tendon daily for tendonitis 12/14/16   Gregor Hams, MD  Probiotic Product (PROBIOTIC PO) Take by mouth.    [provider]  traZODone (DESYREL) 100 MG tablet  03/25/16   [provider]    Family History Family History  Problem Relation Age of Onset  .  Alcohol abuse Mother   . COPD Mother   . Hypertension Father   . Other Father 71       AMI/ CVA at 68  . Stroke Father   . Colon cancer Father     Social History Social History   Tobacco Use  . Smoking status: Former Research scientist (life sciences)  . Smokeless tobacco: Never Used  Substance Use Topics  . Alcohol use: Yes    Alcohol/week: 0.0 oz    Comment: 1 drink/month  . Drug use: No     Allergies   Biaxin [clarithromycin]; Concerta [methylphenidate]; and Penicillins   Review of Systems Review of Systems  Constitutional: Negative.   Respiratory: Negative.   Cardiovascular: Negative.   Gastrointestinal: Positive for abdominal pain. Negative for blood in stool, diarrhea, nausea and vomiting.  Endocrine: Negative.   Genitourinary: Negative.        She is postmenopausal.     Physical Exam Triage Vital Signs ED Triage Vitals  Enc Vitals Group     BP 04/09/17 1206 131/79     Pulse Rate 04/09/17 1206 71     Resp 04/09/17 1206 16     Temp 04/09/17 1206 98.2 F (36.8 C)     Temp Source 04/09/17 1206 Oral     SpO2 04/09/17 1206 99 %     Weight 04/09/17 1208 128 lb (58.1 kg)     Height 04/09/17 1208 5\' 4"  (1.626 m)     Head Circumference --      Peak Flow --      Pain Score 04/09/17 1207 6     Pain Loc --      Pain Edu? --      Excl. in [place]? --    No data found.  Updated Vital Signs BP 131/79 (BP Location: Right Arm)   Pulse 71   Temp 98.2 F (36.8 C) (Oral)   Resp 16   Ht 5\' 4"  (1.626 m)   Wt 128 lb (58.1 kg)   LMP 04/05/2011   SpO2 99%   BMI 21.97 kg/m   Visual Acuity Right Eye Distance:   Left Eye Distance:   Bilateral Distance:    Right Eye Near:   Left Eye Near:    Bilateral Near:     Physical Exam  Constitutional: She appears well-developed and well-nourished.  HENT:  Head: Normocephalic and atraumatic.  Eyes: No scleral icterus.  Pulmonary/Chest: Effort normal and breath sounds normal.  Abdominal: Normal appearance.  There is tenderness  to palpation mid  epigastrium extending to the left upper abdomen and through the left side of the abdomen.  There is no rebound.  There is no mass felt.  There is no CVA tenderness.     UC Treatments / Results  Labs (all labs ordered are listed, but only abnormal results are displayed) Labs Reviewed  COMPLETE METABOLIC PANEL WITH GFR  AMYLASE  LIPASE  POCT CBC W AUTO DIFF ([place])  POCT URINALYSIS DIP (MANUAL ENTRY)    EKG None Radiology Dg Abdomen Acute W/chest  Result Date: 04/09/2017 CLINICAL DATA:  Upper abdominal pain for 1 week EXAM: DG ABDOMEN ACUTE W/ 1V CHEST COMPARISON:  Chest x-ray 10/13/2015 FINDINGS: Biapical scarring. Mild hyperinflation. Heart is normal size. No effusions. There is normal bowel gas pattern. No free air. No organomegaly or suspicious calcification. No acute bony abnormality. IMPRESSION: Negative abdominal radiographs.  No acute cardiopulmonary disease. Mild hyperinflation of the lungs.  Biapical scarring. Electronically Signed   By: Rolm Baptise M.D.   On: 04/09/2017 13:20    Procedures Procedures (including critical care time)  Medications Ordered in UC Medications - No data to display   Initial Impression / Assessment and Plan / UC Course  I have reviewed the triage vital signs and the nursing notes.  Pertinent labs & imaging results that were available during my care of the patient were reviewed by me and considered in my medical decision making (see chart for details). X-rays consistent with constipation no acute changes seen..  White count is 3100 and will need follow-up.  Will treat with MiraLAX and a modified diet.  Follow-up with her primary care physician.  UA checked was within normal limits.      Final Clinical Impressions(s) / UC Diagnoses   Final diagnoses:  Pain of upper abdomen  Other constipation    ED Discharge Orders    None      Take miralax as instructed. Follow-up with your primary care physician regarding your low  white count. F/u CXR with your PCP Controlled Substance Prescriptions [place] Controlled Substance Registry consulted? Not Applicable   Darlyne Russian, MD 04/09/17 [phone]

## 2017-04-09 NOTE — Discharge Instructions (Addendum)
Take miralax as instructed. Follow-up with your primary care physician regarding your low white count.Followup your CXR with your PCP

## 2017-04-10 ENCOUNTER — Other Ambulatory Visit: Payer: Self-pay | Admitting: Emergency Medicine

## 2017-04-10 ENCOUNTER — Telehealth: Payer: Self-pay

## 2017-04-10 ENCOUNTER — Ambulatory Visit (INDEPENDENT_AMBULATORY_CARE_PROVIDER_SITE_OTHER): Payer: BC Managed Care – PPO

## 2017-04-10 DIAGNOSIS — R1011 Right upper quadrant pain: Secondary | ICD-10-CM

## 2017-04-10 NOTE — Telephone Encounter (Signed)
[place] ordered per Dr. Everlene Farrier, and hep C lab ordered per Dr Everlene Farrier

## 2017-04-12 LAB — COMPLETE METABOLIC PANEL WITH GFR
AG Ratio: 2.2 (calc) (ref 1.0–2.5)
ALBUMIN MSPROF: 4.2 g/dL (ref 3.6–5.1)
ALKALINE PHOSPHATASE (APISO): 36 U/L (ref 33–130)
ALT: 48 U/L — AB (ref 6–29)
AST: 39 U/L — ABNORMAL HIGH (ref 10–35)
BILIRUBIN TOTAL: 0.4 mg/dL (ref 0.2–1.2)
BUN: 10 mg/dL (ref 7–25)
CHLORIDE: 106 mmol/L (ref 98–110)
CO2: 29 mmol/L (ref 20–32)
CREATININE: 0.73 mg/dL (ref 0.50–1.05)
Calcium: 9.3 mg/dL (ref 8.6–10.4)
GFR, Est African American: 107 mL/min/{1.73_m2} (ref >=60)
GFR, Est Non African American: 92 mL/min/{1.73_m2} (ref >=60)
GLUCOSE: 90 mg/dL (ref 65–99)
Globulin: 1.9 g/dL (calc) (ref 1.9–3.7)
Potassium: 3.8 mmol/L (ref 3.5–5.3)
Sodium: 141 mmol/L (ref 135–146)
TOTAL PROTEIN: 6.1 g/dL (ref 6.1–8.1)

## 2017-04-12 LAB — HEPATITIS PANEL, ACUTE
HEP B C IGM: NONREACTIVE
HEP B S AG: NONREACTIVE
Hep A IgM: NONREACTIVE
Hepatitis C Ab: NONREACTIVE
SIGNAL TO CUT-OFF: 0.01 (ref <1.00)

## 2017-04-12 LAB — AMYLASE: Amylase: 55 U/L (ref 21–101)

## 2017-04-12 LAB — TEST AUTHORIZATION

## 2017-04-12 LAB — LIPASE: Lipase: 25 U/L (ref 7–60)

## 2017-04-12 NOTE — Telephone Encounter (Signed)
I called patient on Tuesday night and gave her the results.  She will follow-up with Dr. Charise Carwin.  She was given advice regarding when to go to the emergency room.  If she develops fever worsening left lower quadrant abdominal pain nausea or vomiting she will go to the emergency room for evaluation.

## 2017-04-12 NOTE — Telephone Encounter (Signed)
Let message for patient to return call to office for lab results.

## 2017-04-17 ENCOUNTER — Ambulatory Visit: Payer: BC Managed Care – PPO | Admitting: Family Medicine

## 2017-04-17 ENCOUNTER — Encounter: Payer: Self-pay | Admitting: Family Medicine

## 2017-04-17 VITALS — BP 134/54 | HR 73 | Ht 63.0 in | Wt 131.0 lb

## 2017-04-17 DIAGNOSIS — R6881 Early satiety: Secondary | ICD-10-CM

## 2017-04-17 DIAGNOSIS — R140 Abdominal distension (gaseous): Secondary | ICD-10-CM | POA: Diagnosis not present

## 2017-04-17 DIAGNOSIS — R748 Abnormal levels of other serum enzymes: Secondary | ICD-10-CM | POA: Diagnosis not present

## 2017-04-17 DIAGNOSIS — K2100 Gastro-esophageal reflux disease with esophagitis, without bleeding: Secondary | ICD-10-CM

## 2017-04-17 DIAGNOSIS — R1013 Epigastric pain: Secondary | ICD-10-CM | POA: Diagnosis not present

## 2017-04-17 LAB — HEPATIC FUNCTION PANEL
AG Ratio: 1.9 (calc) (ref 1.0–2.5)
ALBUMIN MSPROF: 4.2 g/dL (ref 3.6–5.1)
ALT: 36 U/L — ABNORMAL HIGH (ref 6–29)
AST: 30 U/L (ref 10–35)
Alkaline phosphatase (APISO): 45 U/L (ref 33–130)
Bilirubin, Direct: 0.1 mg/dL (ref 0.0–0.2)
GLOBULIN: 2.2 g/dL (ref 1.9–3.7)
Indirect Bilirubin: 0.2 mg/dL (calc) (ref 0.2–1.2)
TOTAL PROTEIN: 6.4 g/dL (ref 6.1–8.1)
Total Bilirubin: 0.3 mg/dL (ref 0.2–1.2)

## 2017-04-17 NOTE — Progress Notes (Signed)
Subjective:    Patient ID: Shirley Moody, female    DOB: 04/06/1960, 57 y.o.   MRN: [id number]  HPI   57 year old female comes in today complaining of upper abdominal pain for about 2 weeks.  She says she remembers having some discomfort and pain around March 8 after eating some nodules.  She felt extremely distended and had some discomfort.  When she eats just a little she feels very bloated.  She is been having heartburn where she feels like food is coming back up all the way up to her throat.  She says immediately after eating she will feel very full in her epigastric area and then starts to get pain and burping.  She has really cut out her dairy and gluten to see if that would help but it has not made a big difference.    She went to urgent care on 3/25.  She had a plain film as well as an ultrasound done.  Her symptoms were felt to be most consistent with constipation and so she was started on MiraLAX which she has been taking since then.  She is concerned because she says this feels similar to when she had C. Difficile in 2011.  She says her stools have been foamy and dense.  She is been having on average about 3/day.  2 by about noon and then 1 in the evening.  She just felt extremely tired and lethargic.  She did run a fever about a week ago.  She did try taking the MiraLAX for a week but says it did not really seem to do a whole lot.  She has not had any problems swallowing.  No blood in the stool.   Review of Systems   She has been having some sneezing and postnasal drip that started right around the same time.   BP (!) 134/54   Pulse 73   Ht 5\' 3"  (1.6 m)   Wt 131 lb (59.4 kg)   LMP 04/05/2011   SpO2 100%   BMI 23.21 kg/m     Allergies  Allergen Reactions  . Biotin Other (See Comments)    Other reaction(s): Other Taste metallic Taste metallic   . Biaxin [Clarithromycin]     "gives me lockjaw"  . Concerta [Methylphenidate] Other (See Comments)    Says affected her  sleep, made her jittery and would suck in her lips  . Penicillins     "amoxicillin doesn't do anything for me anymore so I don't want it'     Past Medical History:  Diagnosis Date  . Allergy   . Colon polyp   . Depression   . Diverticulitis   . IBS (irritable bowel syndrome)   . Osteoporosis 11/17/2015   DEXA 2017    Past Surgical History:  Procedure Laterality Date  . DILATION AND CURETTAGE OF UTERUS    . NEUROMA SURGERY     Right foot    Social History   Socioeconomic History  . Marital status: Single    Spouse name: Not on file  . Number of children: Not on file  . Years of education: Not on file  . Highest education level: Not on file  Occupational History  . 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 Financial resource strain: Not on file  . Food insecurity:    Worry: Not on file    Inability: Not on file  .  Transportation needs:    Medical: Not on file    Non-medical: Not on file  Tobacco Use  . Smoking status: Former Research scientist (life sciences)  . Smokeless tobacco: Never Used  Substance and Sexual Activity  . Alcohol use: Yes    Alcohol/week: 0.0 oz    Comment: 1 drink/month  . Drug use: No  . Sexual activity: Not Currently    Partners: Male    Birth control/protection: Post-menopausal  Lifestyle  . Physical activity:    Days per week: Not on file    Minutes per session: Not on file  . Stress: Not on file  Relationships  . Social connections:    Talks on phone: Not on file    Gets together: Not on file    Attends religious service: Not on file    Active member of club or organization: Not on file    Attends meetings of clubs or organizations: Not on file    Relationship status: Not on file  . Intimate partner violence:    Fear of current or ex partner: Not on file    Emotionally abused: Not on file    Physically abused: Not on file    Forced sexual activity: Not on file  Other Topics Concern  . Not  on file  Social History Narrative  . Not on file    Family History  Problem Relation Age of Onset  . Alcohol abuse Mother   . COPD Mother   . Hypertension Father   . Other Father 20       AMI/ CVA at 65  . Stroke Father   . Colon cancer Father     Outpatient Encounter Medications as of 04/17/2017  Medication Sig  . alendronate (FOSAMAX) 70 MG tablet TAKE 1 TABLET(70 MG) BY MOUTH 1 TIME A WEEK WITH A FULL GLASS OF WATER AND ON AN EMPTY STOMACH  . buPROPion (WELLBUTRIN XL) 150 MG 24 hr tablet Take 450 mg by mouth daily.   . Calcium Carbonate (CALCIUM 600 PO) Take 600 mg by mouth.  . cetirizine (ZYRTEC) 10 MG tablet Take 10 mg by mouth daily.    . Estradiol 10 MCG TABS vaginal tablet Insert one tablet vaginally twice a week.  Marland Kitchen FLUoxetine (PROZAC) 10 MG capsule TK 1 C PO D  . Magnesium 250 MG TABS Take by mouth.  . mometasone (NASONEX) 50 MCG/ACT nasal spray SHAKE LIQUID AND USE 1 SPRAY IN EACH NOSTRIL EVERY DAY AS DIRECTED  . traZODone (DESYREL) 100 MG tablet   . [DISCONTINUED] diclofenac sodium (VOLTAREN) 1 % GEL Apply 2 g topically 4 (four) times daily. To affected joint.  . [DISCONTINUED] methocarbamol (ROBAXIN) 500 MG tablet Take 1 tablet (500 mg total) by mouth every 8 (eight) hours as needed for muscle spasms.  . [DISCONTINUED] nitroGLYCERIN (NITRODUR - DOSED IN MG/24 HR) 0.2 mg/hr patch 1/4 patch to tendon daily for tendonitis  . [DISCONTINUED] Probiotic Product (PROBIOTIC PO) Take by mouth.   No facility-administered encounter medications on file as of 04/17/2017.          Objective:   Physical Exam  Constitutional: She is oriented to person, place, and time. She appears well-developed and well-nourished.  HENT:  Head: Normocephalic and atraumatic.  Cardiovascular: Normal rate, regular rhythm and normal heart sounds.  Pulmonary/Chest: Effort normal and breath sounds normal.  Abdominal: Soft. Bowel sounds are normal. She exhibits no distension and no mass. There is  tenderness. There is no rebound and no guarding.  Neurological: She is alert and oriented to person, place, and time.  Skin: Skin is warm and dry.  Psychiatric: She has a normal mood and affect. Her behavior is normal.       Assessment & Plan:  Epigastric pain with bloating and early satiety-evaluate for H. pylori.  She also had some elevated liver enzymes it was a mild elevation at urgent care.  Plan to recheck check those to see if they are trending back to normal or they could be increasing.  He is Artie cut out gluten and dairy just to see if that would help. Consider HIDA scan.    Abnormal stools-we will check stool culture as well as for C. Difficile.   Elevated liver enzymes - Due to recheck and see if trending up or returning to normal.    GERD- If neg for H pylor then consider trial of PPI for 2 weeks.

## 2017-04-18 LAB — H. PYLORI BREATH TEST: H. pylori Breath Test: NOT DETECTED

## 2017-04-19 NOTE — Progress Notes (Signed)
Pt returned called to office and was informed of the results. W.Ahlana Slaydon, CMA

## 2017-04-21 LAB — STOOL CULTURE
MICRO NUMBER:: 90411806
MICRO NUMBER:: 90411807
MICRO NUMBER:: 90411808
SHIGA RESULT:: NOT DETECTED
SPECIMEN QUALITY: ADEQUATE
SPECIMEN QUALITY:: ADEQUATE
SPECIMEN QUALITY:: ADEQUATE

## 2017-04-21 LAB — CLOSTRIDIUM DIFFICILE TOXIN B, QUALITATIVE, REAL-TIME PCR: Toxigenic C. Difficile by PCR: NOT DETECTED

## 2017-04-24 ENCOUNTER — Other Ambulatory Visit: Payer: Self-pay | Admitting: *Deleted

## 2017-04-24 DIAGNOSIS — R1013 Epigastric pain: Secondary | ICD-10-CM

## 2017-04-24 DIAGNOSIS — K2100 Gastro-esophageal reflux disease with esophagitis, without bleeding: Secondary | ICD-10-CM

## 2017-04-30 ENCOUNTER — Other Ambulatory Visit: Payer: Self-pay | Admitting: Family Medicine

## 2017-04-30 DIAGNOSIS — Z1231 Encounter for screening mammogram for malignant neoplasm of breast: Secondary | ICD-10-CM

## 2017-05-22 ENCOUNTER — Ambulatory Visit
Admission: RE | Admit: 2017-05-22 | Discharge: 2017-05-22 | Disposition: A | Discharge status: Home / Self Care | Payer: BC Managed Care – PPO | Source: Ambulatory Visit | Attending: Family Medicine | Admitting: Family Medicine

## 2017-05-22 DIAGNOSIS — Z1231 Encounter for screening mammogram for malignant neoplasm of breast: Secondary | ICD-10-CM

## 2017-06-29 DIAGNOSIS — K219 Gastro-esophageal reflux disease without esophagitis: Secondary | ICD-10-CM | POA: Insufficient documentation

## 2017-07-04 DIAGNOSIS — Z860100 Personal history of colon polyps, unspecified: Secondary | ICD-10-CM | POA: Insufficient documentation

## 2017-07-05 ENCOUNTER — Other Ambulatory Visit: Payer: Self-pay | Admitting: Family Medicine

## 2017-07-09 ENCOUNTER — Other Ambulatory Visit: Payer: Self-pay | Admitting: Family Medicine

## 2017-07-09 MED ORDER — MOMETASONE FUROATE 50 MCG/ACT NA SUSP: NASAL | 0 refills | Status: DC

## 2017-07-20 ENCOUNTER — Encounter: Payer: Self-pay | Admitting: Family Medicine

## 2017-07-24 ENCOUNTER — Other Ambulatory Visit: Payer: Self-pay | Admitting: *Deleted

## 2017-07-24 ENCOUNTER — Encounter: Payer: Self-pay | Admitting: *Deleted

## 2017-07-24 DIAGNOSIS — M7582 Other shoulder lesions, left shoulder: Secondary | ICD-10-CM

## 2017-07-24 DIAGNOSIS — M545 Low back pain, unspecified: Secondary | ICD-10-CM

## 2017-07-24 DIAGNOSIS — M7700 Medial epicondylitis, unspecified elbow: Secondary | ICD-10-CM

## 2017-07-24 HISTORY — DX: Low back pain, unspecified: M54.50

## 2017-07-24 MED ORDER — AMBULATORY NON FORMULARY MEDICATION: 0 refills | Status: DC

## 2017-07-24 NOTE — Progress Notes (Signed)
Order for massage therapist placed for pt.Shirley Moody, [name]

## 2017-08-08 ENCOUNTER — Ambulatory Visit: Payer: Self-pay | Admitting: Sports Medicine

## 2017-09-04 ENCOUNTER — Encounter: Payer: Self-pay | Admitting: Sports Medicine

## 2017-09-04 ENCOUNTER — Ambulatory Visit: Payer: BC Managed Care – PPO | Admitting: Sports Medicine

## 2017-09-04 DIAGNOSIS — M7502 Adhesive capsulitis of left shoulder: Secondary | ICD-10-CM | POA: Diagnosis not present

## 2017-09-04 DIAGNOSIS — M67912 Unspecified disorder of synovium and tendon, left shoulder: Secondary | ICD-10-CM | POA: Insufficient documentation

## 2017-09-04 MED ORDER — OXYCODONE-ACETAMINOPHEN 10-325 MG PO TABS: 1.0000 | ORAL_TABLET | Freq: Three times a day (TID) | ORAL | 0 refills | Status: DC | PRN

## 2017-09-04 NOTE — Assessment & Plan Note (Addendum)
Severe worsening pain and progressive loss of range of motion. Glenohumeral and subacromial injections. Adding a bit of high-dose Percocet for the time being. Return to see me in 1 month, rehab exercises given.

## 2017-09-04 NOTE — Progress Notes (Signed)
Subjective:    I'm seeing this patient as a consultation for: Dr. Beatrice Lecher  CC: Left shoulder pain  HPI: This is a pleasant 57 year old female, for the past several weeks she is had worsening pain in her left shoulder, localized at the anterior joint line with progressive loss of motion.  No trauma, no constitutional symptoms.  Severe now.  I reviewed the past medical history, family history, social history, surgical history, and allergies today and no changes were needed.  Please see the problem list section below in epic for further details.  Past Medical History: Past Medical History:  Diagnosis Date  . Allergy   . Colon polyp   . Depression   . Diverticulitis   . IBS (irritable bowel syndrome)   . Low back pain 07/24/2017  . Osteoporosis 11/17/2015   DEXA 2017   Past Surgical History: Past Surgical History:  Procedure Laterality Date  . DILATION AND CURETTAGE OF UTERUS    . NEUROMA SURGERY     Right foot   Social History: Social History   Socioeconomic History  . Marital status: Single    Spouse name: Not on file  . Number of children: Not on file  . Years of education: Not on file  . Highest education level: Not on file  Occupational History  . 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 Financial resource strain: Not on file  . Food insecurity:    Worry: Not on file    Inability: Not on file  . Transportation needs:    Medical: Not on file    Non-medical: Not on file  Tobacco Use  . Smoking status: Former Research scientist (life sciences)  . Smokeless tobacco: Never Used  Substance and Sexual Activity  . Alcohol use: Yes    Alcohol/week: 0.0 standard drinks    Comment: 1 drink/month  . Drug use: No  . Sexual activity: Not Currently    Partners: Male    Birth control/protection: Post-menopausal  Lifestyle  . Physical activity:    Days per week: Not on file    Minutes per session: Not on file  . Stress:  Not on file  Relationships  . Social connections:    Talks on phone: Not on file    Gets together: Not on file    Attends religious service: Not on file    Active member of club or organization: Not on file    Attends meetings of clubs or organizations: Not on file    Relationship status: Not on file  Other Topics Concern  . Not on file  Social History Narrative  . Not on file   Family History: Family History  Problem Relation Age of Onset  . Alcohol abuse Mother   . COPD Mother   . Hypertension Father   . Other Father 40       AMI/ CVA at 61  . Stroke Father   . Colon cancer Father    Allergies: Allergies  Allergen Reactions  . Biotin Other (See Comments)    Other reaction(s): Other Taste metallic Taste metallic   . Biaxin [Clarithromycin]     "gives me lockjaw"  . Concerta [Methylphenidate] Other (See Comments)    Says affected her sleep, made her jittery and would suck in her lips  . Penicillins     "amoxicillin doesn't do anything for me anymore so I don't want it'    Medications: See med  rec.  Review of Systems: No headache, visual changes, nausea, vomiting, diarrhea, constipation, dizziness, abdominal pain, skin rash, fevers, chills, night sweats, weight loss, swollen lymph nodes, body aches, joint swelling, muscle aches, chest pain, shortness of breath, mood changes, visual or auditory hallucinations.   Objective:   General: Well Developed, well nourished, and in no acute distress.  Neuro:  Extra-ocular muscles intact, able to move all 4 extremities, sensation grossly intact.  Deep tendon reflexes tested were normal. Psych: Alert and oriented, mood congruent with affect. ENT:  Ears and nose appear unremarkable.  Hearing grossly normal. Neck: Unremarkable overall appearance, trachea midline.  No visible thyroid enlargement. Eyes: Conjunctivae and lids appear unremarkable.  Pupils equal and round. Skin: Warm and dry, no rashes noted.  Cardiovascular: Pulses  palpable, no extremity edema. Left Shoulder: Inspection reveals no abnormalities, atrophy or asymmetry. Palpation is normal with no tenderness over AC joint or bicipital groove. Lacks ROM, external rotation to only 10 degrees, abduction to 30 degrees. Rotator cuff strength normal throughout. Positive Neer and Hawkin's tests, empty can. Speeds and Yergason's tests normal. No labral pathology noted with negative Obrien's, negative crank, negative clunk, and good stability. Normal scapular function observed. No painful arc and no drop arm sign. No apprehension sign  Procedure: Real-time Ultrasound Guided Injection of left subacromial bursa Device: GE Logiq E  Verbal informed consent obtained.  Time-out conducted.  Noted no overlying erythema, induration, or other signs of local infection.  Skin prepped in a sterile fashion.  Local anesthesia: Topical Ethyl chloride.  With sterile technique and under real time ultrasound guidance: 1 cc kenalog 40, 1 cc lidocaine, 1 cc bupivacaine injected easily Completed without difficulty  Pain immediately resolved suggesting accurate placement of the medication.  Advised to call if fevers/chills, erythema, induration, drainage, or persistent bleeding.  Images permanently stored and available for review in the ultrasound unit.  Impression: Technically successful ultrasound guided injection.  Procedure: Real-time Ultrasound Guided Injection of left glenohumeral joint Device: GE Logiq E  Verbal informed consent obtained.  Time-out conducted.  Noted no overlying erythema, induration, or other signs of local infection.  Skin prepped in a sterile fashion.  Local anesthesia: Topical Ethyl chloride.  With sterile technique and under real time ultrasound guidance: Using a posterior approach I advanced into the [facility] humeral joint and injected 1 cc Kenalog 40, 2 cc lidocaine, 2 cc bupivacaine Completed without difficulty  Pain immediately resolved suggesting  accurate placement of the medication.  Advised to call if fevers/chills, erythema, induration, drainage, or persistent bleeding.  Images permanently stored and available for review in the ultrasound unit.  Impression: Technically successful ultrasound guided injection.  Impression and Recommendations:   This case required medical decision making of moderate complexity.  Adhesive capsulitis of left shoulder Severe worsening pain and progressive loss of range of motion. Glenohumeral and subacromial injections. Adding a bit of high-dose Percocet for the time being. Return to see me in 1 month, rehab exercises given. ___________________________________________ Gwen Her. Dianah Field, M.D., ABFM., CAQSM. Primary Care and [place] Instructor of [place] of [facility] of Medicine

## 2017-09-09 ENCOUNTER — Encounter: Payer: Self-pay | Admitting: Sports Medicine

## 2017-09-09 DIAGNOSIS — M7502 Adhesive capsulitis of left shoulder: Secondary | ICD-10-CM

## 2017-09-10 MED ORDER — OXYCODONE-ACETAMINOPHEN 10-325 MG PO TABS: 1.0000 | ORAL_TABLET | Freq: Three times a day (TID) | ORAL | 0 refills | Status: DC | PRN

## 2017-09-11 ENCOUNTER — Encounter: Payer: Self-pay | Admitting: Sports Medicine

## 2017-09-11 DIAGNOSIS — M7502 Adhesive capsulitis of left shoulder: Secondary | ICD-10-CM

## 2017-09-12 MED ORDER — OXYCODONE-ACETAMINOPHEN 10-325 MG PO TABS: 1.0000 | ORAL_TABLET | Freq: Three times a day (TID) | ORAL | 0 refills | Status: DC | PRN

## 2017-10-03 ENCOUNTER — Encounter: Payer: Self-pay | Admitting: Sports Medicine

## 2017-10-03 ENCOUNTER — Ambulatory Visit: Payer: BC Managed Care – PPO | Admitting: Sports Medicine

## 2017-10-03 VITALS — BP 131/73 | HR 81

## 2017-10-03 DIAGNOSIS — M7502 Adhesive capsulitis of left shoulder: Secondary | ICD-10-CM

## 2017-10-03 DIAGNOSIS — Z23 Encounter for immunization: Secondary | ICD-10-CM | POA: Diagnosis not present

## 2017-10-03 NOTE — Addendum Note (Signed)
Addended by: Beatris Ship L on: 10/03/2017 10:06 AM   Modules accepted: Orders

## 2017-10-03 NOTE — Progress Notes (Signed)
Subjective:    CC: Follow-up  HPI: This is a pleasant 57 year old female, I saw her a month ago with a nickel adhesive capsulitis, we did glenohumeral and subacromial injections, she returns today for the most part pain-free.  I reviewed the past medical history, family history, social history, surgical history, and allergies today and no changes were needed.  Please see the problem list section below in epic for further details.  Past Medical History: Past Medical History:  Diagnosis Date  . Allergy   . Colon polyp   . Depression   . Diverticulitis   . IBS (irritable bowel syndrome)   . Low back pain 07/24/2017  . Osteoporosis 11/17/2015   DEXA 2017   Past Surgical History: Past Surgical History:  Procedure Laterality Date  . DILATION AND CURETTAGE OF UTERUS    . NEUROMA SURGERY     Right foot   Social History: Social History   Socioeconomic History  . Marital status: Single    Spouse name: Not on file  . Number of children: Not on file  . Years of education: Not on file  . Highest education level: Not on file  Occupational History  . 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 Financial resource strain: Not on file  . Food insecurity:    Worry: Not on file    Inability: Not on file  . Transportation needs:    Medical: Not on file    Non-medical: Not on file  Tobacco Use  . Smoking status: Former Research scientist (life sciences)  . Smokeless tobacco: Never Used  Substance and Sexual Activity  . Alcohol use: Yes    Alcohol/week: 0.0 standard drinks    Comment: 1 drink/month  . Drug use: No  . Sexual activity: Not Currently    Partners: Male    Birth control/protection: Post-menopausal  Lifestyle  . Physical activity:    Days per week: Not on file    Minutes per session: Not on file  . Stress: Not on file  Relationships  . Social connections:    Talks on phone: Not on file    Gets together: Not on file    Attends  religious service: Not on file    Active member of club or organization: Not on file    Attends meetings of clubs or organizations: Not on file    Relationship status: Not on file  Other Topics Concern  . Not on file  Social History Narrative  . Not on file   Family History: Family History  Problem Relation Age of Onset  . Alcohol abuse Mother   . COPD Mother   . Hypertension Father   . Other Father 20       AMI/ CVA at 28  . Stroke Father   . Colon cancer Father    Allergies: Allergies  Allergen Reactions  . Biotin Other (See Comments)    Other reaction(s): Other Taste metallic Taste metallic   . Biaxin [Clarithromycin]     "gives me lockjaw"  . Concerta [Methylphenidate] Other (See Comments)    Says affected her sleep, made her jittery and would suck in her lips  . Penicillins     "amoxicillin doesn't do anything for me anymore so I don't want it'    Medications: See med rec.  Review of Systems: No fevers, chills, night sweats, weight loss, chest pain, or shortness of breath.   Objective:  General: Well Developed, well nourished, and in no acute distress.  Neuro: Alert and oriented x3, extra-ocular muscles intact, sensation grossly intact.  HEENT: Normocephalic, atraumatic, pupils equal round reactive to light, neck supple, no masses, no lymphadenopathy, thyroid nonpalpable.  Skin: Warm and dry, no rashes. Cardiac: Regular rate and rhythm, no murmurs rubs or gallops, no lower extremity edema.  Respiratory: Clear to auscultation bilaterally. Not using accessory muscles, speaking in full sentences. Left shoulder: Inspection reveals no abnormalities, atrophy or asymmetry. Palpation is normal with no tenderness over AC joint or bicipital groove. ROM is full in all planes. Rotator cuff strength normal throughout. No signs of impingement with negative Neer and Hawkin's tests, empty can. Speeds and Yergason's tests normal. No labral pathology noted with negative  Obrien's, negative crank, negative clunk, and good stability. Normal scapular function observed. No painful arc and no drop arm sign. No apprehension sign  Impression and Recommendations:    Adhesive capsulitis of left shoulder Symptoms now resolved after glenohumeral and subacromial injections at the last visit, still has maybe 5 to 10 degrees of external rotation lag compared to the contralateral side but she will work this out with home rehab exercises. Return as needed. __________________________________________ Gwen Her. Dianah Field, M.D., ABFM., CAQSM. Primary Care and [place] Instructor of [place] of [facility] of Medicine

## 2017-10-03 NOTE — Assessment & Plan Note (Signed)
Symptoms now resolved after glenohumeral and subacromial injections at the last visit, still has maybe 5 to 10 degrees of external rotation lag compared to the contralateral side but she will work this out with home rehab exercises. Return as needed.

## 2017-12-22 ENCOUNTER — Other Ambulatory Visit: Payer: Self-pay | Admitting: Family Medicine

## 2017-12-31 ENCOUNTER — Encounter: Payer: Self-pay | Admitting: Family Medicine

## 2018-01-13 ENCOUNTER — Encounter: Payer: Self-pay | Admitting: Sports Medicine

## 2018-01-13 ENCOUNTER — Encounter: Payer: Self-pay | Admitting: Family Medicine

## 2018-01-14 NOTE — Telephone Encounter (Signed)
I am okay with scheduling her for maybe 1:15 tomorrow.  Please call and let her know.

## 2018-01-15 ENCOUNTER — Encounter: Payer: Self-pay | Admitting: Sports Medicine

## 2018-01-15 ENCOUNTER — Ambulatory Visit: Payer: BC Managed Care – PPO | Admitting: Sports Medicine

## 2018-01-15 DIAGNOSIS — M7502 Adhesive capsulitis of left shoulder: Secondary | ICD-10-CM

## 2018-01-15 NOTE — Assessment & Plan Note (Signed)
Previous injection was 3-1/2 months ago, repeat left glenohumeral injection. Return as needed.

## 2018-01-15 NOTE — Progress Notes (Signed)
Subjective:    CC: Left shoulder pain  HPI: This is a pleasant 57 year old female with a history of adhesive capsulitis, we did an injection many many months ago, she did extremely well, now having recurrence of pain, moderate, persistent, desires repeat interventional treatment today.  Pain is localized without radiation, mild loss of range of motion.  I reviewed the past medical history, family history, social history, surgical history, and allergies today and no changes were needed.  Please see the problem list section below in epic for further details.  Past Medical History: Past Medical History:  Diagnosis Date  . Allergy   . Colon polyp   . Depression   . Diverticulitis   . IBS (irritable bowel syndrome)   . Low back pain 07/24/2017  . Osteoporosis 11/17/2015   DEXA 2017   Past Surgical History: Past Surgical History:  Procedure Laterality Date  . DILATION AND CURETTAGE OF UTERUS    . NEUROMA SURGERY     Right foot   Social History: Social History   Socioeconomic History  . Marital status: Single    Spouse name: Not on file  . Number of children: Not on file  . Years of education: Not on file  . Highest education level: Not on file  Occupational History  . 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 Financial resource strain: Not on file  . Food insecurity:    Worry: Not on file    Inability: Not on file  . Transportation needs:    Medical: Not on file    Non-medical: Not on file  Tobacco Use  . Smoking status: Former Research scientist (life sciences)  . Smokeless tobacco: Never Used  Substance and Sexual Activity  . Alcohol use: Yes    Alcohol/week: 0.0 standard drinks    Comment: 1 drink/month  . Drug use: No  . Sexual activity: Not Currently    Partners: Male    Birth control/protection: Post-menopausal  Lifestyle  . Physical activity:    Days per week: Not on file    Minutes per session: Not on file  . Stress: Not  on file  Relationships  . Social connections:    Talks on phone: Not on file    Gets together: Not on file    Attends religious service: Not on file    Active member of club or organization: Not on file    Attends meetings of clubs or organizations: Not on file    Relationship status: Not on file  Other Topics Concern  . Not on file  Social History Narrative  . Not on file   Family History: Family History  Problem Relation Age of Onset  . Alcohol abuse Mother   . COPD Mother   . Hypertension Father   . Other Father 52       AMI/ CVA at 40  . Stroke Father   . Colon cancer Father    Allergies: Allergies  Allergen Reactions  . Biotin Other (See Comments)    Other reaction(s): Other Taste metallic Taste metallic   . Biaxin [Clarithromycin]     "gives me lockjaw"  . Concerta [Methylphenidate] Other (See Comments)    Says affected her sleep, made her jittery and would suck in her lips  . Penicillins     "amoxicillin doesn't do anything for me anymore so I don't want it'    Medications: See med rec.  Review of Systems:  No fevers, chills, night sweats, weight loss, chest pain, or shortness of breath.   Objective:    General: Well Developed, well nourished, and in no acute distress.  Neuro: Alert and oriented x3, extra-ocular muscles intact, sensation grossly intact.  HEENT: Normocephalic, atraumatic, pupils equal round reactive to light, neck supple, no masses, no lymphadenopathy, thyroid nonpalpable.  Skin: Warm and dry, no rashes. Cardiac: Regular rate and rhythm, no murmurs rubs or gallops, no lower extremity edema.  Respiratory: Clear to auscultation bilaterally. Not using accessory muscles, speaking in full sentences. Left shoulder: Inspection reveals no abnormalities, atrophy or asymmetry. Palpation is normal with no tenderness over AC joint or bicipital groove. 10 degrees of external rotation lag, minimal pain. Rotator cuff strength normal throughout. No signs  of impingement with negative Neer and Hawkin's tests, empty can. Speeds and Yergason's tests normal. No labral pathology noted with negative Obrien's, negative crank, negative clunk, and good stability. Normal scapular function observed. No painful arc and no drop arm sign. No apprehension sign  Procedure: Real-time Ultrasound Guided Injection of left glenohumeral joint Device: GE Logiq E  Verbal informed consent obtained.  Time-out conducted.  Noted no overlying erythema, induration, or other signs of local infection.  Skin prepped in a sterile fashion.  Local anesthesia: Topical Ethyl chloride.  With sterile technique and under real time ultrasound guidance: Using a 22-gauge spinal needle advanced into the glenohumeral joint and injected 1 cc Kenalog 40, 2 cc lidocaine, 2 cc bupivacaine. Completed without difficulty  Pain immediately resolved suggesting accurate placement of the medication.  Advised to call if fevers/chills, erythema, induration, drainage, or persistent bleeding.  Images permanently stored and available for review in the ultrasound unit.  Impression: Technically successful ultrasound guided injection.  Impression and Recommendations:    Adhesive capsulitis of left shoulder Previous injection was 3-1/2 months ago, repeat left glenohumeral injection. Return as needed. ___________________________________________ Gwen Her. Dianah Field, M.D., ABFM., CAQSM. Primary Care and Sports Medicine [facility]  Adjunct Professor of [employer]

## 2018-01-21 ENCOUNTER — Ambulatory Visit: Payer: Self-pay | Admitting: Family Medicine

## 2018-02-18 ENCOUNTER — Telehealth: Payer: Self-pay | Admitting: Family Medicine

## 2018-02-18 DIAGNOSIS — M818 Other osteoporosis without current pathological fracture: Secondary | ICD-10-CM

## 2018-02-18 NOTE — Telephone Encounter (Signed)
Dexa Scan    Comments:  I will be moving to [place] on 02/28/2018 and have only this limited amount of time available  Pt sent the above message via mychart.  FYI:  I  put Dr Jerilynn Mages back in as her Provider for the purpose of sending this telephone note

## 2018-02-18 NOTE — Telephone Encounter (Signed)
What does this mean?  Is she asking for a DEXA?

## 2018-02-18 NOTE — Telephone Encounter (Signed)
Shirley Moody received a notification from MyChart to schedule a Dexa Scan. Dexa ordered. She does have osteoporosis and the last Dexa was in 2017.

## 2018-02-18 NOTE — Telephone Encounter (Signed)
Yes, she's asking for a DEXA.

## 2018-02-25 ENCOUNTER — Encounter: Payer: Self-pay | Admitting: Obstetrics & Gynecology

## 2018-02-25 ENCOUNTER — Ambulatory Visit (INDEPENDENT_AMBULATORY_CARE_PROVIDER_SITE_OTHER): Payer: BC Managed Care – PPO | Admitting: Obstetrics & Gynecology

## 2018-02-25 VITALS — BP 111/73 | HR 66 | Ht 64.0 in | Wt 135.0 lb

## 2018-02-25 DIAGNOSIS — Z1151 Encounter for screening for human papillomavirus (HPV): Secondary | ICD-10-CM

## 2018-02-25 DIAGNOSIS — R102 Pelvic and perineal pain: Secondary | ICD-10-CM

## 2018-02-25 DIAGNOSIS — Z01419 Encounter for gynecological examination (general) (routine) without abnormal findings: Secondary | ICD-10-CM | POA: Diagnosis not present

## 2018-02-25 DIAGNOSIS — Z124 Encounter for screening for malignant neoplasm of cervix: Secondary | ICD-10-CM | POA: Diagnosis not present

## 2018-02-25 DIAGNOSIS — Z01411 Encounter for gynecological examination (general) (routine) with abnormal findings: Secondary | ICD-10-CM

## 2018-02-25 MED ORDER — ESTRADIOL 10 MCG VA TABS
ORAL_TABLET | VAGINAL | 12 refills | Status: AC
Start: 2018-02-25 — End: ?

## 2018-02-25 NOTE — Patient Instructions (Signed)
Vulvar Pain  Vulvar pain is long-lasting (chronic) pain in the external female genitalia (vulva). This condition may also be called vulvodynia. The vulva includes tissues surrounding the vaginal opening and the urethra. Vulvar pain often has no known cause. There are two main types of vulvar pain:  Localized vulvar pain. This is pain that affects a specific area of the vulva. ? Vulvar vestibulitis syndrome (VVS) is a type of localized vulvar pain in which pain is only felt around the opening (vestibule) of the vagina.  Generalized vulvar pain. This is pain that affects: ? The entire vulva. ? Multiple areas of the vulva. ? One side of the vulva. What are the causes? The cause of this condition is often not known. Many factors may contribute to it. Vulvar pain may be a type of nerve pain (neuropathic pain). What increases the risk? You are more likely to develop this condition if you have:  Pelvic floor dysfunction. This is a problem in the muscles of your pelvis. These muscles support the bladder, uterus, rectum, and prostate.  A disorder that affects a protein (interleukin 1 beta receptor antagonist) that is involved in inflammation in the body.  An increased number of pain receptor nerves throughout the body.  Painful bladder.  Irritable bowel syndrome (IBS).  Fibromyalgia. This is a condition that causes chronic pain in many parts of the body.  Restless sleep.  Depression. What are the signs or symptoms? The main symptom of this condition is pain that affects part of the vulva or the entire vulva. Pain may:  Feel like a burning, tearing, stinging, or sharp, knife-like sensation.  Be chronic and ongoing.  Come and go (be intermittent).  Get worse when touching or putting pressure on the vulva, such as when inserting a tampon, engaging in sexual intercourse, sitting for a long time, or wearing tight pants. Symptoms may also include:  Swelling or redness of the vulva,  perineum, or inner thighs.  Psychological or emotional distress. How is this diagnosed? This condition may be diagnosed based on:  Your symptoms and your medical history. Your health care provider may ask questions about what causes or worsens your pain.  A physical exam of your abdomen and pelvis. This may include a cotton swab test, in which your health care provider gently touches all areas of your vulva with a cotton swab to determine where you have pain.  Tests of your vaginal fluid. These tests may be done to check for infection. How is this treated? Treatment for this condition depends on the cause and severity of your pain. Treatment may include:  Caring for your skin in the vulvar area.  Working with a health care provider who specializes in pain.  Physical therapy. This may include doing pelvic floor exercises.  Counseling (psychotherapy). This may include: ? Biofeedback. This is a type of therapy that helps you be more aware of your body's response to pain. It also helps you learn to deal with pain.  Complementary medical therapies to help relieve pain, such as: ? Hypnotherapy. This means being put in a state of altered consciousness (hypnosis). ? Acupuncture. This is the insertion of needles into certain places on the skin.  Surgery to remove affected parts of your vulva. Follow these instructions at home: Clothing  Wear cotton underwear.  Avoid wearing tight pants or pantyhose.  Wash clothing in non-scented, mild laundry detergent. Do not use fabric softeners or dryer sheets. Eating and drinking  Avoid caffeine.  Avoid foods that   are highly processed or high in sugar. Self care  If any soaps, lotions, creams, or ointments cause or worsen your pain, do not use these products on the affected areas.  Do not use feminine wipes or douches.  Do not use scented body soaps, scented pads, or scented tampons.  Do not use hot tubs or take hot baths.  Clean your  vulvar skin with warm water only and pat the area dry. General instructions  Take over-the-counter and prescription medicines only as told by your health care provider.  Consider using a non-scented silicone-based or oil-based lubricant during sexual intercourse.  Keep all follow-up visits as told by your health care provider. This is important. Contact a health care provider if:  Your pain gets worse or does not improve after starting treatment.  You develop vaginal discharge that smells bad. Summary  Vulvar pain is long-lasting (chronic) pain in the external female genitalia (vulva). This condition may also be called vulvodynia. The vulva includes tissues surrounding the vaginal opening and the urethra.  Symptoms of this condition may include swelling or redness of the vulva, perineum, or inner thighs.  The cause of this condition is often not known. Many factors may contribute to it. Vulvar pain may be a type of nerve pain (neuropathic pain).  Treatment depends on the cause and severity of your pain. It may include working with a pain specialist, physical therapy, counseling, complementary medical therapies, or surgery to remove affected parts of your vulva. This information is not intended to replace advice given to you by your health care provider. Make sure you discuss any questions you have with your health care provider. Document Released: 04/26/2015 Document Revised: 01/25/2017 Document Reviewed: 01/25/2017 Elsevier Interactive Patient Education  2019 [employer] Inc. Atrophic Vaginitis  Atrophic vaginitis is a condition in which the tissues that line the vagina become dry and thin. This condition is most common in women who have stopped having regular menstrual periods (are in menopause). This usually starts when a woman is 44-69 years old. That is the time when a woman's estrogen levels begin to drop (decrease). Estrogen is a female hormone. It helps to keep the tissues of the  vagina moist. It stimulates the vagina to produce a clear fluid that lubricates the vagina for sexual intercourse. This fluid also protects the vagina from infection. Lack of estrogen can cause the lining of the vagina to get thinner and dryer. The vagina may also shrink in size. It may become less elastic. Atrophic vaginitis tends to get worse over time as a woman's estrogen level drops. What are the causes? This condition is caused by the normal drop in estrogen that happens around the time of menopause. What increases the risk? Certain conditions or situations may lower a woman's estrogen level, leading to a higher risk for atrophic vaginitis. You are more likely to develop this condition if:  You are taking medicines that block estrogen.  You have had your ovaries removed.  You are being treated for cancer with X-ray (radiation) or medicines (chemotherapy).  You have given birth or are breastfeeding.  You are older than age 54.  You smoke. What are the signs or symptoms? Symptoms of this condition include:  Pain, soreness, or bleeding during sexual intercourse (dyspareunia).  Vaginal burning, irritation, or itching.  Pain or bleeding when a speculum is used in a vaginal exam (pelvic exam).  Having burning pain when passing urine.  Vaginal discharge that is brown or yellow. In some cases, there  are no symptoms. How is this diagnosed? This condition is diagnosed by taking a medical history and doing a physical exam. This will include a pelvic exam that checks the vaginal tissues. Though rare, you may also have other tests, including:  A urine test.  A test that checks the acid balance in your vagina (acid balance test). How is this treated? Treatment for this condition depends on how severe your symptoms are. Treatment may include:  Using an over-the-counter vaginal lubricant before sex.  Using a long-acting vaginal moisturizer.  Using low-dose vaginal estrogen for  moderate to severe symptoms that do not respond to other treatments. Options include creams, tablets, and inserts (vaginal rings). Before you use a vaginal estrogen, tell your health care provider if you have a history of: ? Breast cancer. ? Endometrial cancer. ? Blood clots. If you are not sexually active and your symptoms are very mild, you may not need treatment. Follow these instructions at home: Medicines  Take over-the-counter and prescription medicines only as told by your health care provider. Do not use herbal or alternative medicines unless your health care provider says that you can.  Use over-the-counter creams, lubricants, or moisturizers for dryness only as directed by your health care provider. General instructions  If your atrophic vaginitis is caused by menopause, discuss all of your menopause symptoms and treatment options with your health care provider.  Do not douche.  Do not use products that can make your vagina dry. These include: ? Scented feminine sprays. ? Scented tampons. ? Scented soaps.  Vaginal intercourse can help to improve blood flow and elasticity of vaginal tissue. If it hurts to have sex, try using a lubricant or moisturizer just before having intercourse. Contact a health care provider if:  Your discharge looks different than normal.  Your vagina has an unusual smell.  You have new symptoms.  Your symptoms do not improve with treatment.  Your symptoms get worse. Summary  Atrophic vaginitis is a condition in which the tissues that line the vagina become dry and thin. It is most common in women who have stopped having regular menstrual periods (are in menopause).  Treatment options include using vaginal lubricants and low-dose vaginal estrogen.  Contact a health care provider if your vagina has an unusual smell, or if your symptoms get worse or do not improve after treatment. This information is not intended to replace advice given to you by  your health care provider. Make sure you discuss any questions you have with your health care provider. Document Released: 05/19/2014 Document Revised: 09/28/2016 Document Reviewed: 09/28/2016 Elsevier Interactive Patient Education  2019 [employer].

## 2018-02-25 NOTE — Progress Notes (Signed)
Last pap- 02/01/15- negative Last mammogram- 05/22/17- negative

## 2018-02-25 NOTE — Progress Notes (Signed)
Subjective:     Shirley Moody is a 58 y.o. female here for a routine exam.  Current complaints: decreased libido and pain with intercourse.  Patient is moving to [place].  Her partner, Di Kindle, except that her new job.  Patient is doing much better than in 2018 I last saw her.  Her depression is better.  Patient stopped taking Vagifem.  She has never been to physical therapy patient also thinks she broke her left rib recently.  Patient declines x-ray at this time and will follow-up with her primary care provider if necessary.   Gynecologic History Patient's last menstrual period was 04/05/2011. Contraception: post menopausal status Last Pap: 2017. Results were: normal Last mammogram: 2019. Results were: normal  Obstetric History OB History  Gravida Para Term Preterm AB Living  1 0     1    SAB TAB Ectopic Multiple Live Births               # Outcome Date GA Lbr Len/2nd Weight Sex Delivery Anes PTL Lv  1 AB              The following portions of the patient's history were reviewed and updated as appropriate: allergies, current medications, past family history, past medical history, past social history, past surgical history and problem list.  Review of Systems Pertinent items noted in HPI and remainder of comprehensive ROS otherwise negative.    Objective:      Vitals:   02/25/18 1315  BP: 111/73  Pulse: 66  Weight: 135 lb (61.2 kg)  Height: 5\' 4"  (1.626 m)   Vitals:  WNL General appearance: alert, cooperative and no distress  HEENT: Normocephalic, without obvious abnormality, atraumatic Eyes: negative Throat: lips, mucosa, and tongue normal; teeth and gums normal  Respiratory: Clear to auscultation bilaterally  CV: Regular rate and rhythm  Breasts:  Normal appearance, no masses or tenderness, no nipple retraction or dimpling  GI: Soft, non-tender; bowel sounds normal; no masses,  no organomegaly  GU: External Genitalia:  Tanner V, no lesion Urethra:  No prolapse    Vagina: Atrophic, pain with speculum exam; increased tone at introitus  Cervix: Atrophic, os small  Uterus:  Normal size and contour, non tender  Adnexa: Normal, no masses, non tender  Musculoskeletal: No edema, redness or tenderness in the calves or thighs  Skin: No lesions or rash  Lymphatic: Axillary adenopathy: none     Psychiatric: Normal mood and behavior        Assessment:    Healthy female exam.    Plan:    Pap with cotesting Yearly mammograms Patient due for colonoscopy and will follow-up. Vagifem Pelvic physical therapy

## 2018-02-26 ENCOUNTER — Ambulatory Visit: Payer: BC Managed Care – PPO | Attending: Obstetrics & Gynecology | Admitting: Physical Therapy

## 2018-02-26 ENCOUNTER — Encounter: Payer: Self-pay | Admitting: Physical Therapy

## 2018-02-26 DIAGNOSIS — R252 Cramp and spasm: Secondary | ICD-10-CM | POA: Diagnosis present

## 2018-02-26 NOTE — Therapy (Signed)
[facility] [address], [place], [place], [address] Phone: [phone]   Fax:  [phone]  Physical Therapy Evaluation/Discharge  Patient Details  Name: Shirley Moody MRN: [id number] Date of Birth: 03-Jul-1960 Referring Provider (PT): Dr. Silas Sacramento   Encounter Date: 02/26/2018  PT End of Session - 02/26/18 1514    Visit Number  1    Date for PT Re-Evaluation  02/26/18    PT Start Time  1445    PT Stop Time  1520    PT Time Calculation (min)  35 min    Activity Tolerance  Patient tolerated treatment well    Behavior During Therapy  [facility] for tasks assessed/performed       Past Medical History:  Diagnosis Date  . Allergy   . Colon polyp   . Depression   . Diverticulitis   . IBS (irritable bowel syndrome)   . Low back pain 07/24/2017  . Osteoporosis 11/17/2015   DEXA 2017    Past Surgical History:  Procedure Laterality Date  . DILATION AND CURETTAGE OF UTERUS    . NEUROMA SURGERY     Right foot    There were no vitals filed for this visit.   Subjective Assessment - 02/26/18 1447    Subjective  I am leaving the state this friday. Dr. Gala Romney says I should come to therapy for one session.     Patient Stated Goals  educated on vaginal health    Currently in Pain?  Yes    Pain Score  10-Worst pain ever    Pain Location  Vagina    Pain Orientation  Mid    Pain Descriptors / Indicators  Sharp    Pain Type  Acute pain    Pain Onset  More than a month ago    Pain Frequency  Intermittent    Aggravating Factors   finger penetration, , vaginal exam after shower and use the bathroom    Pain Relieving Factors  no penetration    Multiple Pain Sites  No         [facility] PT Assessment - 02/26/18 0001      Assessment   Medical Diagnosis  R10.2 Vaginal pain    Referring Provider (PT)  Dr. Silas Sacramento    Onset Date/Surgical Date  02/27/16    Prior Therapy  none      Precautions   Precautions  Other  (comment)    Precaution Comments  osteoporosis      Restrictions   Weight Bearing Restrictions  No      Balance Screen   Has the patient fallen in the past 6 months  No    Has the patient had a decrease in activity level because of a fear of falling?   No    Is the patient reluctant to leave their home because of a fear of falling?   No      Home Film/video editor residence      Prior Function   Level of Independence  Independent    Vocation  Retired    Leisure  read, watch movies, color      Cognition   Overall Cognitive Status  Within Functional Limits for tasks assessed      Posture/Postural Control   Posture/Postural Control  No significant limitations      ROM / Strength   AROM / PROM / Strength  AROM;PROM;Strength  Objective measurements completed on examination: See above findings.    Pelvic Floor Special Questions - 02/26/18 0001    Currently Sexually Active  Yes    Marinoff Scale  pain interrupts completion    Urinary Leakage  No    Fecal incontinence  No    External Perineal Exam  dryness in the labia and vulva area    Skin Integrity  Intact;Erthema    Pelvic Floor Internal Exam  Patient confirms identification and approves PT to assess pelvic floor and treatment    Exam Type  Vaginal    Palpation  q-tip test with pain at 5, 11 and 1 O'clock, unable to place q-tip into the introitus               PT Education - 02/26/18 1514    Education Details  information on vaginal moisturizers, lubricants, pelvic floor meditation, pelvic stretches, dilators and use of q-tip to desentize the area    Person(s) Educated  Patient    Methods  Explanation;Demonstration;Verbal cues;Handout    Comprehension  Returned demonstration;Verbalized understanding          PT Long Term Goals - 02/26/18 1515      PT LONG TERM GOAL #1   Title  understand how to use moisturizers to the vaginal health    Time  1    Period   Days    Status  Achieved    Target Date  02/26/18      PT LONG TERM GOAL #2   Title  education on pelvic floor stretches on you tube videos    Time  1    Period  Days    Status  Achieved    Target Date  02/26/18      PT LONG TERM GOAL #3   Title  understand pelvic floor meditation and how to relax the pelvic floor    Time  1    Period  Days    Status  Achieved    Target Date  02/26/18      PT LONG TERM GOAL #4   Title  ----      PT LONG TERM GOAL #5   Title  ----             Plan - 02/26/18 1529    Clinical Impression Statement  Patient is a 58 year old female with pelvic pain at level 10/10 with penetration of the vaginal canal during intimacy and vaginal exam. Patient will have some burning in the vulva area with urination. Patient has vaginal dryness and atrophy. Q-tip test is tender at 5, 1, and 11 o'oclock. Patient pain level with the attempt to place q-tip into the vaginal canal is 5/10. Patient was not able to have a vaginal exam due to pain. Patient is moving to [place] on Friday so this visit was evaluation with treatment. Patient would benefit from seeing a pelvic floor physical therapy in [place] to work on improve tissue mobility and reduction of vaginal pain.     History and Personal Factors relevant to plan of care:  osteoporosis, IBS, depression    Clinical Presentation  Evolving    Clinical Presentation due to:  pain with vaginal penetration for intimacy and vaginal penetration    Clinical Decision Making  Moderate    Rehab Potential  Excellent    Clinical Impairments Affecting Rehab Potential  osteoporosis, IBS, depression    PT Frequency  One time visit    PT Duration  Other (  comment)   1 time visit for eval and treatment   PT Treatment/Interventions  Patient/family education;Therapeutic exercise;Neuromuscular re-education;Therapeutic activities    PT Next Visit Plan  current HEP for 1 time visit    PT Home Exercise Plan  current HEP    Consulted and  Agree with Plan of Care  Patient       Patient will benefit from skilled therapeutic intervention in order to improve the following deficits and impairments:  Pain, Increased fascial restricitons, Decreased endurance  Visit Diagnosis: Cramp and spasm - Plan: PT plan of care cert/re-cert     Problem List Patient Active Problem List   Diagnosis Date Noted  . Adhesive capsulitis of left shoulder 09/04/2017  . Low back pain 07/24/2017  . History of colonic polyps 07/04/2017  . Gastroesophageal reflux disease without esophagitis 06/29/2017  . DDD (degenerative disc disease), cervical 03/28/2017  . Scapulalgia 03/01/2017  . Neck pain 03/01/2017  . Anxiety 03/01/2017  . Rotator cuff tendonitis, left 12/14/2016  . Medial epicondylitis 11/02/2016  . Hallux rigidus of right foot 11/02/2016  . Bunionette of right foot 11/02/2016  . Systemic lupus erythematosus (SLE) in adult ([place]) 07/11/2016  . Urinary stream splitting 03/20/2016  . Osteoporosis 11/17/2015  . Postmenopausal atrophic vaginitis 02/01/2015  . Vulvar lesion 02/01/2015  . Primary osteoarthritis of left knee 06/26/2014  . Impingement syndrome of right shoulder 03/11/2013  . 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 Pain at left costal margin. 11/15/2011  . ADHD (attention deficit hyperactivity disorder) 12/05/2010  . GENERALIZED ANXIETY DISORDER 02/16/2010  . PAP SMEAR, LGSIL, ABNORMAL 02/16/2010  . DIVERTICULITIS, HX OF 01/22/2010  . ALLERGIC RHINITIS 11/16/2009  . COLONIC POLYPS, HYPERPLASTIC 01/27/2009  . DEPRESSION 02/01/2008    Earlie Counts, PT 02/26/18 3:37 PM    [facility] [address] [place], [place], [address] Phone: [phone]   Fax:  [phone]  Name: Selia Wareing MRN: [id number] Date of Birth: [birth year] PHYSICAL THERAPY DISCHARGE SUMMARY  Visits from Start of Care: 1  Current functional level related to goals / functional  outcomes: See above.    Remaining deficits: See above.    Education / Equipment: HEP Plan: Patient agrees to discharge.  Patient goals were met. Patient is being discharged due to meeting the stated rehab goals. Patient should see a pelvic floor therapist in [place] to continue her treatment. Earlie Counts, PT 02/26/18 3:37 PM   ?????

## 2018-02-26 NOTE — Patient Instructions (Signed)
Moisturizers . They are used in the vagina to hydrate the mucous membrane that make up the vaginal canal. . Designed to keep a more normal acid balance (ph) . Once placed in the vagina, it will last between two to three days.  . Use 2-3 times per week at bedtime and last longer than 60 min. . Ingredients to avoid is glycerin and fragrance, can increase chance of infection . Should not be used just before sex due to causing irritation . Most are gels administered either in a tampon-shaped applicator or as a vaginal suppository. They are non-hormonal.   Types of Moisturizers . Samul Dada- drug store . Vitamin E vaginal suppositories- Whole foods, Amazon . Moist Again . Coconut oil- can break down condoms . Julva- (Do no use if on Tamoxifen) amazon . Yes moisturizer- amazon . NeuEve Silk , NeuEve Silver for menopausal or over 65 (if have severe vaginal atrophy or cancer treatments use NeuEve Silk for  1 month than move to [employer])- [employer], MapleFlower.dk . Olive and Bee intimate cream- www.oliveandbee.com.au . Mae vaginal moisturizer- Amazon  Creams to use externally on the Vulva area  Albertson's (good for for cancer patients that had radiation to the area)- [place] or [employer] Corporation.FlyingBasics.com.br  V-magic cream - amazon  Julva-amazon  Vital "V [place] salve ( help moisturize and help with thinning vulvar area, does have [place] by Damiva labial moisturizer ([place],    Things to avoid in the vaginal area . Do not use things to irritate the vulvar area . No lotions just specialized creams for the vulva area- Neogyn, V-magic, No soaps; can use Aveeno or Calendula cleanser if needed. Must be gentle . No deodorants . No douches . Good to sleep without underwear to let the vaginal area to air out . No scrubbing: spread the lips to let warm water rinse over labias and pat dry  Lubrication . Used  for intercourse to reduce friction . Avoid ones that have glycerin, warming gels, tingling gels, icing or cooling gel, scented . Avoid parabens due to a preservative similar to female sex hormone . May need to be reapplied once or several times during sexual activity . Can be applied to both partners genitals prior to vaginal penetration to minimize friction or irritation . Prevent irritation and mucosal tears that cause post coital pain and increased the risk of vaginal and urinary tract infections . Oil-based lubricants cannot be used with condoms due to breaking them down.  Least likely to irritate vaginal tissue.  . Plant based-lubes are safe . Silicone-based lubrication are thicker and last long and used for post-menopausal women  Vaginal Lubricators Here is a list of some suggested lubricators you can use for intercourse. Use the most hypoallergenic product.  You can place on you or your partner.   Slippery Stuff  Sylk or Sliquid Natural H2O ( good  if frequent UTI's)  Blossom Organics (www.blossom-organics.com)  Luvena   Coconut oil  PJur Woman Nude- water based lubricant, amazon  Uberlube- Amazon  Aloe Vera  Yes lubricant- [employer] Platinum-Silicone, Target, Walgreens  Olive and Bee intimate cream-  www.oliveandbee.com.au  Pink - Amazon Things to avoid in lubricants are glycerin, warming gels, tingling gels, icing or cooling  gels, and scented gels.  Also avoid Vaseline. KY jelly, Replens, and Astroglide kills good bacteria(lactobacilli)  Things to avoid in the vaginal area . Do not use things  to irritate the vulvar area . No lotions- see below . No soaps; can use Aveeno or Calendula cleanser if needed. Must be gentle . No deodorants . No douches . Good to sleep without underwear to let the vaginal area to air out . No scrubbing: spread the lips to let warm water rinse over labias and pat dry  Creams that can be used on the [place] Releveum or [place]vest Gele     STRETCHING THE PELVIC FLOOR MUSCLES NO DILATOR  Supplies . Vaginal lubricant . Mirror (optional) . Gloves (optional) Positioning . Start in a semi-reclined position with your head propped up. Bend your knees and place your thumb or finger at the vaginal opening. Procedure . Apply a moderate amount of lubricant on the outer skin of your vagina, the labia minora.  Apply additional lubricant to your finger. Marland Kitchen Spread the skin away from the vaginal opening. Place the end of your finger at the opening. . Do a maximum contraction of the pelvic floor muscles. Tighten the vagina and the anus maximally and relax. . When you know they are relaxed, gently and slowly insert your finger into your vagina, directing your finger slightly downward, for 2-3 inches of insertion. . Relax and stretch the 6 o'clock position . Hold each stretch for _2 min__ and repeat __1_ time with rest breaks of _1__ seconds between each stretch. . Repeat the stretching in the 4 o'clock and 8 o'clock positions. . Total time should be _6__ minutes, _1__ x per day.  Note the amount of theme your were able to achieve and your tolerance to your finger in your vagina. . Once you have accomplished the techniques you may try them in standing with one foot resting on the tub, or in other positions.  This is a good stretch to do in the shower if you don't need to use lubricant.    The "Pelvic Drop" to Release Pelvic Floor Tension: Three Visualizations     Guided Meditation for Pelvic Floor Relaxation  FemFusion Fitness   Pelvic Floor Release Stretches  FemFusion Fitness    Pelvic Floor Release Stretches (NEW)  FemFusion Fitness  [email or web address]

## 2018-02-27 ENCOUNTER — Ambulatory Visit (INDEPENDENT_AMBULATORY_CARE_PROVIDER_SITE_OTHER): Payer: BC Managed Care – PPO

## 2018-02-27 DIAGNOSIS — M810 Age-related osteoporosis without current pathological fracture: Secondary | ICD-10-CM

## 2018-02-27 DIAGNOSIS — M818 Other osteoporosis without current pathological fracture: Secondary | ICD-10-CM

## 2018-02-27 LAB — CYTOLOGY - PAP
Diagnosis: NEGATIVE
HPV: NOT DETECTED

## 2018-04-15 IMAGING — DX DG CERVICAL SPINE COMPLETE 4+V
5 series · 5 of 5 positions shown · non-contrast
Comparison: Cervical spine plain films of 11/04/2015

CLINICAL DATA: Chronic neck pain

EXAM:
CERVICAL SPINE - COMPLETE 4+ VIEW

[c-spine lat]
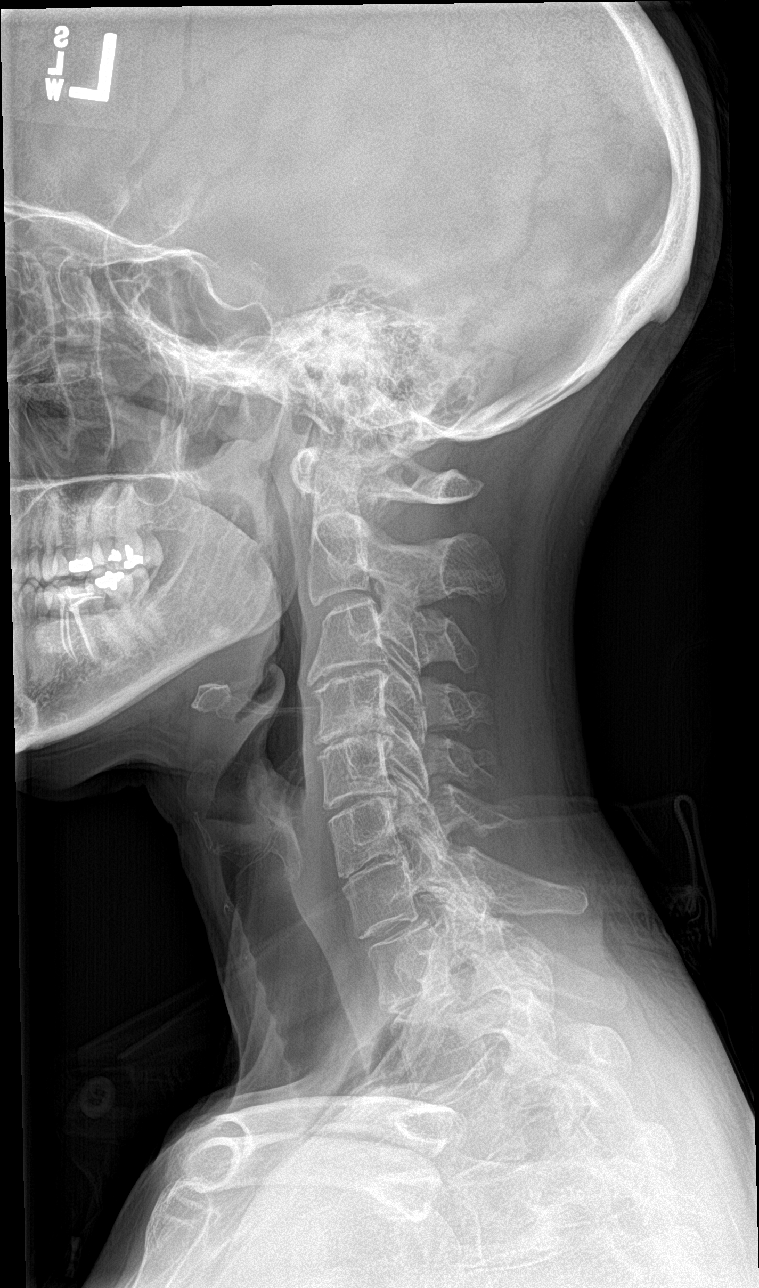

[c-spine obl (1 of 2)]
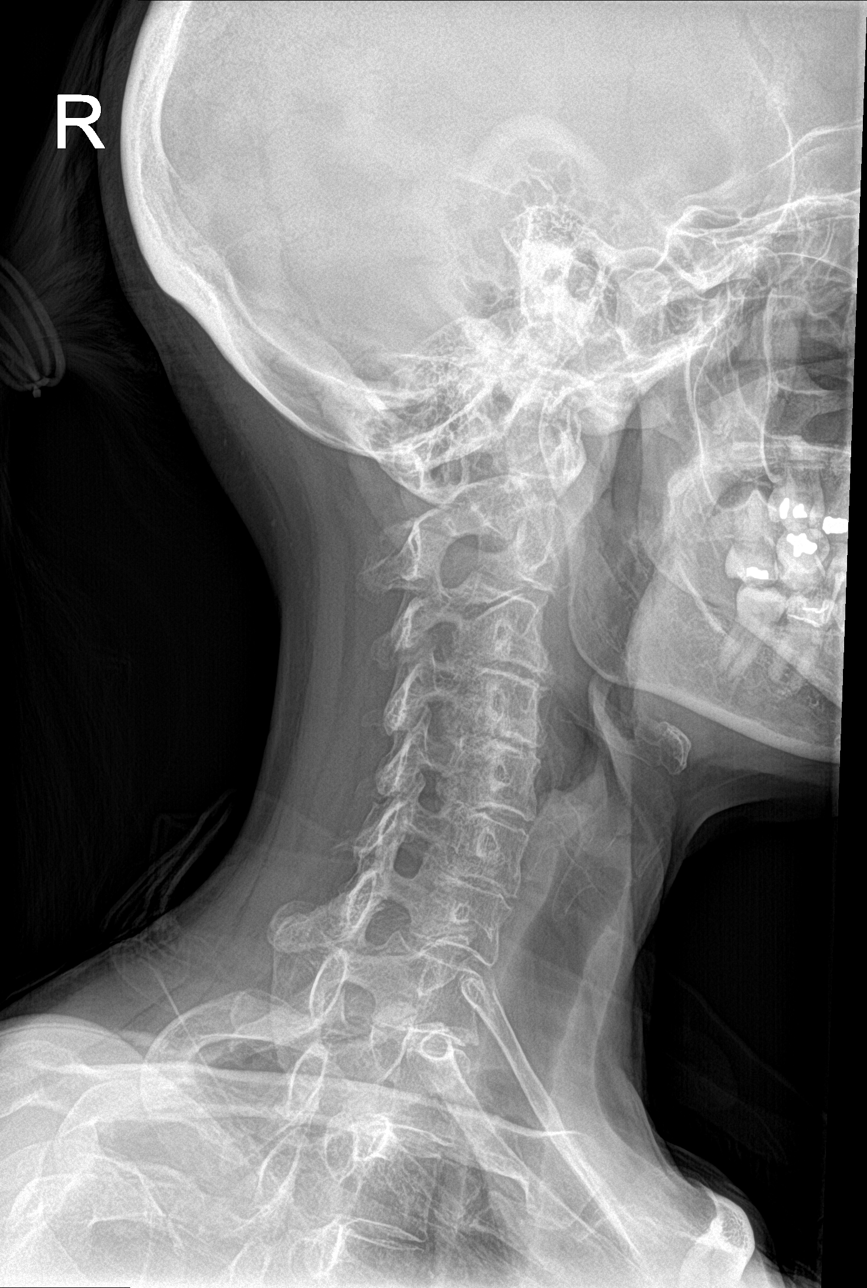

[c-spine obl (2 of 2)]
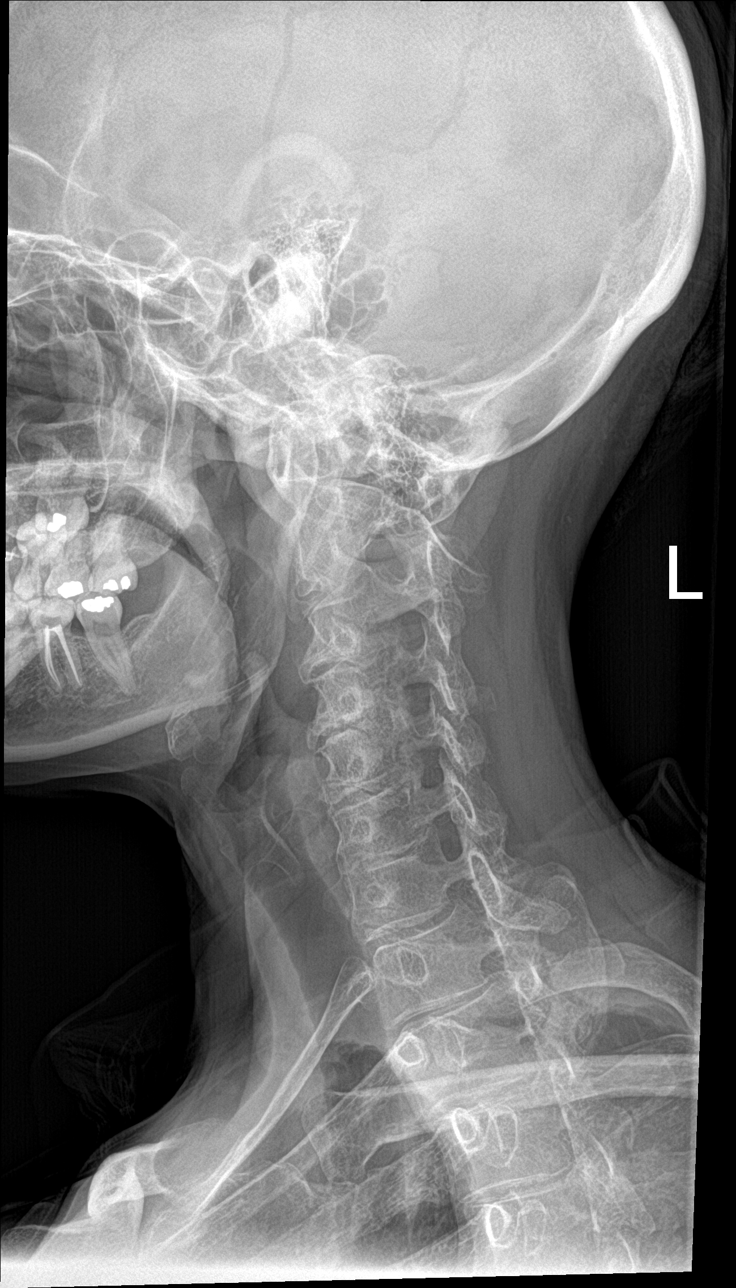

[c-spine ap]
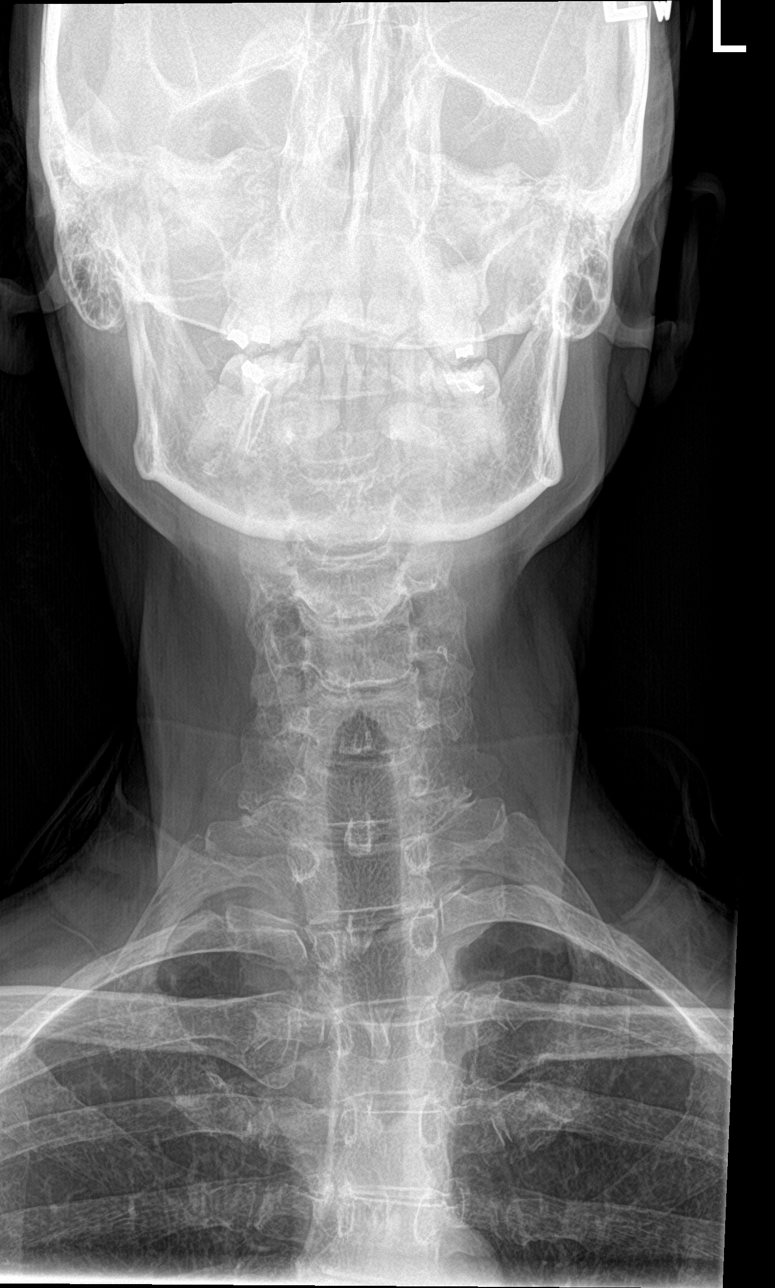

[c-spine open mouth]
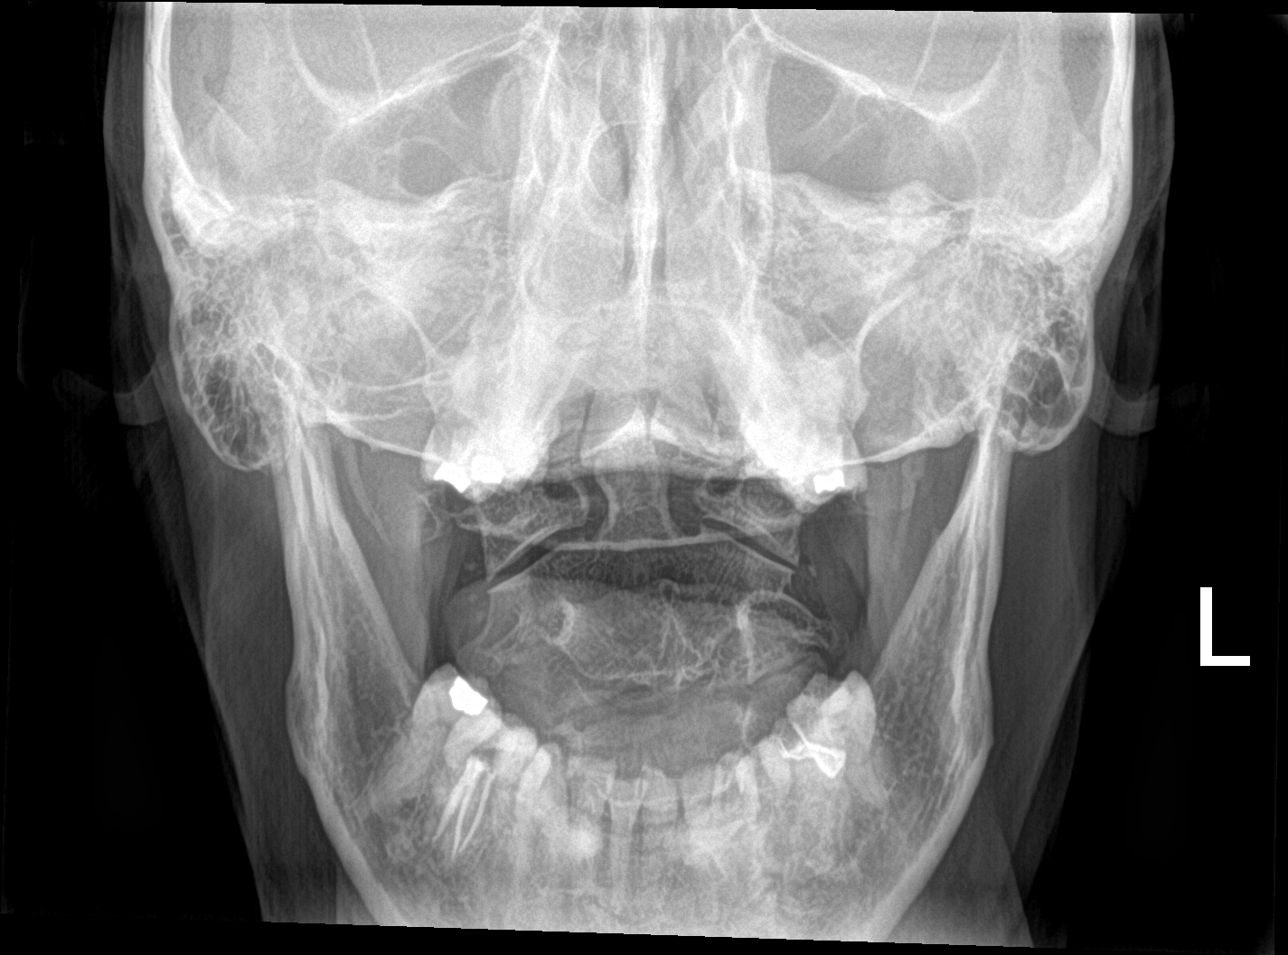

[5 of 5 positions shown; findings below may reference images not displayed]

FINDINGS: The cervical vertebrae remain somewhat straightened in alignment.
Degenerative disc disease is noted at C3-4, C4-5, C5-6, and C6-7
levels with loss of disc space and sclerosis with spurring. This
appearance has progressed somewhat since the prior images from 5723.
No prevertebral soft tissue swelling is seen. On oblique views,
there is foraminal narrowing at the above levels right greater than
left. The odontoid process is intact. The lung apices are clear.
IMPRESSION: Diffuse degenerative disc disease from C3-4 to C6-7 with foraminal
narrowing at these levels as well. Straightened alignment.

## 2018-05-29 ENCOUNTER — Ambulatory Visit: Payer: BC Managed Care – PPO | Admitting: Sports Medicine

## 2018-05-29 ENCOUNTER — Encounter: Payer: Self-pay | Admitting: Sports Medicine

## 2018-05-29 DIAGNOSIS — M7502 Adhesive capsulitis of left shoulder: Secondary | ICD-10-CM

## 2018-05-29 NOTE — Assessment & Plan Note (Signed)
Repeat glenohumeral injection, the previous injection was in December 2019. Doximity visit for follow-up in 1 month. Aggressive shoulder rehabilitation exercises given.

## 2018-05-29 NOTE — Progress Notes (Signed)
Subjective:    CC: Left shoulder pain  HPI: This is a very pleasant 58 year old female with left shoulder osteoarthritis and intermittent symptoms of adhesive capsulitis.  We last did a glenohumeral injection in December 2019, she did extremely well but then slacked off of her rehabilitation exercises, she started to have a recurrence of pain over the past couple of weeks, moderate, persistent, localized at the joint line and over the distal deltoid without radiation.  No trauma, no mechanical symptoms.  [place]  I reviewed the past medical history, family history, social history, surgical history, and allergies today and no changes were needed.  Please see the problem list section below in epic for further details.  Past Medical History: Past Medical History:  Diagnosis Date  . Allergy   . Colon polyp   . Depression   . Diverticulitis   . IBS (irritable bowel syndrome)   . Low back pain 07/24/2017  . Osteoporosis 11/17/2015   DEXA 2017   Past Surgical History: Past Surgical History:  Procedure Laterality Date  . DILATION AND CURETTAGE OF UTERUS    . NEUROMA SURGERY     Right foot   Social History: Social History   Socioeconomic History  . Marital status: Single    Spouse name: Not on file  . Number of children: Not on file  . Years of education: Not on file  . Highest education level: Not on file  Occupational History  . 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 Financial resource strain: Not on file  . Food insecurity:    Worry: Not on file    Inability: Not on file  . Transportation needs:    Medical: Not on file    Non-medical: Not on file  Tobacco Use  . Smoking status: Former Research scientist (life sciences)  . Smokeless tobacco: Never Used  Substance and Sexual Activity  . Alcohol use: Yes    Alcohol/week: 0.0 standard drinks    Comment: 1 drink/month  . Drug use: No  . Sexual  activity: Yes    Partners: Male    Birth control/protection: Post-menopausal  Lifestyle  . Physical activity:    Days per week: Not on file    Minutes per session: Not on file  . Stress: Not on file  Relationships  . Social connections:    Talks on phone: Not on file    Gets together: Not on file    Attends religious service: Not on file    Active member of club or organization: Not on file    Attends meetings of clubs or organizations: Not on file    Relationship status: Not on file  Other Topics Concern  . Not on file  Social History Narrative  . Not on file   Family History: Family History  Problem Relation Age of Onset  . Alcohol abuse Mother   . COPD Mother   . Hypertension Father   . Other Father 65       AMI/ CVA at 48  . Stroke Father   . Colon cancer Father    Allergies: Allergies  Allergen Reactions  . Biotin Other (See Comments)    Other reaction(s): Other Taste metallic Taste metallic   . Biaxin [Clarithromycin]     "gives me lockjaw"  . Concerta [Methylphenidate] Other (See Comments)    Says affected her sleep, made her jittery and would  suck in her lips  . Penicillins     "amoxicillin doesn't do anything for me anymore so I don't want it'    Medications: See med rec.  Review of Systems: No fevers, chills, night sweats, weight loss, chest pain, or shortness of breath.   Objective:    General: Well Developed, well nourished, and in no acute distress.  Neuro: Alert and oriented x3, extra-ocular muscles intact, sensation grossly intact.  HEENT: Normocephalic, atraumatic, pupils equal round reactive to light, neck supple, no masses, no lymphadenopathy, thyroid nonpalpable.  Skin: Warm and dry, no rashes. Cardiac: Regular rate and rhythm, no murmurs rubs or gallops, no lower extremity edema.  Respiratory: Clear to auscultation bilaterally. Not using accessory muscles, speaking in full sentences.  Procedure: Real-time Ultrasound Guided injection of  the left glenohumeral joint Device: GE Logiq E  Verbal informed consent obtained.  Time-out conducted.  Noted no overlying erythema, induration, or other signs of local infection.  Skin prepped in a sterile fashion.  Local anesthesia: Topical Ethyl chloride.  With sterile technique and under real time ultrasound guidance:  22-gauge spinal needle advanced into the glenohumeral joint from a posterior approach, I then injected 1 cc Kenalog 40, 2 cc lidocaine, 2 cc bupivacaine. Completed without difficulty  Pain immediately resolved suggesting accurate placement of the medication.  Advised to call if fevers/chills, erythema, induration, drainage, or persistent bleeding.  Images permanently stored and available for review in the ultrasound unit.  Impression: Technically successful ultrasound guided injection.  Impression and Recommendations:    Adhesive capsulitis of left shoulder Repeat glenohumeral injection, the previous injection was in December 2019. Doximity visit for follow-up in 1 month. Aggressive shoulder rehabilitation exercises given.   ___________________________________________ Gwen Her. Dianah Field, M.D., ABFM., CAQSM. Primary Care and Sports Medicine [facility]  Adjunct Professor of [employer] [employer]

## 2018-05-31 ENCOUNTER — Encounter: Payer: Self-pay | Admitting: Family Medicine

## 2018-06-24 ENCOUNTER — Encounter: Payer: Self-pay | Admitting: Sports Medicine

## 2018-06-26 ENCOUNTER — Ambulatory Visit: Payer: BC Managed Care – PPO | Admitting: Sports Medicine

## 2018-10-09 ENCOUNTER — Other Ambulatory Visit: Payer: Self-pay | Admitting: Family Medicine

## 2018-10-09 DIAGNOSIS — Z1231 Encounter for screening mammogram for malignant neoplasm of breast: Secondary | ICD-10-CM

## 2018-11-22 ENCOUNTER — Telehealth: Payer: Self-pay

## 2018-11-22 ENCOUNTER — Ambulatory Visit
Admission: RE | Admit: 2018-11-22 | Discharge: 2018-11-22 | Disposition: A | Discharge status: Home / Self Care | Payer: BC Managed Care – PPO | Source: Ambulatory Visit | Attending: Family Medicine | Admitting: Family Medicine

## 2018-11-22 ENCOUNTER — Other Ambulatory Visit: Payer: Self-pay

## 2018-11-22 DIAGNOSIS — N6310 Unspecified lump in the right breast, unspecified quadrant: Secondary | ICD-10-CM

## 2018-11-22 DIAGNOSIS — Z1231 Encounter for screening mammogram for malignant neoplasm of breast: Secondary | ICD-10-CM

## 2018-11-22 NOTE — Telephone Encounter (Signed)
Patient called- has lump on right breast. She was due for mammogram but now needs diagnostic and ultrasound since she has a lump .   Diagnostic mammogram and ultrasound pended and ordered for outside location since she is now in [place].   Review pended labs and sign if okay, please!

## 2018-11-25 ENCOUNTER — Other Ambulatory Visit: Payer: Self-pay | Admitting: Family Medicine

## 2018-11-25 DIAGNOSIS — N63 Unspecified lump in unspecified breast: Secondary | ICD-10-CM

## 2018-11-25 NOTE — Telephone Encounter (Signed)
Orders placed.  Will need to fax to [facility].

## 2018-11-28 NOTE — Telephone Encounter (Signed)
Called patient, states she needs to come here anyway next week to see Dr Madilyn Fireman and discuss some memory concerns.  I have scheduled patient with Dr Madilyn Fireman for memory concerns and with Dr T for shoulder injection. Since patient is traveling she has requested both appointments on same day. I advised patient that she should contact her insurance and make sure there is no issue with seeing both providers on same day.   I advised patient to also go ahead and contact breast center to see if she can have mammogram and ultrasounds after her appointments here.

## 2018-12-03 ENCOUNTER — Ambulatory Visit: Payer: BC Managed Care – PPO | Admitting: Family Medicine

## 2018-12-06 ENCOUNTER — Ambulatory Visit
Admission: RE | Admit: 2018-12-06 | Discharge: 2018-12-06 | Disposition: A | Discharge status: Home / Self Care | Payer: BC Managed Care – PPO | Source: Ambulatory Visit | Attending: Family Medicine | Admitting: Family Medicine

## 2018-12-06 ENCOUNTER — Encounter: Payer: Self-pay | Admitting: Family Medicine

## 2018-12-06 ENCOUNTER — Other Ambulatory Visit: Payer: Self-pay

## 2018-12-06 ENCOUNTER — Ambulatory Visit: Payer: BC Managed Care – PPO | Admitting: Family Medicine

## 2018-12-06 ENCOUNTER — Ambulatory Visit: Payer: BC Managed Care – PPO | Admitting: Sports Medicine

## 2018-12-06 ENCOUNTER — Encounter: Payer: Self-pay | Admitting: Sports Medicine

## 2018-12-06 ENCOUNTER — Other Ambulatory Visit: Payer: Self-pay | Admitting: Family Medicine

## 2018-12-06 VITALS — BP 135/66 | HR 72 | Ht 64.0 in | Wt 134.0 lb

## 2018-12-06 DIAGNOSIS — R413 Other amnesia: Secondary | ICD-10-CM

## 2018-12-06 DIAGNOSIS — N63 Unspecified lump in unspecified breast: Secondary | ICD-10-CM

## 2018-12-06 DIAGNOSIS — M67912 Unspecified disorder of synovium and tendon, left shoulder: Secondary | ICD-10-CM

## 2018-12-06 DIAGNOSIS — F321 Major depressive disorder, single episode, moderate: Secondary | ICD-10-CM

## 2018-12-06 DIAGNOSIS — M818 Other osteoporosis without current pathological fracture: Secondary | ICD-10-CM

## 2018-12-06 MED ORDER — ALENDRONATE SODIUM 70 MG PO TABS: ORAL_TABLET | ORAL | 4 refills | Status: AC

## 2018-12-06 NOTE — Progress Notes (Addendum)
Established Patient Office Visit  Subjective:  Patient ID: Shirley Moody, female    DOB: 09/05/1960  Age: 58 y.o. MRN: [id number]  CC:  Chief Complaint  Patient presents with  . Follow-up     HPI Shirley Moody presents for Memory problems.  Says really for several years she has noticed some small changes in her memory.  Sometimes has been forgetful of parts of events such as going to a certain restaurant with a friend and remembering who that friend was.  Over the last couple years she feels like it has impacted her ability to follow through with instructions and really comprehend them.  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    She says that her partner says  that she forgets everything.  Though, she and her partner have been having some stressors in their relationship. She says more recently she has noticed especially in driving back to the local area (she now lives in [place]) that she will forget where she is going. These are places that she should be familiar with.  She is concerned because she does have a family history of Alzheimer's dementia in her grandmother.  That her grandmother was probably in her 42s when she was diagnosed and lived into her 81s.  Reports that she is also no longer exercising.  Her energy and desire to get up and go and do things has been significantly decreased.  Osteoporosis-she is like to go ahead and restart her Fosamax.  She has been off of it for quite some time.  Past Medical History:  Diagnosis Date  . Allergy   . Colon polyp   . Depression   . Diverticulitis   . IBS (irritable bowel syndrome)   . Low back pain 07/24/2017  . Osteoporosis 11/17/2015   DEXA 2017    Past Surgical History:  Procedure Laterality Date  . DILATION AND CURETTAGE OF UTERUS    . NEUROMA SURGERY     Right foot    Family History  Problem Relation Age of Onset  . Alcohol abuse Mother   . COPD Mother   . Hypertension Father   .  Other Father 55       AMI/ CVA at 39  . Stroke Father   . Colon cancer Father     Social History   Socioeconomic History  . Marital status: Single    Spouse name: Not on file  . Number of children: Not on file  . Years of education: Not on file  . Highest education level: Not on file  Occupational History  . 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 Financial resource strain: Not on file  . Food insecurity    Worry: Not on file    Inability: Not on file  . Transportation needs    Medical: Not on file    Non-medical: Not on file  Tobacco Use  . Smoking status: Former Research scientist (life sciences)  . Smokeless tobacco: Never Used  Substance and Sexual Activity  . Alcohol use: Yes    Alcohol/week: 0.0 standard drinks    Comment: 1 drink/month  . Drug use: No  . Sexual activity: Yes    Partners: Male    Birth control/protection: Post-menopausal  Lifestyle  . Physical activity    Days per week: Not on file    Minutes per session: Not on file  . Stress: Not on  file  Relationships  . Social Herbalist on phone: Not on file    Gets together: Not on file    Attends religious service: Not on file    Active member of club or organization: Not on file    Attends meetings of clubs or organizations: Not on file    Relationship status: Not on file  . Intimate partner violence    Fear of current or ex partner: Not on file    Emotionally abused: Not on file    Physically abused: Not on file    Forced sexual activity: Not on file  Other Topics Concern  . Not on file  Social History Narrative  . Not on file    Outpatient Medications Prior to Visit  Medication Sig Dispense Refill  . buPROPion (WELLBUTRIN XL) 150 MG 24 hr tablet Take 450 mg by mouth daily.     . cetirizine (ZYRTEC) 10 MG tablet Take 10 mg by mouth daily.      . Estradiol 10 MCG TABS vaginal tablet Insert 1 tablet vaginally every night for 2 weeks then 1 tablet  vaginally twice a week 30 tablet 12  . FLUoxetine (PROZAC) 10 MG capsule TK 1 C PO D  2  . gabapentin (NEURONTIN) 300 MG capsule   2  . mometasone (NASONEX) 50 MCG/ACT nasal spray 1 spray daily.    . traZODone (DESYREL) 100 MG tablet Take 100 mg by mouth at bedtime.    Marland Kitchen alendronate (FOSAMAX) 70 MG tablet TAKE 1 TABLET(70 MG) BY MOUTH 1 TIME A WEEK WITH A FULL GLASS OF WATER AND ON AN EMPTY STOMACH 12 tablet 4  . traZODone (DESYREL) 100 MG tablet      No facility-administered medications prior to visit.     Allergies  Allergen Reactions  . Biotin Other (See Comments)    Other reaction(s): Other Taste metallic Taste metallic   . Biaxin [Clarithromycin]     "gives me lockjaw"  . Concerta [Methylphenidate] Other (See Comments)    Says affected her sleep, made her jittery and would suck in her lips  . Penicillins     "amoxicillin doesn't do anything for me anymore so I don't want it'     ROS Review of Systems    Objective:    Physical Exam  Constitutional: She is oriented to person, place, and time. She appears well-developed and well-nourished.  HENT:  Head: Normocephalic and atraumatic.  Cardiovascular: Normal rate, regular rhythm and normal heart sounds.  Pulmonary/Chest: Effort normal and breath sounds normal.  Neurological: She is alert and oriented to person, place, and time.  Skin: Skin is warm and dry.  Psychiatric: She has a normal mood and affect. Her behavior is normal.    BP 135/66   Pulse 72   Ht '5\' 4"'  (1.626 m)   Wt 134 lb (60.8 kg)   LMP 04/05/2011   SpO2 99%   BMI 23.00 kg/m  Wt Readings from Last 3 Encounters:  12/06/18 134 lb (60.8 kg)  12/06/18 134 lb (60.8 kg)  05/29/18 135 lb (61.2 kg)     Health Maintenance Due  Topic Date Due  . HIV Screening  04/07/1976    There are no preventive care reminders to display for this patient.  Lab Results  Component Value Date   TSH 1.41 06/16/2016   Lab Results  Component Value Date   WBC 3.5  (L) 06/16/2016   HGB 14.5 06/16/2016   HCT 44.8 06/16/2016  MCV 96.8 06/16/2016   PLT 234 06/16/2016   Lab Results  Component Value Date   NA 141 04/09/2017   K 3.8 04/09/2017   CO2 29 04/09/2017   GLUCOSE 90 04/09/2017   BUN 10 04/09/2017   CREATININE 0.73 04/09/2017   BILITOT 0.3 04/17/2017   ALKPHOS 36 06/16/2016   AST 30 04/17/2017   ALT 36 (H) 04/17/2017   PROT 6.4 04/17/2017   ALBUMIN 4.3 06/16/2016   CALCIUM 9.3 04/09/2017   Lab Results  Component Value Date   CHOL 160 11/08/2015   Lab Results  Component Value Date   HDL 56 11/08/2015   Lab Results  Component Value Date   LDLCALC 90 11/08/2015   Lab Results  Component Value Date   TRIG 72 11/08/2015   Lab Results  Component Value Date   CHOLHDL 2.9 11/08/2015   No results found for: HGBA1C    Assessment & Plan:   Problem List Items Addressed This Visit      Musculoskeletal and Integument   Osteoporosis    Restart Fosamax.  New prescription sent to pharmacy.      Relevant Medications   alendronate (FOSAMAX) 70 MG tablet     Other   Memory change - Primary    -unclear etiology it certainly sounds like she has some emotional stressors which certainly could be contributing but she is actively working with a therapist and a psychiatrist and is on medication.  We did discuss referral to a neuropsychiatrist for further evaluation particularly since she is otherwise healthy 58 year old.  We will also do some lab work just to rule out deficiencies and thyroid abnormalities which could be contributing.  Would not quite classify this is mild cognitive impairment yet.      Relevant Orders   B12   TSH   CBC w/Diff   Sed Rate (ESR)   RPR   Vitamin B1   Ferritin   Zinc   HIV antibody (with reflex)   Ambulatory referral to Neuropsychology   MDD (major depressive disorder)    Follows with psychiatry and sees a therapist.  If all the lab work comes back reassuring recommend that she also speak with  them.  I definitely sounds like she still having some significant depression symptoms.  PHQ-9 score was 9 today and GAD-7 score of 5.  She rates her symptoms as somewhere between somewhat difficult and very difficult.  She does have thoughts occasionally of feeling better off dead but no active suicidal plans.  Continue with fluoxetine and Wellbutrin for now.      Relevant Medications   traZODone (DESYREL) 100 MG tablet      Meds ordered this encounter  Medications  . alendronate (FOSAMAX) 70 MG tablet    Sig: TAKE 1 TABLET(70 MG) BY MOUTH 1 TIME A WEEK WITH A FULL GLASS OF WATER AND ON AN EMPTY STOMACH    Dispense:  12 tablet    Refill:  4    Follow-up: Return in about 4 weeks (around 01/03/2019) for virtual follow up .    Beatrice Lecher, MD

## 2018-12-06 NOTE — Assessment & Plan Note (Signed)
Restart Fosamax.  New prescription sent to pharmacy.

## 2018-12-06 NOTE — Assessment & Plan Note (Signed)
-  unclear etiology it certainly sounds like she has some emotional stressors which certainly could be contributing but she is actively working with a therapist and a psychiatrist and is on medication.  We did discuss referral to a neuropsychiatrist for further evaluation particularly since she is otherwise healthy 58 year old.  We will also do some lab work just to rule out deficiencies and thyroid abnormalities which could be contributing.  Would not quite classify this is mild cognitive impairment yet.

## 2018-12-06 NOTE — Progress Notes (Signed)
Subjective:    CC: Left shoulder pain  HPI: This is a very pleasant 58 year old female, she has on and off shoulder pain now for several months, we initially diagnosed her with adhesive capsulitis, this resolved quickly after a glenohumeral joint injection, she then had a recurrence of pain, localized over the deltoid and worse with overhead activities, MRI subsequently showed rotator cuff tendinosis, pain is moderate, persistent, localized over the deltoid without radiation.  I reviewed the past medical history, family history, social history, surgical history, and allergies today and no changes were needed.  Please see the problem list section below in epic for further details.  Past Medical History: Past Medical History:  Diagnosis Date  . Allergy   . Colon polyp   . Depression   . Diverticulitis   . IBS (irritable bowel syndrome)   . Low back pain 07/24/2017  . Osteoporosis 11/17/2015   DEXA 2017   Past Surgical History: Past Surgical History:  Procedure Laterality Date  . DILATION AND CURETTAGE OF UTERUS    . NEUROMA SURGERY     Right foot   Social History: Social History   Socioeconomic History  . Marital status: Single    Spouse name: Not on file  . Number of children: Not on file  . Years of education: Not on file  . Highest education level: Not on file  Occupational History  . 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 Financial resource strain: Not on file  . Food insecurity    Worry: Not on file    Inability: Not on file  . Transportation needs    Medical: Not on file    Non-medical: Not on file  Tobacco Use  . Smoking status: Former Research scientist (life sciences)  . Smokeless tobacco: Never Used  Substance and Sexual Activity  . Alcohol use: Yes    Alcohol/week: 0.0 standard drinks    Comment: 1 drink/month  . Drug use: No  . Sexual activity: Yes    Partners: Male    Birth control/protection: Post-menopausal   Lifestyle  . Physical activity    Days per week: Not on file    Minutes per session: Not on file  . Stress: Not on file  Relationships  . Social Herbalist on phone: Not on file    Gets together: Not on file    Attends religious service: Not on file    Active member of club or organization: Not on file    Attends meetings of clubs or organizations: Not on file    Relationship status: Not on file  Other Topics Concern  . Not on file  Social History Narrative  . Not on file   Family History: Family History  Problem Relation Age of Onset  . Alcohol abuse Mother   . COPD Mother   . Hypertension Father   . Other Father 91       AMI/ CVA at 82  . Stroke Father   . Colon cancer Father    Allergies: Allergies  Allergen Reactions  . Biotin Other (See Comments)    Other reaction(s): Other Taste metallic Taste metallic   . Biaxin [Clarithromycin]     "gives me lockjaw"  . Concerta [Methylphenidate] Other (See Comments)    Says affected her sleep, made her jittery and would suck in her lips  . Penicillins     "amoxicillin doesn't do anything for me anymore  so I don't want it'    Medications: See med rec.  Review of Systems: No fevers, chills, night sweats, weight loss, chest pain, or shortness of breath.   Objective:    General: Well Developed, well nourished, and in no acute distress.  Neuro: Alert and oriented x3, extra-ocular muscles intact, sensation grossly intact.  HEENT: Normocephalic, atraumatic, pupils equal round reactive to light, neck supple, no masses, no lymphadenopathy, thyroid nonpalpable.  Skin: Warm and dry, no rashes. Cardiac: Regular rate and rhythm, no murmurs rubs or gallops, no lower extremity edema.  Respiratory: Clear to auscultation bilaterally. Not using accessory muscles, speaking in full sentences. Left shoulder: Inspection reveals no abnormalities, atrophy or asymmetry. Palpation is normal with no tenderness over AC joint or  bicipital groove. ROM is full in all planes. Rotator cuff strength normal throughout. Positive Neer and Hawkin's tests, empty can. Speeds and Yergason's tests normal. No labral pathology noted with negative Obrien's, negative crank, negative clunk, and good stability. Normal scapular function observed. No painful arc and no drop arm sign. No apprehension sign  Procedure: Real-time Ultrasound Guided injection of the left subacromial bursa Device: Samsung HS60  Verbal informed consent obtained.  Time-out conducted.  Noted no overlying erythema, induration, or other signs of local infection.  Skin prepped in a sterile fashion.  Local anesthesia: Topical Ethyl chloride.  With sterile technique and under real time ultrasound guidance: 1 cc Kenalog 40, 1 cc lidocaine, 1 cc bupivacaine injected easily Completed without difficulty  Pain immediately resolved suggesting accurate placement of the medication.  Advised to call if fevers/chills, erythema, induration, drainage, or persistent bleeding.  Images permanently stored and available for review in the ultrasound unit.  Impression: Technically successful ultrasound guided injection.  Impression and Recommendations:    Tendinopathy of rotator cuff, left Adhesive capsulitis has resolved, symptoms are more impingement and related to rotator cuff tendinopathy. Subacromial injection today, added formal PT down in [place]. She has been honest about not really doing her rehabilitation. Supraspinatus is very weak. We can touch base in 6 weeks if not doing better.   ___________________________________________ Gwen Her. Dianah Field, M.D., ABFM., CAQSM. Primary Care and Sports Medicine [facility]  Adjunct Professor of [place] [employer] [facility] [employer]

## 2018-12-06 NOTE — Assessment & Plan Note (Signed)
Follows with psychiatry and sees a therapist.  If all the lab work comes back reassuring recommend that she also speak with them.  I definitely sounds like she still having some significant depression symptoms.  PHQ-9 score was 9 today and GAD-7 score of 5.  She rates her symptoms as somewhere between somewhat difficult and very difficult.  She does have thoughts occasionally of feeling better off dead but no active suicidal plans.  Continue with fluoxetine and Wellbutrin for now.

## 2018-12-06 NOTE — Assessment & Plan Note (Signed)
Adhesive capsulitis has resolved, symptoms are more impingement and related to rotator cuff tendinopathy. Subacromial injection today, added formal PT down in [place]. She has been honest about not really doing her rehabilitation. Supraspinatus is very weak. We can touch base in 6 weeks if not doing better.

## 2018-12-10 LAB — CBC WITH DIFFERENTIAL/PLATELET
Absolute Monocytes: 351 cells/uL (ref 200–950)
Basophils Absolute: 12 cells/uL (ref 0–200)
Basophils Relative: 0.3 %
Eosinophils Absolute: 152 cells/uL (ref 15–500)
Eosinophils Relative: 3.9 %
HCT: 43.3 % (ref 35.0–45.0)
Hemoglobin: 14.7 g/dL (ref 11.7–15.5)
Lymphs Abs: 1143 cells/uL (ref 850–3900)
MCH: 32 pg (ref 27.0–33.0)
MCHC: 33.9 g/dL (ref 32.0–36.0)
MCV: 94.3 fL (ref 80.0–100.0)
MPV: 10.7 fL (ref 7.5–12.5)
Monocytes Relative: 9 %
Neutro Abs: 2243 cells/uL (ref 1500–7800)
Neutrophils Relative %: 57.5 %
Platelets: 224 10*3/uL (ref 140–400)
RBC: 4.59 10*6/uL (ref 3.80–5.10)
RDW: 12.6 % (ref 11.0–15.0)
Total Lymphocyte: 29.3 %
WBC: 3.9 10*3/uL (ref 3.8–10.8)

## 2018-12-10 LAB — SEDIMENTATION RATE: Sed Rate: 6 mm/h (ref 0–30)

## 2018-12-10 LAB — HIV ANTIBODY (ROUTINE TESTING W REFLEX): HIV 1&2 Ab, 4th Generation: NONREACTIVE

## 2018-12-10 LAB — VITAMIN B12: Vitamin B-12: 539 pg/mL (ref 200–1100)

## 2018-12-10 LAB — VITAMIN B1: Vitamin B1 (Thiamine): 13 nmol/L (ref 8–30)

## 2018-12-10 LAB — ZINC: Zinc: 84 ug/dL (ref 60–130)

## 2018-12-10 LAB — RPR: RPR Ser Ql: NONREACTIVE

## 2018-12-10 LAB — FERRITIN: Ferritin: 520 ng/mL — ABNORMAL HIGH (ref 16–232)

## 2018-12-10 LAB — TSH: TSH: 1.57 mIU/L (ref 0.40–4.50)

## 2018-12-11 ENCOUNTER — Encounter: Payer: Self-pay | Admitting: Family Medicine

## 2018-12-11 ENCOUNTER — Telehealth: Payer: Self-pay | Admitting: Family Medicine

## 2018-12-11 NOTE — Telephone Encounter (Signed)
error 

## 2019-01-27 ENCOUNTER — Telehealth: Payer: Self-pay | Admitting: Family Medicine

## 2019-01-27 NOTE — Telephone Encounter (Signed)
Call pt: I got her Neuro evaluation.  If she has any questions or wants to go over it together we can schedule a virtual, but If the pyshologist went over everything with her then we don't need to schedule anything unless she has any questions or would like to make changes to her medication.

## 2019-01-28 NOTE — Telephone Encounter (Signed)
Left message advising of recommendations.  

## 2019-09-11 ENCOUNTER — Telehealth: Payer: Self-pay

## 2019-09-11 DIAGNOSIS — J9859 Other diseases of mediastinum, not elsewhere classified: Secondary | ICD-10-CM

## 2019-09-11 NOTE — Telephone Encounter (Signed)
Shirley Moody has switched care to [facility] due to moving to [facility]. She wanted Dr Madilyn Fireman to refer her to a pulmonologist in [place], [place] or [place]. I advised her PCP is a [facility] P[facility] and they would be able to do the referral since they have all the notes and imaging. She will call them back to have them do the referral to our area.   CT - IMPRESSION:   A LARGE MEDIASTINAL MASS MEASURING UP TO 8.9 CM, INFILTRATING FAT AND CAUSING MASS EFFECT ON VESSELS DISCUSSED ABOVE, INSEPARABLE FROM THE MID ESOPHAGUS IS MOST CONSISTENT WITH NEOPLASM. BRONCHOSCOPIC BIOPSY RECOMMENDED.

## 2020-03-08 ENCOUNTER — Telehealth: Payer: Self-pay

## 2020-03-08 NOTE — Telephone Encounter (Signed)
Transition Care Management Unsuccessful Follow-up Telephone Call  Date of discharge and from where: [facility]   Attempts:  1st Attempt  Reason for unsuccessful TCM follow-up call:  Left voice message

## 2020-03-09 NOTE — Telephone Encounter (Signed)
Transition Care Management Unsuccessful Follow-up Telephone Call  Date of discharge and from where:  03/06/2020 from [facility]  Attempts:  2nd Attempt  Reason for unsuccessful TCM follow-up call:  Left voice message

## 2020-03-10 NOTE — Telephone Encounter (Signed)
Transition Care Management Follow-up Telephone Call  Date of discharge and from where: 03/06/2020 from [facility]  How have you been since you were released from the hospital? Pt states that she is doing well since she has been home. Pt does not have any questions at this time.   Any questions or concerns? No  Items Reviewed:  Did the pt receive and understand the discharge instructions provided? Yes   Medications obtained and verified? Yes   Other? No   Any new allergies since your discharge? No   Dietary orders reviewed? N/A  Do you have support at home? Yes   Functional Questionnaire: (I = Independent and D = Dependent) ADLs: I  Bathing/Dressing- I  Meal Prep- I  Eating- I  Maintaining continence- I  Transferring/Ambulation- I  Managing Meds- I   Follow up appointments reviewed:   PCP Hospital f/u appt confirmed? No    Specialist Hospital f/u appt confirmed? Yes  Scheduled to see Colette Ribas, MD on 03/15/2020 @ 09:45am.  Are transportation arrangements needed? No   If their condition worsens, is the pt aware to call PCP or go to the Emergency Dept.? Yes  Was the patient provided with contact information for the PCP's office or ED? Yes  Was to pt encouraged to call back with questions or concerns? Yes

## 2020-04-02 ENCOUNTER — Telehealth: Payer: Self-pay | Admitting: General Practice

## 2020-04-02 NOTE — Telephone Encounter (Signed)
Transition Care Management Unsuccessful Follow-up Telephone Call  Date of discharge and from where:  04/01/20 from [facility] system.  Attempts:  1st Attempt  Reason for unsuccessful TCM follow-up call:  Left voice message

## 2020-04-05 ENCOUNTER — Telehealth: Payer: Self-pay | Admitting: General Practice

## 2020-04-05 NOTE — Telephone Encounter (Addendum)
Transition Care Management Follow-up Telephone Call  Date of discharge and from where: [place] on 04/01/20 from [place]  How have you been since you were released from the hospital?  Did not answer  Any questions or concerns? Patient stated that she has a different primary care doctor in [place]. She doesn't have any questions or concerns at this time. Patient requested to have Dr. Gardiner Ramus name removed from the chart.

## 2020-08-02 ENCOUNTER — Telehealth: Payer: Self-pay

## 2020-08-02 NOTE — Telephone Encounter (Signed)
Call not completed. Pt no longer sees Dr. Madilyn Fireman. PCP has been updated.

## 2022-12-17 DEATH — deceased
